# Patient Record
Sex: Male | Born: 1950
Health system: Southern US, Community
[De-identification: ages and names within clinical notes are randomized; demographics above are authoritative.]

## PROBLEM LIST (undated history)

## (undated) DIAGNOSIS — N200 Calculus of kidney: Secondary | ICD-10-CM

## (undated) DIAGNOSIS — R05 Cough: Secondary | ICD-10-CM

## (undated) DIAGNOSIS — L919 Hypertrophic disorder of the skin, unspecified: Secondary | ICD-10-CM

## (undated) DIAGNOSIS — R7309 Other abnormal glucose: Secondary | ICD-10-CM

## (undated) DIAGNOSIS — M26609 Unspecified temporomandibular joint disorder, unspecified side: Secondary | ICD-10-CM

## (undated) DIAGNOSIS — IMO0002 Reserved for concepts with insufficient information to code with codable children: Secondary | ICD-10-CM

## (undated) DIAGNOSIS — IMO0001 Reserved for inherently not codable concepts without codable children: Secondary | ICD-10-CM

## (undated) DIAGNOSIS — R5383 Other fatigue: Secondary | ICD-10-CM

## (undated) DIAGNOSIS — K210 Gastro-esophageal reflux disease with esophagitis: Secondary | ICD-10-CM

## (undated) DIAGNOSIS — G4730 Sleep apnea, unspecified: Secondary | ICD-10-CM

## (undated) DIAGNOSIS — L909 Atrophic disorder of skin, unspecified: Secondary | ICD-10-CM

## (undated) DIAGNOSIS — E785 Hyperlipidemia, unspecified: Secondary | ICD-10-CM

## (undated) DIAGNOSIS — K219 Gastro-esophageal reflux disease without esophagitis: Secondary | ICD-10-CM

## (undated) DIAGNOSIS — R1319 Other dysphagia: Secondary | ICD-10-CM

## (undated) DIAGNOSIS — R5381 Other malaise: Secondary | ICD-10-CM

## (undated) DIAGNOSIS — T7840XA Allergy, unspecified, initial encounter: Secondary | ICD-10-CM

## (undated) DIAGNOSIS — G4733 Obstructive sleep apnea (adult) (pediatric): Secondary | ICD-10-CM

## (undated) DIAGNOSIS — I10 Essential (primary) hypertension: Secondary | ICD-10-CM

## (undated) DIAGNOSIS — E119 Type 2 diabetes mellitus without complications: Secondary | ICD-10-CM

## (undated) DIAGNOSIS — G4700 Insomnia, unspecified: Secondary | ICD-10-CM

## (undated) DIAGNOSIS — L719 Rosacea, unspecified: Secondary | ICD-10-CM

## (undated) DIAGNOSIS — K760 Fatty (change of) liver, not elsewhere classified: Secondary | ICD-10-CM

## (undated) HISTORY — PX: TONSILLECTOMY AND ADENOIDECTOMY: SUR1326

## (undated) HISTORY — DX: Hyperlipidemia, unspecified: E78.5

## (undated) HISTORY — DX: Atrophic disorder of skin, unspecified: L90.9

## (undated) HISTORY — DX: Unspecified temporomandibular joint disorder, unspecified side: M26.609

## (undated) HISTORY — DX: Other abnormal glucose: R73.09

## (undated) HISTORY — PX: COLONOSCOPY: SHX174

## (undated) HISTORY — DX: Obstructive sleep apnea (adult) (pediatric): G47.33

## (undated) HISTORY — DX: Calculus of kidney: N20.0

## (undated) HISTORY — DX: Essential (primary) hypertension: I10

## (undated) HISTORY — DX: Insomnia, unspecified: G47.00

## (undated) HISTORY — DX: Allergy, unspecified, initial encounter: T78.40XA

## (undated) HISTORY — DX: Hypertrophic disorder of the skin, unspecified: L91.9

## (undated) HISTORY — PX: KIDNEY STONE SURGERY: SHX686

## (undated) HISTORY — DX: Sleep apnea, unspecified: G47.30

## (undated) HISTORY — DX: Gastro-esophageal reflux disease with esophagitis: K21.0

## (undated) HISTORY — DX: Rosacea, unspecified: L71.9

## (undated) HISTORY — DX: Other malaise: R53.81

## (undated) HISTORY — DX: Type 2 diabetes mellitus without complications: E11.9

## (undated) HISTORY — DX: Other fatigue: R53.83

## (undated) HISTORY — DX: Cough: R05

## (undated) HISTORY — DX: Gastro-esophageal reflux disease without esophagitis: K21.9

## (undated) HISTORY — DX: Fatty (change of) liver, not elsewhere classified: K76.0

## (undated) HISTORY — DX: Reserved for concepts with insufficient information to code with codable children: IMO0002

## (undated) HISTORY — PX: ESOPHAGOGASTRODUODENOSCOPY: SHX1529

## (undated) HISTORY — DX: Reserved for inherently not codable concepts without codable children: IMO0001

## (undated) HISTORY — DX: Other dysphagia: R13.19

---

## 2002-06-30 ENCOUNTER — Encounter: Admission: RE | Admit: 2002-06-30 | Discharge: 2002-06-30 | Payer: Self-pay | Admitting: Emergency Medicine

## 2002-06-30 ENCOUNTER — Encounter: Payer: Self-pay | Admitting: Emergency Medicine

## 2004-01-31 ENCOUNTER — Encounter: Payer: Self-pay | Admitting: Pulmonary Disease

## 2004-01-31 ENCOUNTER — Ambulatory Visit (HOSPITAL_BASED_OUTPATIENT_CLINIC_OR_DEPARTMENT_OTHER): Admission: RE | Admit: 2004-01-31 | Discharge: 2004-01-31 | Payer: Self-pay | Admitting: Internal Medicine

## 2004-05-26 ENCOUNTER — Ambulatory Visit (HOSPITAL_BASED_OUTPATIENT_CLINIC_OR_DEPARTMENT_OTHER): Admission: RE | Admit: 2004-05-26 | Discharge: 2004-05-26 | Payer: Self-pay | Admitting: Pulmonary Disease

## 2005-11-10 ENCOUNTER — Encounter: Admission: RE | Admit: 2005-11-10 | Discharge: 2005-11-10 | Payer: Self-pay | Admitting: Family Medicine

## 2005-11-20 ENCOUNTER — Encounter: Admission: RE | Admit: 2005-11-20 | Discharge: 2005-11-20 | Payer: Self-pay | Admitting: Family Medicine

## 2009-02-14 ENCOUNTER — Ambulatory Visit: Payer: Self-pay | Admitting: Family Medicine

## 2009-02-14 LAB — CONVERTED CEMR LAB
ALT: 51 units/L (ref 0–53)
Basophils Relative: 0.6 % (ref 0.0–3.0)
Bilirubin, Direct: 0 mg/dL (ref 0.0–0.3)
Chloride: 108 meq/L (ref 96–112)
Eosinophils Relative: 10.3 % — ABNORMAL HIGH (ref 0.0–5.0)
Glucose, Urine, Semiquant: NEGATIVE
HCT: 44.1 % (ref 39.0–52.0)
Hemoglobin: 15.5 g/dL (ref 13.0–17.0)
Lymphs Abs: 1.9 10*3/uL (ref 0.7–4.0)
MCV: 92.8 fL (ref 78.0–100.0)
Microalb Creat Ratio: 11.5 mg/g (ref 0.0–30.0)
Monocytes Absolute: 0.6 10*3/uL (ref 0.1–1.0)
Neutrophils Relative %: 50.2 % (ref 43.0–77.0)
PSA: 0.9 ng/mL (ref 0.10–4.00)
Potassium: 3.7 meq/L (ref 3.5–5.1)
RBC: 4.75 M/uL (ref 4.22–5.81)
Sodium: 142 meq/L (ref 135–145)
Total CHOL/HDL Ratio: 5
Total Protein: 6.6 g/dL (ref 6.0–8.3)
Urobilinogen, UA: 0.2
VLDL: 42 mg/dL — ABNORMAL HIGH (ref 0.0–40.0)
WBC: 6.5 10*3/uL (ref 4.5–10.5)

## 2009-02-21 ENCOUNTER — Ambulatory Visit: Payer: Self-pay | Admitting: Family Medicine

## 2009-02-21 DIAGNOSIS — N200 Calculus of kidney: Secondary | ICD-10-CM | POA: Insufficient documentation

## 2009-02-21 DIAGNOSIS — R7309 Other abnormal glucose: Secondary | ICD-10-CM

## 2009-02-21 DIAGNOSIS — L719 Rosacea, unspecified: Secondary | ICD-10-CM

## 2009-02-21 DIAGNOSIS — E785 Hyperlipidemia, unspecified: Secondary | ICD-10-CM | POA: Insufficient documentation

## 2009-02-21 DIAGNOSIS — IMO0002 Reserved for concepts with insufficient information to code with codable children: Secondary | ICD-10-CM

## 2009-02-21 DIAGNOSIS — I10 Essential (primary) hypertension: Secondary | ICD-10-CM | POA: Insufficient documentation

## 2009-02-21 HISTORY — DX: Rosacea, unspecified: L71.9

## 2009-02-21 HISTORY — DX: Calculus of kidney: N20.0

## 2009-02-21 HISTORY — DX: Other abnormal glucose: R73.09

## 2009-02-21 HISTORY — DX: Hyperlipidemia, unspecified: E78.5

## 2009-02-21 HISTORY — DX: Reserved for concepts with insufficient information to code with codable children: IMO0002

## 2009-02-22 ENCOUNTER — Encounter: Payer: Self-pay | Admitting: Family Medicine

## 2009-03-07 ENCOUNTER — Ambulatory Visit: Payer: Self-pay | Admitting: Family Medicine

## 2009-03-07 DIAGNOSIS — L919 Hypertrophic disorder of the skin, unspecified: Secondary | ICD-10-CM

## 2009-03-07 DIAGNOSIS — L909 Atrophic disorder of skin, unspecified: Secondary | ICD-10-CM | POA: Insufficient documentation

## 2009-03-28 ENCOUNTER — Telehealth: Payer: Self-pay | Admitting: *Deleted

## 2009-04-12 ENCOUNTER — Ambulatory Visit: Payer: Self-pay | Admitting: Family Medicine

## 2009-04-12 DIAGNOSIS — R059 Cough, unspecified: Secondary | ICD-10-CM | POA: Insufficient documentation

## 2009-04-12 DIAGNOSIS — R05 Cough: Secondary | ICD-10-CM

## 2009-04-12 HISTORY — DX: Cough, unspecified: R05.9

## 2009-05-13 ENCOUNTER — Ambulatory Visit: Payer: Self-pay | Admitting: Family Medicine

## 2009-05-13 DIAGNOSIS — IMO0001 Reserved for inherently not codable concepts without codable children: Secondary | ICD-10-CM | POA: Insufficient documentation

## 2009-05-13 DIAGNOSIS — K2100 Gastro-esophageal reflux disease with esophagitis, without bleeding: Secondary | ICD-10-CM

## 2009-05-13 HISTORY — DX: Reserved for inherently not codable concepts without codable children: IMO0001

## 2009-05-13 HISTORY — DX: Gastro-esophageal reflux disease with esophagitis, without bleeding: K21.00

## 2009-05-14 ENCOUNTER — Telehealth: Payer: Self-pay | Admitting: Internal Medicine

## 2009-05-15 ENCOUNTER — Ambulatory Visit: Payer: Self-pay | Admitting: Internal Medicine

## 2009-05-15 DIAGNOSIS — R1319 Other dysphagia: Secondary | ICD-10-CM

## 2009-05-15 HISTORY — DX: Other dysphagia: R13.19

## 2009-05-16 ENCOUNTER — Encounter: Payer: Self-pay | Admitting: Internal Medicine

## 2009-05-16 ENCOUNTER — Ambulatory Visit: Payer: Self-pay | Admitting: Internal Medicine

## 2009-05-23 ENCOUNTER — Encounter: Payer: Self-pay | Admitting: Internal Medicine

## 2009-06-06 ENCOUNTER — Ambulatory Visit: Payer: Self-pay | Admitting: Family Medicine

## 2009-06-06 LAB — CONVERTED CEMR LAB
BUN: 16 mg/dL (ref 6–23)
Chloride: 105 meq/L (ref 96–112)
Hgb A1c MFr Bld: 6.1 % (ref 4.6–6.5)
Potassium: 4.1 meq/L (ref 3.5–5.1)

## 2009-06-10 ENCOUNTER — Ambulatory Visit: Payer: Self-pay | Admitting: Family Medicine

## 2009-06-10 DIAGNOSIS — M26609 Unspecified temporomandibular joint disorder, unspecified side: Secondary | ICD-10-CM

## 2009-06-10 HISTORY — DX: Unspecified temporomandibular joint disorder, unspecified side: M26.609

## 2009-07-26 ENCOUNTER — Telehealth: Payer: Self-pay | Admitting: Family Medicine

## 2009-12-31 ENCOUNTER — Telehealth: Payer: Self-pay | Admitting: Family Medicine

## 2010-02-11 ENCOUNTER — Telehealth: Payer: Self-pay | Admitting: Family Medicine

## 2010-03-07 ENCOUNTER — Ambulatory Visit: Payer: Self-pay | Admitting: Family Medicine

## 2010-03-07 LAB — CONVERTED CEMR LAB
Alkaline Phosphatase: 67 units/L (ref 39–117)
BUN: 20 mg/dL (ref 6–23)
Basophils Relative: 0.9 % (ref 0.0–3.0)
Bilirubin Urine: NEGATIVE
Bilirubin, Direct: 0.1 mg/dL (ref 0.0–0.3)
Blood in Urine, dipstick: NEGATIVE
Calcium: 9.1 mg/dL (ref 8.4–10.5)
Chloride: 107 meq/L (ref 96–112)
Cholesterol: 161 mg/dL (ref 0–200)
Creatinine, Ser: 0.8 mg/dL (ref 0.4–1.5)
Direct LDL: 98.5 mg/dL
Eosinophils Absolute: 0.4 10*3/uL (ref 0.0–0.7)
Eosinophils Relative: 5.9 % — ABNORMAL HIGH (ref 0.0–5.0)
Glucose, Urine, Semiquant: NEGATIVE
HDL: 27.4 mg/dL — ABNORMAL LOW (ref >39.00)
Hgb A1c MFr Bld: 6.1 % (ref 4.6–6.5)
Ketones, urine, test strip: NEGATIVE
Lymphocytes Relative: 30.5 % (ref 12.0–46.0)
MCHC: 34.6 g/dL (ref 30.0–36.0)
MCV: 93.1 fL (ref 78.0–100.0)
Microalb Creat Ratio: 0.9 mg/g (ref 0.0–30.0)
Microalb, Ur: 1.1 mg/dL (ref 0.0–1.9)
Monocytes Absolute: 0.6 10*3/uL (ref 0.1–1.0)
Neutrophils Relative %: 53.2 % (ref 43.0–77.0)
PSA: 0.83 ng/mL (ref 0.10–4.00)
Platelets: 221 10*3/uL (ref 150.0–400.0)
Protein, U semiquant: NEGATIVE
RBC: 4.63 M/uL (ref 4.22–5.81)
Total Bilirubin: 0.9 mg/dL (ref 0.3–1.2)
Total CHOL/HDL Ratio: 6
Triglycerides: 268 mg/dL — ABNORMAL HIGH (ref 0.0–149.0)
Urobilinogen, UA: 0.2
VLDL: 53.6 mg/dL — ABNORMAL HIGH (ref 0.0–40.0)
WBC: 6.3 10*3/uL (ref 4.5–10.5)
pH: 5.5

## 2010-03-14 ENCOUNTER — Ambulatory Visit: Payer: Self-pay | Admitting: Family Medicine

## 2010-03-14 DIAGNOSIS — R5381 Other malaise: Secondary | ICD-10-CM

## 2010-03-14 DIAGNOSIS — R5383 Other fatigue: Secondary | ICD-10-CM

## 2010-03-14 HISTORY — DX: Other malaise: R53.81

## 2010-04-02 ENCOUNTER — Ambulatory Visit: Payer: Self-pay | Admitting: Pulmonary Disease

## 2010-04-02 DIAGNOSIS — G4733 Obstructive sleep apnea (adult) (pediatric): Secondary | ICD-10-CM

## 2010-04-02 DIAGNOSIS — G4700 Insomnia, unspecified: Secondary | ICD-10-CM

## 2010-04-02 HISTORY — DX: Insomnia, unspecified: G47.00

## 2010-04-02 HISTORY — DX: Obstructive sleep apnea (adult) (pediatric): G47.33

## 2010-04-08 ENCOUNTER — Telehealth (INDEPENDENT_AMBULATORY_CARE_PROVIDER_SITE_OTHER): Payer: Self-pay | Admitting: *Deleted

## 2010-04-23 ENCOUNTER — Encounter: Payer: Self-pay | Admitting: Pulmonary Disease

## 2010-05-07 ENCOUNTER — Encounter: Payer: Self-pay | Admitting: Pulmonary Disease

## 2010-06-06 ENCOUNTER — Ambulatory Visit: Payer: Self-pay | Admitting: Family Medicine

## 2010-07-08 ENCOUNTER — Encounter: Payer: Self-pay | Admitting: Pulmonary Disease

## 2010-08-22 ENCOUNTER — Ambulatory Visit: Payer: Self-pay | Admitting: Family Medicine

## 2010-08-22 LAB — CONVERTED CEMR LAB
Ketones, urine, test strip: NEGATIVE
Protein, U semiquant: >=300
Specific Gravity, Urine: >=1.03
Urobilinogen, UA: 0.2
pH: 6

## 2010-08-23 ENCOUNTER — Encounter: Payer: Self-pay | Admitting: Family Medicine

## 2010-08-25 ENCOUNTER — Telehealth: Payer: Self-pay | Admitting: Family Medicine

## 2010-09-03 ENCOUNTER — Telehealth: Payer: Self-pay | Admitting: Family Medicine

## 2010-11-18 NOTE — Letter (Signed)
SummaryScience writer Pulmonary Care Appointment Letter  [facility]  [address]   [place], [place] [address]   Phone: [phone]  Fax: [phone]    07/08/2010 MRN: [id number]  [facility] [address] [place], [place]  [address]  Dear Mr. Bree,   Our office is attempting to contact you about an appointment.  Please call our office at [phone] to schedule this appointment with Dr.__Sood__.  Our registration staff is prepared to assist you with any questions you may have.    Thank you,   Nature conservation officer Pulmonary Division

## 2010-11-18 NOTE — Assessment & Plan Note (Signed)
Summary: congestion-fever//ccm   Vital Signs:  Patient profile:   60 year old male Temp:     99.4 degrees F oral BP sitting:   140 / 80  (left arm) Cuff size:   large  Vitals Entered By: Sid Falcon LPN (August 22, 2010 2:24 PM)  History of Present Illness: Patient is seen with 2 day history of dysuria. Slow urinary stream. Intermittent sweats and low-grade fever. Some burning with urination. One episode of nonbloody diarrhea this morning. No nausea or vomiting. No penile discharge. CBG stable with fasting around 120. No history of BPH  does have history of prior kidney stones but no recent back pain.  Allergies (verified): No Known Drug Allergies  Past History:  Past Medical History: Last updated: 04/07/2010 Diabetes Hyperlipidemia Hypertension Kidney Stones OSA      - PSG 01/31/04 RDI 35, oxygen nadir 86% PMH reviewed for relevance  Review of Systems  The patient denies anorexia, weight loss, abdominal pain, hematuria, and incontinence.    Physical Exam  General:  Well-developed,well-nourished,in no acute distress; alert,appropriate and cooperative throughout examination Lungs:  Normal respiratory effort, chest expands symmetrically. Lungs are clear to auscultation, no crackles or wheezes. Heart:  normal rate and regular rhythm.   Prostate:  Prostate gland firm and smooth, no enlargement, nodularity, tenderness, mass, asymmetry or induration.   Impression & Recommendations:  Problem # 1:  DYSURIA (ICD-788.1)  doubt prostatitis clinically by exam. Suspect acute cystitis. Urine culture. Start Cipro 500 mg b.i.d. for 7 days  His updated medication list for this problem includes:    Ciprofloxacin Hcl 500 Mg Tabs (Ciprofloxacin hcl) ..... One by mouth two times a day for 7 days  Orders: UA Dipstick w/o Micro (manual) ([address]) T-Culture, Urine ([address]11)  Complete Medication List: 1)  Aspirin 81 Mg Tabs (Aspirin) .Marland Kitchen.. 1 by mouth daily 2)  Cozaar 100 Mg  Tabs (Losartan potassium) .... Take 1 tablet by mouth every morning 3)  Lipitor 20 Mg Tabs (Atorvastatin calcium) .... Take one tab by mouth at bedtime 4)  Fish Oil 1000 Mg Caps (Omega-3 fatty acids) .Marland Kitchen.. 1 by mouth daily 5)  Pantoprazole Sodium 40 Mg Tbec (Pantoprazole sodium) .Marland Kitchen.. 1 by mouth 30 mins before breakfast 6)  Glucophage 1000 Mg Tabs (Metformin hcl) .... Take 1 tablet by mouth two times a day 7)  Daily Mens Health Formula Tabs (Multiple vitamins-minerals) .Marland Kitchen.. 1 by mouth daily 8)  Loratadine 10 Mg Tabs (Loratadine) .Marland Kitchen.. 1 by mouth once daily 9)  Ambien 5 Mg Tabs (Zolpidem tartrate) .Marland Kitchen.. 1 tab @ bedtime as needed 10)  Freestyle Lite Test Strp (Glucose blood) .... Use to test glucose daily or as directed by your doctor 11)  Ciprofloxacin Hcl 500 Mg Tabs (Ciprofloxacin hcl) .... One by mouth two times a day for 7 days  Patient Instructions: 1)  Drink plenty of fluids up to 3-4 quarts a day. Cranberry juice is especially recommended in addition to large amounts of water. Avoid caffeine & carbonated drinks, they tend to irritate the bladder, Return in 3-5 days if you're not better: sooner if you're feeling worse.  Prescriptions: CIPROFLOXACIN HCL 500 MG TABS (CIPROFLOXACIN HCL) one by mouth two times a day for 7 days  #14 x 0   Entered and Authorized by:   Evelena Peat MD   Signed by:   Evelena Peat MD on 08/22/2010   Method used:   [facility] [address]  [phone]  [address] [place], [place]  [address]       Ph: [phone] or [phone]       Fax: [phone]   RxID:   [id number]    Orders Added: 1)  UA Dipstick w/o Micro (manual) [[address]1002] 2)  T-Culture, Urine [[id number]] 3)  Est. Patient Level III [[address]]    Laboratory Results   Urine Tests    Routine Urinalysis   Color: orange Appearance: Hazy Glucose: negative   (Normal Range: Negative) Bilirubin: negative   (Normal Range: Negative) Ketone: negative    (Normal Range: Negative) Spec. Gravity: >=1.030   (Normal Range: 1.003-1.035) Blood: large   (Normal Range: Negative) pH: 6.0   (Normal Range: 5.0-8.0) Protein: >=300   (Normal Range: Negative) Urobilinogen: 0.2   (Normal Range: 0-1) Nitrite: positive   (Normal Range: Negative) Leukocyte Esterace: trace   (Normal Range: Negative)    Comments: Sid Falcon LPN  August 22, 2010 2:50 PM

## 2010-11-18 NOTE — Miscellaneous (Signed)
Summary: auto CPAP download 04/22/10 to 04/23/10  Clinical Lists Changes Used on 2 of 2 nights with average 1hr 59 min.  Optimal pressure 12 cm with average AHI 9.  Did have airleak.  Will have my nurse call to see if patient is interested in following up for sleep apnea, and if so proceed with scheduling ROV to assess status.  Appended Document: auto CPAP download 04/22/10 to 04/23/10 LMOMTCB.  Appended Document: auto CPAP download 04/22/10 to 04/23/10 LMOMTCB.  Appended Document: auto CPAP download 04/22/10 to 04/23/10 LMOMTCB.  Appended Document: auto CPAP download 04/22/10 to 04/23/10 Letter mailed to pt's home address requesting that he call our office for follow-up appt with Dr. Craige Cotta.

## 2010-11-18 NOTE — Progress Notes (Signed)
Summary: medical supplier  Phone Note Call from Patient Call back at [phone]   Caller: Patient Call For: sood Summary of Call: pt would like local medical supplier , he does not want to go to [place] Initial call taken by: Rickard Patience,  April 08, 2010 1:29 PM  Follow-up for Phone Call        called and spoke with pt.  pt states he see VS for OSA.  Pt states he was recently set up with [employer] as his DME company  (see orders in EMR) .  Pt states this company is in [place] and pt states this is too far and perfers someone in [place] as close to [facility] as possible.  Will forward message to [facility] to address.  Aundra Millet Reynolds LPN  April 08, 2010 2:01 PM   Additional Follow-up for Phone Call Additional follow up Details #1::        lmtcb with millie/spoke to [facility] she will call this pt and talk to him about someone coming to him for cpap auto  Additional Follow-up by: Oneita Jolly,  April 08, 2010 4:54 PM

## 2010-11-18 NOTE — Progress Notes (Signed)
Summary: Pt lost script for Cipro. Pls call in #18   Phone Note Call from Patient   Caller: spouse - pam Summary of Call: Pt script for cipro. [facility]s [facility]  Initial call taken by: Lucy Antigua,  September 03, 2010 3:31 PM  Follow-up for Phone Call        Cipro 500 mg, dispensed 20 tabs, directions one p.o. b.i.d. to a bottle empty.  No refills Follow-up by: Roderick Pee MD,  September 04, 2010 10:25 AM

## 2010-11-18 NOTE — Progress Notes (Signed)
Summary: shoulder concerns  Phone Note Call from Patient   Caller: Patient Summary of Call: patient is calling because he went to dr whitfield and he was dx a "frozen shoulder" after a MRI.  the steroid shot did not help.  his 2 options are 1- "be put to sleep and have the shoulder break loose"  or 2 - physical therapy.  he would like to know what dr Jr Milliron suggests.  his number is [phone] Initial call taken by: Kern Reap CMA Duncan Dull),  February 11, 2010 4:28 PM  Follow-up for Phone Call        try physical therapy first Follow-up by: Roderick Pee MD,  February 12, 2010 8:30 AM  Additional Follow-up for Phone Call Additional follow up Details #1::        Phone Call Completed Additional Follow-up by: Kern Reap CMA Duncan Dull),  February 12, 2010 11:49 AM

## 2010-11-18 NOTE — Progress Notes (Signed)
Summary: wants to speak with Fleet Contras  Call back at [phone]   Caller: Patient---live call Reason for Call: Talk to Nurse Summary of Call: pt is having a problem with his arm and wants Fleet Contras to return call. No further message. Refused to speak with the Triage Nurse. Initial call taken by: Warnell Forester,  December 31, 2009 11:02 AM  Follow-up for Phone Call        patient is complaining of pain.  no all the time and not with lifiting.  it only hurts when moving a certain way.  he's not sure if it is the medication.  any suggestions? Follow-up by: Kern Reap CMA Duncan Dull),  December 31, 2009 1:07 PM    Additional Follow-up for Phone Call Additional follow up Details #2::    referred to Dr. Cleophas Dunker for orthopedic evaluation Follow-up by: Roderick Pee MD,  December 31, 2009 1:30 PM

## 2010-11-18 NOTE — Progress Notes (Signed)
Summary: cipro refill  Phone Note Refill Request Message from:  Fax from Pharmacy on August 25, 2010 5:19 PM  Refills Requested: Medication #1:  CIPROFLOXACIN HCL 500 MG TABS one by mouth two times a day for 7 days. Initial call taken by: Kern Reap CMA Duncan Dull),  August 25, 2010 5:19 PM    Prescriptions: CIPROFLOXACIN HCL 500 MG TABS (CIPROFLOXACIN HCL) one by mouth two times a day for 7 days  #28 x 0   Entered by:   Kern Reap CMA (AAMA)   Authorized by:   Roderick Pee MD   Signed by:   Kern Reap CMA (AAMA) on 08/25/2010   Method used:   [facility]        [facility]  [phone]       [address] [place], [place]  [address]       Ph: [phone] or [phone]       Fax: [phone]   RxID:   [id number]

## 2010-11-18 NOTE — Assessment & Plan Note (Signed)
Summary: CPX//SLM   Vital Signs:  Patient profile:   60 year old male Height:      72.5 inches Weight:      246 pounds BMI:     33.02 Temp:     97.9 degrees F oral BP sitting:   160 / 88  (left arm) Cuff size:   regular  Vitals Entered By: Kern Reap CMA Duncan Dull) (Mar 14, 2010 1:57 PM) CC: cpx   CC:  cpx.  History of Present Illness: Derrick Brown is a 60 year old, married male, nonsmoker, who comes in today for evaluation of hypertension, diabetes, and a new problem of daytime fatigue.  His diabetes is treated with Glucophage 1000 mg b.i.d. fasting blood sugar 135, however, hemoglobin A1c 6.1%.  He takes Cozaar 100 mg daily for hypertension.  BP at home 135/80.  He takes a PPI from Dr. Leone Payor for reflux esophagitis.  He also takes Ambien 5 mg one half tab nightly p.r.n. for sleep dysfunction, and Claritin OTC for allergic rhinitis.  He also takes 181 mg baby aspirin and Lipitor 20 mg Monday, and Friday.  Lipids are goal.  He states for the past couple months.  He said some daytime fatigue.  It typically occurs around 4 o'clock in the afternoon.  By the time he gets home at 7.  He has  to take a nap.  He then eats dinner goes to bed around 10 or 11 and states he sleeps all night.  About 4 to 5 years ago.  He had an evaluation and was told he had sleep apnea.  He is not been using his CPAP.  When he began to feel fatigued.  He thought that might be the culprit therefore restart his CPAP, however, it's not helped.  Tetanus 2007  Allergies: No Known Drug Allergies  Past History:  Past medical, surgical, family and social histories (including risk factors) reviewed, and no changes noted (except as noted below).  Past Medical History: Reviewed history from 05/15/2009 and no changes required. Diabetes Hyperlipidemia Hypertension Kidney Stones  Past Surgical History: Reviewed history from 02/21/2009 and no changes required. Kidney Stones removed  Family History: Reviewed history  from 02/21/2009 and no changes required. father died of an MI at age 47.  Mother in good health.  One brother in good health.  One sister with colon polyps  Social History: Reviewed history from 05/15/2009 and no changes required. Married Former Smoker Alcohol use-yes Drug use-no Regular exercise-yes Occupation:  Musician  Review of Systems      See HPI  Physical Exam  General:  Well-developed,well-nourished,in no acute distress; alert,appropriate and cooperative throughout examination Head:  Normocephalic and atraumatic without obvious abnormalities. No apparent alopecia or balding. Eyes:  No corneal or conjunctival inflammation noted. EOMI. Perrla. Funduscopic exam benign, without hemorrhages, exudates or papilledema. Vision grossly normal. Ears:  External ear exam shows no significant lesions or deformities.  Otoscopic examination reveals clear canals, tympanic membranes are intact bilaterally without bulging, retraction, inflammation or discharge. Hearing is grossly normal bilaterally. Nose:  External nasal examination shows no deformity or inflammation. Nasal mucosa are pink and moist without lesions or exudates. Mouth:  Oral mucosa and oropharynx without lesions or exudates.  Teeth in good repair. Neck:  No deformities, masses, or tenderness noted. Chest Wall:  No deformities, masses, tenderness or gynecomastia noted. Breasts:  No masses or gynecomastia noted Lungs:  Normal respiratory effort, chest expands symmetrically. Lungs are clear to auscultation, no crackles or wheezes. Heart:  Normal rate and regular rhythm. S1 and S2 normal without gallop, murmur, click, rub or other extra sounds. Abdomen:  Bowel sounds positive,abdomen soft and non-tender without masses, organomegaly or hernias noted. Rectal:  No external abnormalities noted. Normal sphincter tone. No rectal masses or tenderness. Genitalia:  Testes bilaterally descended without nodularity,  tenderness or masses. No scrotal masses or lesions. No penis lesions or urethral discharge. Prostate:  Prostate gland firm and smooth, no enlargement, nodularity, tenderness, mass, asymmetry or induration. Msk:  No deformity or scoliosis noted of thoracic or lumbar spine.   Pulses:  R and L carotid,radial,femoral,dorsalis pedis and posterior tibial pulses are full and equal bilaterally Extremities:  No clubbing, cyanosis, edema, or deformity noted with normal full range of motion of all joints.   Neurologic:  No cranial nerve deficits noted. Station and gait are normal. Plantar reflexes are down-going bilaterally. DTRs are symmetrical throughout. Sensory, motor and coordinative functions appear intact. Skin:  Intact without suspicious lesions or rashes Cervical Nodes:  No lymphadenopathy noted Axillary Nodes:  No palpable lymphadenopathy Inguinal Nodes:  No significant adenopathy Psych:  Cognition and judgment appear intact. Alert and cooperative with normal attention span and concentration. No apparent delusions, illusions, hallucinations   Problems:  Medical Problems Added: 1)  Dx of Fatigue  (ICD-780.79)  Impression & Recommendations:  Problem # 1:  HYPERTENSION NEC (ICD-997.91) Assessment Improved  Orders: EKG w/ Interpretation ([address])  Problem # 2:  HYPERGLYCEMIA (ICD-790.29) Assessment: Improved  His updated medication list for this problem includes:    Glucophage 1000 Mg Tabs (Metformin hcl) .Marland Kitchen... Take 1 tablet by mouth two times a day  Problem # 3:  HYPERLIPIDEMIA (ICD-272.4) Assessment: Improved  His updated medication list for this problem includes:    Lipitor 20 Mg Tabs (Atorvastatin calcium) .Marland Kitchen... Take one tab by mouth at bedtime  Orders: EKG w/ Interpretation ([address])  Problem # 4:  Preventive Health Care (ICD-V70.0) Assessment: Unchanged  Problem # 5:  FATIGUE (ICD-780.79) Assessment: New  Orders: Prescription Created Electronically [phone]) Pulmonary  Referral (Pulmonary)  Complete Medication List: 1)  Fish Oil Oil (Fish oil) 2)  Aspirin 81 Mg Tabs (Aspirin) .... On hold 3)  Daily Mens Health Formula Tabs (Multiple vitamins-minerals) 4)  Loratadine 10 Mg Tabs (Loratadine) .Marland Kitchen.. 1 by mouth once daily 5)  Glucophage 1000 Mg Tabs (Metformin hcl) .... Take 1 tablet by mouth two times a day 6)  Duac Cs 1-5 % Kit (Clindamycin-benzoyl per-cleans) .... Apply qhs 7)  Ambien 5 Mg Tabs (Zolpidem tartrate) .Marland Kitchen.. 1 tab @ bedtime as needed 8)  Freestyle Lite Test Strp (Glucose blood) .... Use to test glucose daily or as directed by your doctor 9)  Cozaar 100 Mg Tabs (Losartan potassium) .... Take 1 tablet by mouth every morning 10)  Pantoprazole Sodium 40 Mg Tbec (Pantoprazole sodium) .Marland Kitchen.. 1 by mouth 30 mins before breakfast 11)  Lipitor 20 Mg Tabs (Atorvastatin calcium) .... Take one tab by mouth at bedtime  Patient Instructions: 1)  check a blood sugar daily, around 4 p.m., fax me the data at [phone].  Next Friday.  Move your evening dose prior to your evening meal.  I will also request a consult from Dr. Stann Mainland concerning the sleep apnea issue. 2)  It is important that you exercise regularly at least 20 minutes 5 times a week. If you develop chest pain, have severe difficulty breathing, or feel very tired , stop exercising immediately and seek medical attention. 3)  Schedule a colonoscopy/sigmoidoscopy to help detect  colon cancer. 4)  Check your blood sugars regularly. If your readings are usually above : or below 70 you should contact our office. 5)  It is important that your Diabetic A1c level is checked every 3 months. 6)  See your eye doctor yearly to check for diabetic eye damage. 7)  Check your feet each night for sore areas, calluses or signs of infection. 8)  Check your Blood Pressure regularly. If it is above: you should make an appointment. 9)  Please schedule a follow-up appointment in 6 months.    250.00 10)  BMP prior to visit,  ICD-9: 11)  HbgA1C prior to visit, ICD-9: Prescriptions: AMBIEN 5 MG TABS (ZOLPIDEM TARTRATE) 1 tab @ bedtime as needed  #50 x 4   Entered and Authorized by:   Roderick Pee MD   Signed by:   Roderick Pee MD on 03/14/2010   Method used:   Print then Give to Patient   RxID:   [id number] LIPITOR 20 MG TABS (ATORVASTATIN CALCIUM) take one tab by mouth at bedtime  #100 x 3   Entered and Authorized by:   Roderick Pee MD   Signed by:   Roderick Pee MD on 03/14/2010   Method used:   [facility]        [facility]  [phone]* (retail)       [address] [place], [place]  [address]       Ph: [phone] or [phone]       Fax: [phone]   RxID:   [id number] COZAAR 100 MG TABS (LOSARTAN POTASSIUM) Take 1 tablet by mouth every morning  #100 x 3   Entered and Authorized by:   Roderick Pee MD   Signed by:   Roderick Pee MD on 03/14/2010   Method used:   [facility]        [facility]  [phone]* (retail)       [address] [place], [place]  [address]       Ph: [phone] or [phone]       Fax: [phone]   RxID:   [id number] FREESTYLE LITE TEST  STRP (GLUCOSE BLOOD) use to test glucose daily or as directed by your doctor  #100 x 3   Entered and Authorized by:   Roderick Pee MD   Signed by:   Roderick Pee MD on 03/14/2010   Method used:   [facility] to        [facility]  [phone]* (retail)       [address] [place], [place]  [address]       Ph: [phone] or [phone]       Fax: [phone]   RxID:   [id number] DUAC CS 1-5 % KIT (CLINDAMYCIN-BENZOYL PER-CLEANS) apply qhs  #45 gr. x 6   Entered and Authorized by:   Roderick Pee MD   Signed by:   Roderick Pee MD on 03/14/2010   Method used:   [facility]ronically to        [facility]  [phone]* (retail)       [address] [place], [place]  [address]       Ph: [phone] or [phone]       Fax: [phone]   RxID:    [id number] GLUCOPHAGE 1000 MG  TABS (METFORMIN HCL) Take 1 tablet by mouth two times a day  #200 x 3   Entered and Authorized by:   Roderick Pee MD   Signed by:   Roderick Pee MD on 03/14/2010   Method used:   [facility]ically to        [facility]  [phone]* (retail)       [address] [place], [place]  [address]       Ph: [phone] or [phone]       Fax: [phone]   RxID:   [id number]

## 2010-11-18 NOTE — Miscellaneous (Signed)
Summary: Polysomnogram  Clinical Lists Changes RDI 35, with oxygen nadir 86%.

## 2010-11-18 NOTE — Medication Information (Signed)
Summary: CPAP Supplies/[facility]  CPAP Supplies/[facility]   Imported By: Sherian Rein 05/13/2010 08:33:43  _____________________________________________________________________  External Attachment:    Type:   Image     Comment:   External Document

## 2010-11-18 NOTE — Assessment & Plan Note (Signed)
Summary: sleep apnea/apc   Visit Type:  Initial Consult Copy to:  Dr. Kelle Darting Primary Provider/Referring Provider:  Dr. Kelle Darting  CC:  Sleep consult.Derrick Brown  History of Present Illness: 60 yo male with sleep apnea.  He was diagnosed with sleep apnea in 2005.  He was started on CPAP, and this helped initially.  He had then lost about 20 lbs, and his sleep improved.  He was no longer snoring either.  As a result he stopped using CPAP, and had not used it for several years.  Since then his weight has increased again.  With the increase in his weight he started snoring again, and had more trouble with his sleep.  His wife commented that his breathing would get shallow at night.  He has also been getting sleepy in the late afternoon.  He still had his CPAP machine, so he started using this again about one month ago.  This has not helped as much as before.  He goes to bed at 10 pm, and will take 2.5 mg of ambien just before bed.  It takes about 30 minutes to fall asleep.  He wakes up once to use the bathroom.  If he does not use ambien he will have trouble falling back to sleep.  He gets out of bed at 7am.  He denies sleep walking, sleep talking, nightmares, or bruxism.  There is no history of restless legs. He denies sleep hallucinations, sleep paralysis, or cataplexy.  He does not use anything to help him stay awake.  There is no history of thyroid disease.  He has not had problems with his mood.  Epworth score is 6 out of 24.    Preventive Screening-Counseling & Management  Alcohol-Tobacco     Alcohol drinks/day: 2     Smoking Status: never  Current Medications (verified): 1)  Lipitor 20 Mg Tabs (Atorvastatin Calcium) .... Take One Tab By Mouth At Bedtime 2)  Cozaar 100 Mg Tabs (Losartan Potassium) .... Take 1 Tablet By Mouth Every Morning 3)  Fish Oil 1000 Mg Caps (Omega-3 Fatty Acids) .Derrick Brown.. 1 By Mouth Daily 4)  Aspirin 81 Mg  Tabs (Aspirin) .Derrick Brown.. 1 By Mouth Daily 5)  Glucophage 1000  Mg Tabs (Metformin Hcl) .... Take 1 Tablet By Mouth Two Times A Day 6)  Pantoprazole Sodium 40 Mg Tbec (Pantoprazole Sodium) .Derrick Brown.. 1 By Mouth 30 Mins Before Breakfast 7)  Daily Mens Health Formula  Tabs (Multiple Vitamins-Minerals) .Derrick Brown.. 1 By Mouth Daily 8)  Loratadine 10 Mg Tabs (Loratadine) .Derrick Brown.. 1 By Mouth Once Daily 9)  Ambien 5 Mg Tabs (Zolpidem Tartrate) .Derrick Brown.. 1 Tab @ Bedtime As Needed 10)  Freestyle Lite Test  Strp (Glucose Blood) .... Use To Test Glucose Daily or As Directed By Your Doctor  Allergies (verified): No Known Drug Allergies  Past History:  Past Medical History: Diabetes Hyperlipidemia Hypertension Kidney Stones OSA  Past Surgical History: Reviewed history from 02/21/2009 and no changes required. Kidney Stones removed  Family History: Reviewed history from 02/21/2009 and no changes required. Father died of an MI at age 43.  Mother in good health.  One brother in good health.  One sister with colon polyps  Social History: Reviewed history from 05/15/2009 and no changes required. Married Former Smoker Alcohol use-yes Drug use-no Regular exercise-yes Occupation:  Musician Smoking Status:  never Alcohol drinks/day:  2  Review of Systems  The patient denies shortness of breath with activity, shortness of breath at rest,  productive cough, non-productive cough, coughing up blood, chest pain, irregular heartbeats, acid heartburn, indigestion, loss of appetite, weight change, abdominal pain, difficulty swallowing, sore throat, tooth/dental problems, headaches, nasal congestion/difficulty breathing through nose, sneezing, itching, ear ache, anxiety, depression, hand/feet swelling, joint stiffness or pain, rash, change in color of mucus, and fever.    Vital Signs:  Patient profile:   60 year old male Height:      72.5 inches (184.15 cm) Weight:      250 pounds (113.64 kg) BMI:     33.56 O2 Sat:      96 % on Room air Temp:     98.3  degrees F (36.83 degrees C) oral Pulse rate:   83 / minute BP sitting:   114 / 80  (right arm) Cuff size:   large  Vitals Entered By: Michel Bickers CMA (April 02, 2010 4:08 PM)  O2 Sat at Rest %:  96 O2 Flow:  Room air CC: Sleep consult. Is Patient Diabetic? Yes Comments Medications reviewed. Daytime phone verified. Michel Bickers CMA  April 02, 2010 4:09 PM   Physical Exam  General:  normal appearance, healthy appearing, and obese.   Eyes:  PERRLA and EOMI.   Ears:  deformity of R pinna.   Nose:  no deformity, discharge, inflammation, or lesions Mouth:  MP 3, no exudate, decreased AP diameter Neck:  no JVD.   Chest Wall:  no deformities noted Lungs:  clear bilaterally to auscultation and percussion Heart:  regular rate and rhythm, S1, S2 without murmurs, rubs, gallops, or clicks Abdomen:  bowel sounds positive; abdomen soft and non-tender without masses, or organomegaly Msk:  no deformity or scoliosis noted with normal posture Pulses:  pulses normal Extremities:  no clubbing, cyanosis, edema, or deformity noted Neurologic:  CN II-XII grossly intact with normal reflexes, coordination, muscle strength and tone Cervical Nodes:  no significant adenopathy Psych:  alert and cooperative; normal mood and affect; normal attention span and concentration   Impression & Recommendations:  Problem # 1:  OBSTRUCTIVE SLEEP APNEA (ICD-327.23) He has a previous diagnosis of sleep apnea.  He was on CPAP therapy with initial improvement.  He had significant weight loss, and with this his sleep pattern improved.  As a result he stopped using CPAP.  Unfortunately, he regained his weight plus more, and with this his sleep problems have recurred.  He therefore likely has recurrence of his sleep apnea.  He started using his CPAP machine again empirically, but has been experiencing difficulty adjusting to his CPAP.  He does have a history of hypertension and diabetes.  I discussed with him how sleep  apnea can affect his health.  Driving precautions were reviewed.  Explained the importance of weight loss in relation to his sleep and his overall health.  Reviewed treatment options for his sleep apnea.    Will arrange for an auto-CPAP titration at home.  Explained to him that if this is unsuccessful he may need an in lab study.  Additionally, since his previous test was several years ago he may require repeat diagnostic testing.  This would be especially so if he wants to pursue the option of an oral appliance.  Will try to get a copy of his previous diagnostic study.  Problem # 2:  INSOMNIA (ICD-780.52) He has sleep maintenance insomnia, and has been on ambien chronically.  He may actually have sleep disruption related to untreated sleep apnea.    Will stabilize his therapy for sleep apnea, and then  re-assess whether he needs to be on chronic hypnotic therapy.  Medications Added to Medication List This Visit: 1)  Aspirin 81 Mg Tabs (Aspirin) .Derrick Brown.. 1 by mouth daily 2)  Fish Oil 1000 Mg Caps (Omega-3 fatty acids) .Derrick Brown.. 1 by mouth daily 3)  Daily Mens Health Formula Tabs (Multiple vitamins-minerals) .Derrick Brown.. 1 by mouth daily  Complete Medication List: 1)  Aspirin 81 Mg Tabs (Aspirin) .Derrick Brown.. 1 by mouth daily 2)  Cozaar 100 Mg Tabs (Losartan potassium) .... Take 1 tablet by mouth every morning 3)  Lipitor 20 Mg Tabs (Atorvastatin calcium) .... Take one tab by mouth at bedtime 4)  Fish Oil 1000 Mg Caps (Omega-3 fatty acids) .Derrick Brown.. 1 by mouth daily 5)  Pantoprazole Sodium 40 Mg Tbec (Pantoprazole sodium) .Derrick Brown.. 1 by mouth 30 mins before breakfast 6)  Glucophage 1000 Mg Tabs (Metformin hcl) .... Take 1 tablet by mouth two times a day 7)  Daily Mens Health Formula Tabs (Multiple vitamins-minerals) .Derrick Brown.. 1 by mouth daily 8)  Loratadine 10 Mg Tabs (Loratadine) .Derrick Brown.. 1 by mouth once daily 9)  Ambien 5 Mg Tabs (Zolpidem tartrate) .Derrick Brown.. 1 tab @ bedtime as needed 10)  Freestyle Lite Test Strp (Glucose blood) .... Use  to test glucose daily or as directed by your doctor  Other Orders: Consultation Level IV (04540) DME Referral (DME)  Patient Instructions: 1)  Will set up test of CPAP pressure at home 2)  Follow up in 6 to 8 weeks

## 2010-12-17 ENCOUNTER — Other Ambulatory Visit: Payer: Self-pay | Admitting: Family Medicine

## 2011-01-20 ENCOUNTER — Telehealth: Payer: Self-pay | Admitting: Family Medicine

## 2011-01-20 NOTE — Telephone Encounter (Signed)
Requesting a new rx for Pantoprazole [facility]mg  [facility].

## 2011-01-21 MED ORDER — PANTOPRAZOLE SODIUM 40 MG PO TBEC: 40.0000 mg | DELAYED_RELEASE_TABLET | Freq: Every day | ORAL | Status: DC

## 2011-01-25 LAB — GLUCOSE, CAPILLARY: Glucose-Capillary: 117 mg/dL — ABNORMAL HIGH (ref 70–99)

## 2011-03-06 NOTE — Procedures (Signed)
Derrick Brown, Derrick Brown                ACCOUNT NO.:  [id number]   MEDICAL RECORD NO.:  [id number]          PATIENT TYPE:  OUT   LOCATION:  SLEEP CENTER                 FACILITY:  [facility]   PHYSICIAN:  Marcelyn Bruins, M.D. [facility] DATE OF BIRTH:  1951-01-28   DATE OF ADMISSION:  05/26/2004  DATE OF DISCHARGE:  05/26/2004                              NOCTURNAL POLYSOMNOGRAM   REFERRING PHYSICIAN:  Dr. Marcelyn Bruins   INDICATION FOR THE STUDY:  Hypersomnia with sleep apnea.  The patient needs  CPAP titration for pressure optimization.   SLEEP ARCHITECTURE:  The patient had a total sleep time of 388 minutes with  adequate amounts of slow wave sleep and REM.  His sleep onset was fairly  quick at 6 minutes and his REM onset latency was 75 minutes.   IMPRESSION:  1. Good control of previously documented obstructive sleep apnea with     continuous positive airway pressure of 8 cm.  There was mild rapid eye     movement breakthrough toward the end of the study, therefore I would     recommend a final treatment pressure of 9-10 cm if tolerated.  The     patient tried a different full face mask in the form of a Teacher, music.  By the end of the study he preferred this to his home     mask.  2. No clinically significant cardiac arrhythmias.  3. Moderate to large numbers of periodic leg movements with mild sleep     disruption.  Clinical correlation is suggested.                                   ______________________________                                Marcelyn Bruins, M.D. [facility]     KC/MEDQ  D:  06/10/2004 10:14:01  T:  06/10/2004 13:23:29  Job:  [id number]

## 2011-03-20 ENCOUNTER — Other Ambulatory Visit (INDEPENDENT_AMBULATORY_CARE_PROVIDER_SITE_OTHER): Payer: BC Managed Care – PPO

## 2011-03-20 DIAGNOSIS — Z0000 Encounter for general adult medical examination without abnormal findings: Secondary | ICD-10-CM

## 2011-03-20 LAB — HEPATIC FUNCTION PANEL
Alkaline Phosphatase: 69 U/L (ref 39–117)
Bilirubin, Direct: 0.2 mg/dL (ref 0.0–0.3)

## 2011-03-20 LAB — LIPID PANEL
HDL: 30.8 mg/dL — ABNORMAL LOW (ref >39.00)
Total CHOL/HDL Ratio: 5
VLDL: 71.2 mg/dL — ABNORMAL HIGH (ref 0.0–40.0)

## 2011-03-20 LAB — CBC WITH DIFFERENTIAL/PLATELET
Basophils Relative: 0.7 % (ref 0.0–3.0)
Eosinophils Relative: 5.2 % — ABNORMAL HIGH (ref 0.0–5.0)
HCT: 42.9 % (ref 39.0–52.0)
Hemoglobin: 15.1 g/dL (ref 13.0–17.0)
Lymphocytes Relative: 35.5 % (ref 12.0–46.0)
Lymphs Abs: 2 10*3/uL (ref 0.7–4.0)
Monocytes Relative: 9.8 % (ref 3.0–12.0)
Neutro Abs: 2.8 10*3/uL (ref 1.4–7.7)
RBC: 4.67 Mil/uL (ref 4.22–5.81)
WBC: 5.7 10*3/uL (ref 4.5–10.5)

## 2011-03-20 LAB — POCT URINALYSIS DIPSTICK
Ketones, UA: NEGATIVE
Protein, UA: NEGATIVE
Spec Grav, UA: 1.015
Urobilinogen, UA: 0.2
pH, UA: 5

## 2011-03-20 LAB — BASIC METABOLIC PANEL
GFR: 129.46 mL/min (ref >60.00)
Glucose, Bld: 155 mg/dL — ABNORMAL HIGH (ref 70–99)
Potassium: 4.1 mEq/L (ref 3.5–5.1)
Sodium: 138 mEq/L (ref 135–145)

## 2011-03-20 LAB — PSA: PSA: 0.94 ng/mL (ref 0.10–4.00)

## 2011-03-25 ENCOUNTER — Encounter: Payer: Self-pay | Admitting: Family Medicine

## 2011-03-26 ENCOUNTER — Ambulatory Visit (INDEPENDENT_AMBULATORY_CARE_PROVIDER_SITE_OTHER): Payer: BC Managed Care – PPO | Admitting: Family Medicine

## 2011-03-26 ENCOUNTER — Encounter: Payer: Self-pay | Admitting: Family Medicine

## 2011-03-26 DIAGNOSIS — N529 Male erectile dysfunction, unspecified: Secondary | ICD-10-CM

## 2011-03-26 DIAGNOSIS — E785 Hyperlipidemia, unspecified: Secondary | ICD-10-CM

## 2011-03-26 DIAGNOSIS — R7309 Other abnormal glucose: Secondary | ICD-10-CM

## 2011-03-26 DIAGNOSIS — IMO0002 Reserved for concepts with insufficient information to code with codable children: Secondary | ICD-10-CM

## 2011-03-26 DIAGNOSIS — K2100 Gastro-esophageal reflux disease with esophagitis, without bleeding: Secondary | ICD-10-CM

## 2011-03-26 DIAGNOSIS — G4700 Insomnia, unspecified: Secondary | ICD-10-CM

## 2011-03-26 MED ORDER — SILDENAFIL CITRATE 50 MG PO TABS: 50.0000 mg | ORAL_TABLET | ORAL | Status: DC | PRN

## 2011-03-26 MED ORDER — METFORMIN HCL 1000 MG PO TABS: ORAL_TABLET | ORAL | Status: DC

## 2011-03-26 MED ORDER — ATORVASTATIN CALCIUM 20 MG PO TABS: 20.0000 mg | ORAL_TABLET | Freq: Every day | ORAL | Status: DC

## 2011-03-26 MED ORDER — LOSARTAN POTASSIUM 100 MG PO TABS: 100.0000 mg | ORAL_TABLET | Freq: Every day | ORAL | Status: DC

## 2011-03-26 MED ORDER — PANTOPRAZOLE SODIUM 40 MG PO TBEC: 40.0000 mg | DELAYED_RELEASE_TABLET | Freq: Every day | ORAL | Status: DC

## 2011-03-26 NOTE — Patient Instructions (Addendum)
Continue your current medications except stop the Ambien and uses Tylenol PM one at bedtime as needed for sleep dysfunction.  If you have the sensation of flushing in the late afternoon check y  blood sugar, if it's low and let us know.  We will call u   The report on your testosterone level.  Viagra 50 mg one half tablet one hour prior to sex with water  Return sometime in the next couple weeks for removal of the lesion.  On your right forehead

## 2011-03-26 NOTE — Progress Notes (Signed)
Subjective:    Patient ID: Derrick Brown, male    DOB: 04/13/1951, 60 y.o.   MRN: [id number]  HPIelmon  Is a 60 year old, married male, nonsmoker, who comes in today for general physical examination because of a history of underlying hypertension, diabetes, hyperlipidemia, reflux esophagitis.  Sleep dysfunction, and a new problem of erectile dysfunction.  His hypertension is treated with Cozaar 100 mg daily.  BP 135/85 at home.  Hyperlipidemia treated with Lipitor 20 mg Monday, and Friday.  Lipids are at goal.  His diabetes is treated with metformin 500 mg b.i.d. Fasting blood sugar 155.  A1c7  No hypoglycemia except he is having some flushing occasionally late afternoon.  Advised to check her blood sugar.  He takes protonic 40 mg daily for chronic reflux.  He takes Ambien p.r.n. For sleep dysfunction.  Advised to stop that and just take Tylenol PM.  Is also having trouble with decreased libido and sexual dysfunction.  Will get a testosterone level.  He also takes an aspirin tablet daily, Claritin OTC for allergic rhinitis.  He gets routine eye care, dental care    Review of Systems  Constitutional: Positive for fatigue.  HENT: Negative.   Eyes: Negative.   Respiratory: Negative.   Cardiovascular: Negative.   Gastrointestinal: Negative.   Genitourinary: Negative.   Musculoskeletal: Negative.   Skin: Negative.   Neurological: Negative.   Hematological: Negative.   Psychiatric/Behavioral: Negative.        Objective:   Physical Exam  Constitutional: He is oriented to person, place, and time. He appears well-developed and well-nourished.  HENT:  Head: Normocephalic and atraumatic.  Right Ear: External ear normal.  Left Ear: External ear normal.  Nose: Nose normal.  Mouth/Throat: Oropharynx is clear and moist.  Eyes: Conjunctivae and EOM are normal. Pupils are equal, round, and reactive to light.  Neck: Normal range of motion. Neck supple. No JVD present. No tracheal  deviation present. No thyromegaly present.  Cardiovascular: Normal rate, regular rhythm, normal heart sounds and intact distal pulses.  Exam reveals no gallop and no friction rub.   No murmur heard. Pulmonary/Chest: Effort normal and breath sounds normal. No stridor. No respiratory distress. He has no wheezes. He has no rales. He exhibits no tenderness.  Abdominal: Soft. Bowel sounds are normal. He exhibits no distension and no mass. There is no tenderness. There is no rebound and no guarding.  Genitourinary: Rectum normal, prostate normal and penis normal. Guaiac negative stool. No penile tenderness.  Musculoskeletal: Normal range of motion. He exhibits no edema and no tenderness.  Lymphadenopathy:    He has no cervical adenopathy.  Neurological: He is alert and oriented to person, place, and time. He has normal reflexes. No cranial nerve deficit. He exhibits normal muscle tone.  Skin: Skin is warm and dry. No rash noted. No erythema. No pallor.       Red lesion, right forehead, advised to return for removal  Psychiatric: He has a normal mood and affect. His behavior is normal. Judgment and thought content normal.          Assessment & Plan:  Healthy male.  Diabetes type II continue metformin 500 b.i.d. Follow-up A1c in 6 months.  Hyperlipidemia.  Continue Lipitor 20 mg Monday, and Friday, and 81-mg daily, aspirin.  Allergic rhinitis.  Continue over-the-counter Claritin.  Hypertension.  Continue Cozaar 100 mg daily.  Reflux esophagitis.  Continue Protonix 40 daily.  Sleep dysfunction.  Stop Ambien use OTC Tylenol PM.  Erectile dysfunction workup  with testosterone ,,,,,,,,,, Viagra

## 2011-04-10 ENCOUNTER — Encounter: Payer: Self-pay | Admitting: *Deleted

## 2011-04-10 ENCOUNTER — Telehealth: Payer: Self-pay | Admitting: *Deleted

## 2011-04-10 DIAGNOSIS — E349 Endocrine disorder, unspecified: Secondary | ICD-10-CM | POA: Insufficient documentation

## 2011-04-10 MED ORDER — TESTOSTERONE 12.5 MG/ACT (1%) TD GEL: 2.0000 | TRANSDERMAL | Status: DC

## 2011-04-10 NOTE — Telephone Encounter (Signed)
Message copied by Trenton Gammon on Fri Apr 10, 2011  9:12 AM ------      Message from: TODD, JEFFREY A      Created: Mon Apr 06, 2011  7:22 AM       Please call testosterone level, low, 161.  Range of normal is 350 to 890,,,,,,,, recommend he began androgel         Two pumps daily with follow-up office visit and testosterone level in 6 weeks

## 2011-04-10 NOTE — Telephone Encounter (Signed)
patient  Is aware and rx called in

## 2011-04-13 ENCOUNTER — Telehealth: Payer: Self-pay | Admitting: Family Medicine

## 2011-04-13 NOTE — Telephone Encounter (Signed)
Pt is sch for a1c on 09/25/11. Pt wants to know if he could get testornone taken then, or does he need to come in sooner. Also, pt is needing Dr Tawanna Cooler or nurse to call [facility] [address] re: testosterone script. Pt said that pharmacy will not fill script.

## 2011-04-13 NOTE — Telephone Encounter (Signed)
rx called in.  Lab added and patient is aware

## 2011-04-15 ENCOUNTER — Encounter: Payer: Self-pay | Admitting: Family Medicine

## 2011-04-15 ENCOUNTER — Ambulatory Visit (INDEPENDENT_AMBULATORY_CARE_PROVIDER_SITE_OTHER): Payer: BC Managed Care – PPO | Admitting: Family Medicine

## 2011-04-15 DIAGNOSIS — L989 Disorder of the skin and subcutaneous tissue, unspecified: Secondary | ICD-10-CM

## 2011-04-15 DIAGNOSIS — L570 Actinic keratosis: Secondary | ICD-10-CM

## 2011-04-15 NOTE — Progress Notes (Signed)
  Subjective:    Patient ID: Derrick Brown, male    DOB: 05-27-1951, 60 y.o.   MRN: [id number]  HPI Derrick Brown Is a delightful, 60 year old man male, nonsmoker, who comes in today for removal of an irritated, red lesion on his right for head.  After informed consent, the lesion was cleaned with alcohol 1% Xylocaine with epinephrine was used for anesthesia.  The lesion was excised with 2-mm margins.  Total 6 mm and the base was cauterized.  Band-Aid was applied.  The lesion was sent for pathologic analysis.  Patient tolerated procedure and no complaints.  Also, a skin tag was removed from his left axillary area gratis   Review of Systems Negative    Objective:   Physical Exam    Procedure see above    Assessment & Plan:  Probable actinic keratosis, sent for pathologic analysis

## 2011-04-23 NOTE — Progress Notes (Signed)
patient  Is aware 

## 2011-08-17 ENCOUNTER — Telehealth: Payer: Self-pay | Admitting: Family Medicine

## 2011-08-17 MED ORDER — TESTOSTERONE 20.25 MG/ACT (1.62%) TD GEL: 2.0000 | Freq: Every day | TRANSDERMAL | Status: DC

## 2011-08-17 NOTE — Telephone Encounter (Signed)
Pt is currently on Testosterone (ANDROGEL PUMP) 1.25 GM/ACT (1%) GEL and is requesting  to have it increased to Testosterone (ANDROGEL PUMP)  1.6 pt can get a discount on this if it is increased. Pt requesting to have a new rx sent in  [facility] [address]

## 2011-09-25 ENCOUNTER — Other Ambulatory Visit (INDEPENDENT_AMBULATORY_CARE_PROVIDER_SITE_OTHER): Payer: BC Managed Care – PPO

## 2011-09-25 DIAGNOSIS — N529 Male erectile dysfunction, unspecified: Secondary | ICD-10-CM

## 2011-09-25 DIAGNOSIS — R7309 Other abnormal glucose: Secondary | ICD-10-CM

## 2011-09-25 LAB — BASIC METABOLIC PANEL
CO2: 27 mEq/L (ref 19–32)
Chloride: 105 mEq/L (ref 96–112)
Creatinine, Ser: 0.7 mg/dL (ref 0.4–1.5)
Potassium: 3.9 mEq/L (ref 3.5–5.1)
Sodium: 140 mEq/L (ref 135–145)

## 2011-09-25 LAB — HEMOGLOBIN A1C: Hgb A1c MFr Bld: 6.8 % — ABNORMAL HIGH (ref 4.6–6.5)

## 2011-09-25 LAB — TESTOSTERONE: Testosterone: 287.27 ng/dL — ABNORMAL LOW (ref 350.00–890.00)

## 2011-11-12 ENCOUNTER — Telehealth: Payer: Self-pay | Admitting: Family Medicine

## 2011-11-12 NOTE — Telephone Encounter (Signed)
Pt called req refill of zolpidem (AMBIEN) [facility] tablet [facility] [address].

## 2011-11-18 MED ORDER — ZOLPIDEM TARTRATE 10 MG PO TABS: 10.0000 mg | ORAL_TABLET | Freq: Every evening | ORAL | Status: DC | PRN

## 2011-11-18 NOTE — Telephone Encounter (Signed)
rx called in

## 2011-12-11 ENCOUNTER — Other Ambulatory Visit: Payer: Self-pay | Admitting: Family Medicine

## 2012-04-11 ENCOUNTER — Other Ambulatory Visit: Payer: Self-pay | Admitting: Family Medicine

## 2012-04-28 ENCOUNTER — Other Ambulatory Visit: Payer: Self-pay | Admitting: Family Medicine

## 2012-07-01 ENCOUNTER — Other Ambulatory Visit (INDEPENDENT_AMBULATORY_CARE_PROVIDER_SITE_OTHER): Payer: BC Managed Care – PPO

## 2012-07-01 DIAGNOSIS — Z79899 Other long term (current) drug therapy: Secondary | ICD-10-CM

## 2012-07-01 DIAGNOSIS — Z0000 Encounter for general adult medical examination without abnormal findings: Secondary | ICD-10-CM

## 2012-07-01 LAB — BASIC METABOLIC PANEL
CO2: 27 mEq/L (ref 19–32)
Chloride: 101 mEq/L (ref 96–112)
Creatinine, Ser: 0.8 mg/dL (ref 0.4–1.5)
Potassium: 4.2 mEq/L (ref 3.5–5.1)
Sodium: 136 mEq/L (ref 135–145)

## 2012-07-01 LAB — CBC WITH DIFFERENTIAL/PLATELET
Basophils Absolute: 0.1 10*3/uL (ref 0.0–0.1)
Basophils Relative: 0.8 % (ref 0.0–3.0)
Eosinophils Absolute: 0.3 10*3/uL (ref 0.0–0.7)
HCT: 45 % (ref 39.0–52.0)
Hemoglobin: 15 g/dL (ref 13.0–17.0)
Lymphs Abs: 2.1 10*3/uL (ref 0.7–4.0)
MCHC: 33.4 g/dL (ref 30.0–36.0)
Monocytes Relative: 10.2 % (ref 3.0–12.0)
Neutro Abs: 3.9 10*3/uL (ref 1.4–7.7)
RDW: 13 % (ref 11.5–14.6)

## 2012-07-01 LAB — HEPATIC FUNCTION PANEL
AST: 29 U/L (ref 0–37)
Albumin: 4.1 g/dL (ref 3.5–5.2)
Total Bilirubin: 0.8 mg/dL (ref 0.3–1.2)

## 2012-07-01 LAB — LIPID PANEL: VLDL: 59.2 mg/dL — ABNORMAL HIGH (ref 0.0–40.0)

## 2012-07-01 LAB — POCT URINALYSIS DIPSTICK
Ketones, UA: NEGATIVE
Leukocytes, UA: NEGATIVE
Nitrite, UA: NEGATIVE
Protein, UA: NEGATIVE

## 2012-07-01 LAB — TSH: TSH: 2.51 u[IU]/mL (ref 0.35–5.50)

## 2012-07-01 LAB — PSA: PSA: 0.91 ng/mL (ref 0.10–4.00)

## 2012-07-01 LAB — LDL CHOLESTEROL, DIRECT: Direct LDL: 95.2 mg/dL

## 2012-07-04 ENCOUNTER — Encounter: Payer: Self-pay | Admitting: Internal Medicine

## 2012-07-07 ENCOUNTER — Ambulatory Visit (INDEPENDENT_AMBULATORY_CARE_PROVIDER_SITE_OTHER): Payer: BC Managed Care – PPO | Admitting: Family Medicine

## 2012-07-07 ENCOUNTER — Encounter: Payer: Self-pay | Admitting: Family Medicine

## 2012-07-07 VITALS — BP 160/90 | Temp 98.8°F | Ht 73.5 in | Wt 244.0 lb

## 2012-07-07 DIAGNOSIS — IMO0002 Reserved for concepts with insufficient information to code with codable children: Secondary | ICD-10-CM

## 2012-07-07 DIAGNOSIS — Z23 Encounter for immunization: Secondary | ICD-10-CM

## 2012-07-07 DIAGNOSIS — E1165 Type 2 diabetes mellitus with hyperglycemia: Secondary | ICD-10-CM | POA: Insufficient documentation

## 2012-07-07 DIAGNOSIS — K2100 Gastro-esophageal reflux disease with esophagitis, without bleeding: Secondary | ICD-10-CM

## 2012-07-07 DIAGNOSIS — E291 Testicular hypofunction: Secondary | ICD-10-CM

## 2012-07-07 DIAGNOSIS — E785 Hyperlipidemia, unspecified: Secondary | ICD-10-CM

## 2012-07-07 DIAGNOSIS — IMO0001 Reserved for inherently not codable concepts without codable children: Secondary | ICD-10-CM

## 2012-07-07 DIAGNOSIS — E119 Type 2 diabetes mellitus without complications: Secondary | ICD-10-CM | POA: Insufficient documentation

## 2012-07-07 DIAGNOSIS — E349 Endocrine disorder, unspecified: Secondary | ICD-10-CM

## 2012-07-07 DIAGNOSIS — G4700 Insomnia, unspecified: Secondary | ICD-10-CM

## 2012-07-07 MED ORDER — METFORMIN HCL 1000 MG PO TABS: ORAL_TABLET | ORAL | Status: DC

## 2012-07-07 MED ORDER — LOSARTAN POTASSIUM-HCTZ 100-25 MG PO TABS: 1.0000 | ORAL_TABLET | Freq: Every day | ORAL | Status: DC

## 2012-07-07 MED ORDER — ATORVASTATIN CALCIUM 20 MG PO TABS: 20.0000 mg | ORAL_TABLET | Freq: Every day | ORAL | Status: DC

## 2012-07-07 MED ORDER — ZOLPIDEM TARTRATE 5 MG PO TABS: ORAL_TABLET | ORAL | Status: DC

## 2012-07-07 MED ORDER — PANTOPRAZOLE SODIUM 40 MG PO TBEC: 40.0000 mg | DELAYED_RELEASE_TABLET | Freq: Every day | ORAL | Status: DC

## 2012-07-07 NOTE — Progress Notes (Signed)
Subjective:    Patient ID: Derrick Brown, male    DOB: 01/16/1951, 61 y.o.   MRN: [id number]  HPI [name] is a 61 year old married male nonsmoker who comes in today for evaluation of the metabolic syndrome  He takes Lipitor 20 mg Monday and Friday. He cannot tolerate it daily because it causes muscle and joint pain.  He takes Cozaar 100 mg daily however BP is not at goal. Today is 160/90  He takes metformin 500 mg twice daily however blood sugar is not at goal. Blood sugar today 193 A1c was not done.  He has allergic rhinitis for which she takes over-the-counter Claritin he also takes an aspirin tablet. He has a history of low testosterone. He was on a supplement but stopped it because he was having side effects.   he's concerned about his heart although he has no history of any angina. He does not exercise on a regular basis.  He also has a bump on his scalp he would like checked.  He gets routine eye care, dental care, colonoscopy due for followup, tetanus 2007, seasonal flu shot today   Review of Systems  Constitutional: Negative.   HENT: Negative.   Eyes: Negative.   Respiratory: Negative.   Cardiovascular: Negative.   Gastrointestinal: Negative.   Genitourinary: Negative.   Musculoskeletal: Negative.   Skin: Negative.   Neurological: Negative.   Hematological: Negative.   Psychiatric/Behavioral: Negative.        Objective:   Physical Exam  Constitutional: He is oriented to person, place, and time. He appears well-developed and well-nourished.  HENT:  Head: Normocephalic and atraumatic.  Right Ear: External ear normal.  Left Ear: External ear normal.  Nose: Nose normal.  Mouth/Throat: Oropharynx is clear and moist.  Eyes: Conjunctivae normal and EOM are normal. Pupils are equal, round, and reactive to light.  Neck: Normal range of motion. Neck supple. No JVD present. No tracheal deviation present. No thyromegaly present.  Cardiovascular: Normal rate, regular rhythm,  normal heart sounds and intact distal pulses.  Exam reveals no gallop and no friction rub.   No murmur heard.      No carotid or abdominal bruits peripheral pulses normal no neuropathy  Pulmonary/Chest: Effort normal and breath sounds normal. No stridor. No respiratory distress. He has no wheezes. He has no rales. He exhibits no tenderness.  Abdominal: Soft. Bowel sounds are normal. He exhibits no distension and no mass. There is no tenderness. There is no rebound and no guarding.  Genitourinary: Rectum normal, prostate normal and penis normal. Guaiac negative stool. No penile tenderness.  Musculoskeletal: Normal range of motion. He exhibits no edema and no tenderness.       Recent treatment by a podiatrist left fourth toe  Lymphadenopathy:    He has no cervical adenopathy.  Neurological: He is alert and oriented to person, place, and time. He has normal reflexes. No cranial nerve deficit. He exhibits normal muscle tone.  Skin: Skin is warm and dry. No rash noted. No erythema. No pallor.       Body skin exam normal he does have a marble-sized sebaceous cyst right posterior scalp however is not increasing in size nor has it become infected I would recommend we just observe that.  Psychiatric: He has a normal mood and affect. His behavior is normal. Judgment and thought content normal.          Assessment & Plan:  Healthy male  Metabolic syndrome continue current meds except change the Cozaar  to Hyzaar to see if we can't get his blood pressure the goal. Also increase metformin to 1000 mg the morning 500 prior to evening meal followup blood sugar and A1c in 3 months  Continue protonic for chronic reflux  Recommended over-the-counter melatonin for sleep dysfunction he is requesting Ambien  High risk for coronary artery disease asymptomatic stress test per patient request

## 2012-07-07 NOTE — Patient Instructions (Signed)
This stop the Cozaar and begin Hyzaar one tablet daily  Check your blood pressure daily in the morning and return in 4 weeks for followup with all your blood pressure readings and the device  Increase the Glucophage take a full tablet prior to breakfast continue a half a tablet prior to evening meal  Return in 3 months for followup ,,,,,,,,,,, labs one week prior  Over and above your work begin a walking program 15 minutes daily  We will get you set up in cardiology with Dr. Shirlee Latch for a cardiac stress test

## 2012-07-08 ENCOUNTER — Other Ambulatory Visit: Payer: Self-pay | Admitting: Family Medicine

## 2012-07-08 DIAGNOSIS — IMO0002 Reserved for concepts with insufficient information to code with codable children: Secondary | ICD-10-CM

## 2012-07-08 DIAGNOSIS — E785 Hyperlipidemia, unspecified: Secondary | ICD-10-CM

## 2012-07-17 ENCOUNTER — Encounter: Payer: Self-pay | Admitting: Family Medicine

## 2012-07-18 ENCOUNTER — Ambulatory Visit (INDEPENDENT_AMBULATORY_CARE_PROVIDER_SITE_OTHER): Payer: BC Managed Care – PPO | Admitting: Family Medicine

## 2012-07-18 ENCOUNTER — Encounter: Payer: Self-pay | Admitting: Family Medicine

## 2012-07-18 ENCOUNTER — Telehealth: Payer: Self-pay | Admitting: Family Medicine

## 2012-07-18 VITALS — BP 124/80

## 2012-07-18 DIAGNOSIS — T7840XA Allergy, unspecified, initial encounter: Secondary | ICD-10-CM | POA: Insufficient documentation

## 2012-07-18 DIAGNOSIS — Z888 Allergy status to other drugs, medicaments and biological substances status: Secondary | ICD-10-CM

## 2012-07-18 DIAGNOSIS — IMO0002 Reserved for concepts with insufficient information to code with codable children: Secondary | ICD-10-CM

## 2012-07-18 MED ORDER — PREDNISONE 20 MG PO TABS: ORAL_TABLET | ORAL | Status: DC

## 2012-07-18 MED ORDER — LOSARTAN POTASSIUM 100 MG PO TABS: 100.0000 mg | ORAL_TABLET | Freq: Every day | ORAL | Status: DC

## 2012-07-18 NOTE — Telephone Encounter (Signed)
Spoke with patient and appointment made today for 12:45

## 2012-07-18 NOTE — Patient Instructions (Addendum)
Stop the Hyzaar  Restart the Cozaar  Monitor your blood pressure as outlined  Take the prednisone as directed to get rid of the skin rash  Return when necessary

## 2012-07-18 NOTE — Telephone Encounter (Signed)
Caller: Pam/Spouse; Patient Name: Derrick Brown; PCP: Kelle Darting [facility]); Best Callback Phone Number: [phone].  Pt was given a new RX after physical on (9/19/3).  New RX was Losartan/HCTZ.  Pt began have leg itching, whelps and tiny red bumps.  Pt stopped taking Losartan/HCTZ and went back to just Losartan.  Pt has tried Benadryl and Hydrocortisone Cream.  Legs are still itching and still has tiny red bumps.  Pt is not with caller, he is at work.  Triaged patient per Rash Protocol.  Call Provider within 4 Hours Disposition for 'New onset of rash after beginning new prescribed, noprescribed or alternative/complementary medication'. OFFICE PLEASE FOLLOW UP WITH PATIENT REGARDING RASH AND MEDICATION. CALL PT AT [phone].  Caller states if MD wants pt to continue Losartan, he will need a refil.

## 2012-07-18 NOTE — Progress Notes (Signed)
  Subjective:    Patient ID: Derrick Brown, male    DOB: 1951/09/29, 61 y.o.   MRN: [id number]  HPI [name]  Is a 61 year old married male nonsmoker who comes in today for evaluation of a skin rash  Last week we switched his medication to Hyzaar. He was on Cozaar. 4 days after changing he noticed the gradual onset of itching on his lower extremities that evolved into a diffuse rash macular papular over the lower extremities and the arms and somewhat on the abdomen also. Thinking it was a medicine reaction he stopped the Hyzaar went back to the Cozaar but the rash has persisted. He has a history of allergic rhinitis and asthma and is been on allergy shots in the past but has had no history of any food allergies. No airway edema or shortness of breath   Review of Systems    general and dermatologic review of systems otherwise negative Objective:   Physical Exam  Well-developed well nourished male no acute distress examination of skin shows a diffuse macular papular lesions on the legs and the arms consistent with some type of a allergic reaction. Question the diuretic      Assessment & Plan:  Allergic reaction and question the diuretic plan go back to the losartan alone  Prednisone burst and taper to get rid of the rash returned.

## 2012-08-02 ENCOUNTER — Ambulatory Visit: Payer: BC Managed Care – PPO | Admitting: Family Medicine

## 2012-08-03 ENCOUNTER — Ambulatory Visit: Payer: BC Managed Care – PPO | Admitting: Cardiology

## 2012-08-04 ENCOUNTER — Ambulatory Visit: Payer: BC Managed Care – PPO | Admitting: Family Medicine

## 2012-09-01 ENCOUNTER — Encounter: Payer: Self-pay | Admitting: Internal Medicine

## 2012-09-02 ENCOUNTER — Encounter: Payer: Self-pay | Admitting: *Deleted

## 2012-09-05 ENCOUNTER — Ambulatory Visit (INDEPENDENT_AMBULATORY_CARE_PROVIDER_SITE_OTHER): Payer: BC Managed Care – PPO | Admitting: Cardiology

## 2012-09-05 ENCOUNTER — Encounter: Payer: Self-pay | Admitting: Cardiology

## 2012-09-05 VITALS — BP 150/88 | HR 73 | Ht 73.5 in | Wt 238.0 lb

## 2012-09-05 DIAGNOSIS — E785 Hyperlipidemia, unspecified: Secondary | ICD-10-CM

## 2012-09-05 DIAGNOSIS — E7800 Pure hypercholesterolemia, unspecified: Secondary | ICD-10-CM

## 2012-09-05 DIAGNOSIS — R0609 Other forms of dyspnea: Secondary | ICD-10-CM

## 2012-09-05 DIAGNOSIS — R0989 Other specified symptoms and signs involving the circulatory and respiratory systems: Secondary | ICD-10-CM

## 2012-09-05 DIAGNOSIS — I10 Essential (primary) hypertension: Secondary | ICD-10-CM

## 2012-09-05 DIAGNOSIS — R0600 Dyspnea, unspecified: Secondary | ICD-10-CM

## 2012-09-05 DIAGNOSIS — IMO0002 Reserved for concepts with insufficient information to code with codable children: Secondary | ICD-10-CM

## 2012-09-05 MED ORDER — ATORVASTATIN CALCIUM 20 MG PO TABS: ORAL_TABLET | ORAL | Status: DC

## 2012-09-05 MED ORDER — AMLODIPINE BESYLATE 5 MG PO TABS: 5.0000 mg | ORAL_TABLET | Freq: Every day | ORAL | Status: DC

## 2012-09-05 NOTE — Patient Instructions (Signed)
Take amlodipine 5mg  daily.  Increase lipitor to 20mg  every other day.   Take coenzyme Q10 200mg  daily. You can get this without a prescription.   Your physician has requested that you have an exercise tolerance test. For further information please visit [email or web address] Please also follow instruction sheet, as given.  Your physician recommends that you return for a FASTING lipid profile /liver profile in 2 months.   Your physician recommends that you schedule a follow-up appointment in: 2 months with Dr Shirlee Latch a few days after you have the fasting lipid /liver profile.  Marland Kitchen

## 2012-09-05 NOTE — Progress Notes (Signed)
Patient ID: Derrick Brown, male   DOB: 09/18/1951, 62 y.o.   MRN: [id number] PCP: Dr. Tawanna Cooler  61 yo with multiple cardiac risk factors including type II diabetes, hyperlipidemia, and HTN presents for cardiology evaluation.  His father and uncles additionally all had a history of CAD.  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  He is a nonsmoker.  He is taking atorvastatin twice a week because he has had myalgias when trying to take it daily.  BP is running high today and has been systolic in the 150s for the most part at home.  Dr. Tawanna Cooler recently added HCTZ to his regimen but he appears to have had a rash develop after starting this med so is now off it.    Patient has not had any chest pain or pressure.  His most strenuous activity is bird hunting (walking in fields often for long distances).  He does this 1-2 times a month in season.  Otherwise, his most activity is walking the car lot at work.  He does not get exertional dyspnea with these activities.  However, on occasion, he will get short of breath walking to his mailbox (a distance of about 50 yards).  This does not happen every time and he is unsure about what in particular triggers the dyspnea when it does occur.     Labs (9/13): K 4.2, creatinine 0.8, HDL 26, LDL 95, TGs 296, TSH normal  ECG: NSR, normal  PMH: 1. Type II diabetes 2. Hyperlipidemia: Myalgias when he take atorvastatin daily.  3. HTN: rash with HCTZ  SH: Nonsmoker.  Lives in [place].  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   FH: Father with MI at 80, 3 uncles with CABG or MIs.   ROS: All systems reviewed and negative except as per HPI.   Current Outpatient Prescriptions  Medication Sig Dispense Refill  . aspirin 81 MG tablet Take 81 mg by mouth daily.        Marland Kitchen atorvastatin (LIPITOR) 20 MG tablet 1 every other day  30 tablet  3  . FREESTYLE LITE test strip USE TO TEST GLUCOSE DAILY OR AS DIRECTED BY YOUR  DOCTOR  100 each  3  . loratadine (CLARITIN) 10 MG tablet Take 10 mg by mouth daily.        Marland Kitchen losartan (COZAAR) 100 MG tablet Take 1 tablet (100 mg total) by mouth daily.  100 tablet  3  . metFORMIN (GLUCOPHAGE) 1000 MG tablet One tablet before breakfast and a half a tablet before your evening meal  150 tablet  3  . Multiple Vitamin (MULTIVITAMIN) tablet Take 1 tablet by mouth daily.        . Omega-3 Fatty Acids (FISH OIL) 1000 MG CAPS Take by mouth daily.        . pantoprazole (PROTONIX) 40 MG tablet Take 1 tablet (40 mg total) by mouth daily.  100 tablet  3  . zolpidem (AMBIEN) 5 MG tablet One half tablet each bedtime when necessary  60 tablet  3  . [DISCONTINUED] atorvastatin (LIPITOR) 20 MG tablet Take 1 tablet (20 mg total) by mouth daily.  50 tablet  1  . [DISCONTINUED] atorvastatin (LIPITOR) 20 MG tablet Take 20 mg by mouth 2 (two) times a week.      Marland Kitchen amLODipine (NORVASC) 5 MG tablet Take 1 tablet (5 mg total) by mouth daily.  30 tablet  6  .  Coenzyme Q10 200 MG TABS Take 1 daily        BP 150/88  Pulse 73  Ht 6' 1.5" (1.867 m)  Wt 238 lb (107.956 kg)  BMI 30.97 kg/m2 General: NAD, overweight.  Neck: No JVD, no thyromegaly or thyroid nodule.  Lungs: Clear to auscultation bilaterally with normal respiratory effort. CV: Nondisplaced PMI.  Heart regular S1/S2, no S3/S4, no murmur.  No peripheral edema.  No carotid bruit.  Normal pedal pulses.  Abdomen: Soft, nontender, no hepatosplenomegaly, no distention.  Skin: Intact without lesions or rashes.  Neurologic: Alert and oriented x 3.  Psych: Normal affect. Extremities: No clubbing or cyanosis.  HEENT: Normal.   Assessment/Plan:  1. CAD risk: Patient has multiple risk factors for CAD including DM, hyperlipidemia, HTN, and family history (though not premature family history).  He is not a smoker.  He does not have much in terms of exertional symptoms: only occasional dypsnea when walking to his mailbox (he is able to do more heavy  activities without trouble, it seems).  - Continue ASA 81 mg daily - ECG is normal.  I will have him do an ETT.  If this is normal, continue aggressive preventive care.  2. HTN: BP is too high.  Continue losartan, add amlodipine 5 mg daily.   3. Hyperlipidemia: LDL 95 with HDL low and TGs high.  Given multiple risk factors and strong family history, I would like to try to be more aggressive with statin.  I will have him try to take atorvastatin every other day instead of twice a week to see if he can tolerate this.  He can take coenzyme Q10 200 mg daily along with the atorvastatin to see if that can limit myalgias.  Check lipids/LFTs in 2 months.  I have asked him for now to try to manage diet and exercise more to help with triglycerides and HDL.    Marca Ancona 09/05/2012 1:48 PM

## 2012-09-21 ENCOUNTER — Ambulatory Visit (INDEPENDENT_AMBULATORY_CARE_PROVIDER_SITE_OTHER): Payer: BC Managed Care – PPO | Admitting: Physician Assistant

## 2012-09-21 DIAGNOSIS — E7800 Pure hypercholesterolemia, unspecified: Secondary | ICD-10-CM

## 2012-09-21 DIAGNOSIS — R079 Chest pain, unspecified: Secondary | ICD-10-CM

## 2012-09-21 DIAGNOSIS — I10 Essential (primary) hypertension: Secondary | ICD-10-CM

## 2012-09-21 DIAGNOSIS — R0989 Other specified symptoms and signs involving the circulatory and respiratory systems: Secondary | ICD-10-CM

## 2012-09-21 NOTE — Progress Notes (Signed)
Exercise Treadmill Test  Pre-Exercise Testing Evaluation Rhythm: normal sinus  Rate: 84   PR:  .20 QRS:  .06  QT:  .36 QTc: .40     Test  Exercise Tolerance Test Ordering MD: Marca Ancona, MD  Interpreting MD: Tereso Newcomer, PA-C  Unique Test No: 1  Treadmill:  1  Indication for ETT: chest pain - rule out ischemia  Contraindication to ETT: No   Stress Modality: exercise - treadmill  Cardiac Imaging Performed: non   Protocol: standard Bruce - maximal  Max BP:  217/80  Max MPHR (bpm):  161 85% MPR (bpm):  137  MPHR obtained (bpm):  141 % MPHR obtained:  87%  Reached 85% MPHR (min:sec):  3:56 Total Exercise Time (min-sec):  4:03  Workload in METS:  5.8 Borg Scale: 13  Reason ETT Terminated:  Machine Malfunction; target HR achieved    ST Segment Analysis At Rest: normal ST segments - no evidence of significant ST depression With Exercise: no evidence of significant ST depression  Other Information Arrhythmia:  No Angina during ETT:  absent (0) Quality of ETT:  diagnostic  ETT Interpretation:  normal - no evidence of ischemia by ST analysis  Comments: Fair exercise tolerance. No chest pain. Accelerated BP response to exercise (pre-test 143/83 => 193/92 in 1st minute). Test stopped early due to machine malfunction. However, target HR achieved. Increased artifact noted. No ST changes noted to suggest ischemia.  Recommendations: Consider adjusting anti-hypertensive medication. Follow up with Dr. Marca Ancona as directed. Signed,  Tereso Newcomer, PA-C  10:31 AM 09/21/2012

## 2012-09-30 ENCOUNTER — Other Ambulatory Visit (INDEPENDENT_AMBULATORY_CARE_PROVIDER_SITE_OTHER): Payer: BC Managed Care – PPO

## 2012-09-30 DIAGNOSIS — R0609 Other forms of dyspnea: Secondary | ICD-10-CM

## 2012-09-30 DIAGNOSIS — E7800 Pure hypercholesterolemia, unspecified: Secondary | ICD-10-CM

## 2012-09-30 DIAGNOSIS — R0989 Other specified symptoms and signs involving the circulatory and respiratory systems: Secondary | ICD-10-CM

## 2012-09-30 DIAGNOSIS — E1165 Type 2 diabetes mellitus with hyperglycemia: Secondary | ICD-10-CM

## 2012-09-30 DIAGNOSIS — IMO0001 Reserved for inherently not codable concepts without codable children: Secondary | ICD-10-CM

## 2012-09-30 DIAGNOSIS — I10 Essential (primary) hypertension: Secondary | ICD-10-CM

## 2012-09-30 LAB — HEPATIC FUNCTION PANEL
ALT: 50 U/L (ref 0–53)
AST: 23 U/L (ref 0–37)
Albumin: 4.1 g/dL (ref 3.5–5.2)
Alkaline Phosphatase: 61 U/L (ref 39–117)
Total Bilirubin: 0.9 mg/dL (ref 0.3–1.2)

## 2012-09-30 LAB — BASIC METABOLIC PANEL
Calcium: 9.1 mg/dL (ref 8.4–10.5)
Creatinine, Ser: 0.7 mg/dL (ref 0.4–1.5)
GFR: 126.59 mL/min (ref >60.00)
Sodium: 136 mEq/L (ref 135–145)

## 2012-09-30 LAB — LIPID PANEL
HDL: 25.3 mg/dL — ABNORMAL LOW (ref >39.00)
Total CHOL/HDL Ratio: 5
Triglycerides: 179 mg/dL — ABNORMAL HIGH (ref 0.0–149.0)

## 2012-09-30 LAB — HEMOGLOBIN A1C: Hgb A1c MFr Bld: 7.6 % — ABNORMAL HIGH (ref 4.6–6.5)

## 2012-10-03 ENCOUNTER — Telehealth: Payer: Self-pay | Admitting: Cardiology

## 2012-10-03 NOTE — Telephone Encounter (Signed)
Spoke with pt about recent lab results 

## 2012-10-03 NOTE — Telephone Encounter (Signed)
plz return call to pt at [phone]

## 2012-10-17 ENCOUNTER — Ambulatory Visit (AMBULATORY_SURGERY_CENTER): Payer: BC Managed Care – PPO | Admitting: *Deleted

## 2012-10-17 ENCOUNTER — Encounter: Payer: Self-pay | Admitting: Internal Medicine

## 2012-10-17 ENCOUNTER — Ambulatory Visit: Payer: BC Managed Care – PPO | Admitting: Family Medicine

## 2012-10-17 VITALS — Ht 74.0 in | Wt 237.0 lb

## 2012-10-17 DIAGNOSIS — Z1211 Encounter for screening for malignant neoplasm of colon: Secondary | ICD-10-CM

## 2012-10-17 MED ORDER — SUPREP BOWEL PREP KIT 17.5-3.13-1.6 GM/177ML PO SOLN: ORAL | Status: DC

## 2012-10-31 ENCOUNTER — Encounter: Payer: Self-pay | Admitting: Internal Medicine

## 2012-10-31 ENCOUNTER — Ambulatory Visit (AMBULATORY_SURGERY_CENTER): Payer: BC Managed Care – PPO | Admitting: Internal Medicine

## 2012-10-31 VITALS — BP 114/69 | HR 63 | Temp 97.3°F | Resp 15 | Ht 74.0 in | Wt 237.0 lb

## 2012-10-31 DIAGNOSIS — D126 Benign neoplasm of colon, unspecified: Secondary | ICD-10-CM

## 2012-10-31 DIAGNOSIS — K5730 Diverticulosis of large intestine without perforation or abscess without bleeding: Secondary | ICD-10-CM

## 2012-10-31 DIAGNOSIS — Z1211 Encounter for screening for malignant neoplasm of colon: Secondary | ICD-10-CM

## 2012-10-31 MED ORDER — SODIUM CHLORIDE 0.9 % IV SOLN: 500.0000 mL | INTRAVENOUS | Status: DC

## 2012-10-31 NOTE — Op Note (Signed)
[facility] [address] [place], [address]   COLONOSCOPY PROCEDURE REPORT  PATIENT: Derrick Brown, Derrick Brown  MR#: [id number] BIRTHDATE: 20-Jan-1951 , 61  yrs. old GENDER: Male ENDOSCOPIST: Iva Boop, MD, [facility] PROCEDURE DATE:  10/31/2012 PROCEDURE:   Colonoscopy with snare polypectomy ASA CLASS:   Class II INDICATIONS:average risk screening. MEDICATIONS: propofol (Diprivan) 200mg  IV, MAC sedation, administered by CRNA, and These medications were titrated to patient response per physician's verbal order  DESCRIPTION OF PROCEDURE:   After the risks benefits and alternatives of the procedure were thoroughly explained, informed consent was obtained.  A digital rectal exam revealed no abnormalities of the rectum and A digital rectal exam revealed the prostate was not enlarged.   The LB [id number]  endoscope was introduced through the anus and advanced to the cecum, which was identified by both the appendix and ileocecal valve. No adverse events experienced.   The quality of the prep was Suprep excellent The instrument was then slowly withdrawn as the colon was fully examined.      COLON FINDINGS: A polypoid shaped sessile polyp measuring 4 mm in size was found in the descending colon.  A polypectomy was performed with a cold snare.  The resection was complete and the polyp tissue was completely retrieved.   Moderate diverticulosis was noted The finding was in the left colon.   There was mild scattered diverticulosis noted in the right colon.   The colon mucosa was otherwise normal.   A right colon retroflexion was performed.  Retroflexed views revealed no abnormalities. The time to cecum=1 minutes 50 seconds.  Withdrawal time=9 minutes 49 seconds.  The scope was withdrawn and the procedure completed. COMPLICATIONS: There were no complications.  ENDOSCOPIC IMPRESSION: 1.   Sessile polyp measuring 4 mm in size was found in the descending colon; polypectomy  was performed with a cold snare 2.   Moderate diverticulosis was noted in the left colon 3.   There was mild diverticulosis noted in the left colon 4.   The colon mucosa was otherwise normal - excellent prep  RECOMMENDATIONS: Timing of repeat colonoscopy will be determined by pathology findings.   eSigned:  Iva Boop, MD, [facility] 10/31/2012 9:35 AM cc: The Patient

## 2012-10-31 NOTE — Patient Instructions (Addendum)
One tiny polyp was removed - it should be benign.  You also have diverticulosis - a common condition that is not usually a problem. A handout will explain more.  I will let you know pathology results and when to have another routine colonoscopy by mail.  Thank you for choosing me and [facility].  Iva Boop, MD, FACG   YOU HAD AN ENDOSCOPIC PROCEDURE TODAY AT THE [facility]: Refer to the procedure report that was given to you for any specific questions about what was found during the examination.  If the procedure report does not answer your questions, please call your gastroenterologist to clarify.  If you requested that your care partner not be given the details of your procedure findings, then the procedure report has been included in a sealed envelope for you to review at your convenience later.  YOU SHOULD EXPECT: Some feelings of bloating in the abdomen. Passage of more gas than usual.  Walking can help get rid of the air that was put into your GI tract during the procedure and reduce the bloating. If you had a lower endoscopy (such as a colonoscopy or flexible sigmoidoscopy) you may notice spotting of blood in your stool or on the toilet paper. If you underwent a bowel prep for your procedure, then you may not have a normal bowel movement for a few days.  DIET: Your first meal following the procedure should be a light meal and then it is ok to progress to your normal diet.  A half-sandwich or bowl of soup is an example of a good first meal.  Heavy or fried foods are harder to digest and may make you feel nauseous or bloated.  Likewise meals heavy in dairy and vegetables can cause extra gas to form and this can also increase the bloating.  Drink plenty of fluids but you should avoid alcoholic beverages for 24 hours.  ACTIVITY: Your care partner should take you home directly after the procedure.  You should plan to take it easy, moving slowly for the rest of the  day.  You can resume normal activity the day after the procedure however you should NOT DRIVE or use heavy machinery for 24 hours (because of the sedation medicines used during the test).    SYMPTOMS TO REPORT IMMEDIATELY: A gastroenterologist can be reached at any hour.  During normal business hours, 8:30 AM to 5:00 PM Monday through Friday, call [phone].  After hours and on weekends, please call the GI answering service at [phone] who will take a message and have the physician on call contact you.   Following lower endoscopy (colonoscopy or flexible sigmoidoscopy):  Excessive amounts of blood in the stool  Significant tenderness or worsening of abdominal pains  Swelling of the abdomen that is new, acute  Fever of 100F or higher    FOLLOW UP: If any biopsies were taken you will be contacted by phone or by letter within the next 1-3 weeks.  Call your gastroenterologist if you have not heard about the biopsies in 3 weeks.  Our staff will call the home number listed on your records the next business day following your procedure to check on you and address any questions or concerns that you may have at that time regarding the information given to you following your procedure. This is a courtesy call and so if there is no answer at the home number and we have not heard from you through the emergency physician  on call, we will assume that you have returned to your regular daily activities without incident.  SIGNATURES/CONFIDENTIALITY: You and/or your care partner have signed paperwork which will be entered into your electronic medical record.  These signatures attest to the fact that that the information above on your After Visit Summary has been reviewed and is understood.  Full responsibility of the confidentiality of this discharge information lies with you and/or your care-partner.       Information on polyps & diverticulosis given to you today

## 2012-10-31 NOTE — Progress Notes (Signed)
Patient did not have preoperative order for IV antibiotic SSI prophylaxis. (G8918)  Patient did not experience any of the following events: a burn prior to discharge; a fall within the facility; wrong site/side/patient/procedure/implant event; or a hospital transfer or hospital admission upon discharge from the facility. (G8907)  

## 2012-10-31 NOTE — Progress Notes (Signed)
Called to room to assist during endoscopic procedure.  Patient ID and intended procedure confirmed with present staff. Received instructions for my participation in the procedure from the performing physician.  

## 2012-11-01 ENCOUNTER — Emergency Department (HOSPITAL_COMMUNITY)
Admission: EM | Admit: 2012-11-01 | Discharge: 2012-11-01 | Disposition: A | Discharge status: Home / Self Care | Payer: BC Managed Care – PPO | Attending: Emergency Medicine | Admitting: Emergency Medicine

## 2012-11-01 ENCOUNTER — Telehealth: Payer: Self-pay

## 2012-11-01 ENCOUNTER — Emergency Department (HOSPITAL_COMMUNITY): Payer: BC Managed Care – PPO

## 2012-11-01 ENCOUNTER — Telehealth: Payer: Self-pay | Admitting: Gastroenterology

## 2012-11-01 ENCOUNTER — Encounter (HOSPITAL_COMMUNITY): Payer: Self-pay | Admitting: *Deleted

## 2012-11-01 DIAGNOSIS — E785 Hyperlipidemia, unspecified: Secondary | ICD-10-CM | POA: Insufficient documentation

## 2012-11-01 DIAGNOSIS — Z9889 Other specified postprocedural states: Secondary | ICD-10-CM | POA: Insufficient documentation

## 2012-11-01 DIAGNOSIS — I10 Essential (primary) hypertension: Secondary | ICD-10-CM | POA: Insufficient documentation

## 2012-11-01 DIAGNOSIS — E119 Type 2 diabetes mellitus without complications: Secondary | ICD-10-CM | POA: Insufficient documentation

## 2012-11-01 DIAGNOSIS — R1110 Vomiting, unspecified: Secondary | ICD-10-CM | POA: Insufficient documentation

## 2012-11-01 DIAGNOSIS — Z87891 Personal history of nicotine dependence: Secondary | ICD-10-CM | POA: Insufficient documentation

## 2012-11-01 DIAGNOSIS — Z8719 Personal history of other diseases of the digestive system: Secondary | ICD-10-CM | POA: Insufficient documentation

## 2012-11-01 DIAGNOSIS — K219 Gastro-esophageal reflux disease without esophagitis: Secondary | ICD-10-CM | POA: Insufficient documentation

## 2012-11-01 DIAGNOSIS — Z79899 Other long term (current) drug therapy: Secondary | ICD-10-CM | POA: Insufficient documentation

## 2012-11-01 DIAGNOSIS — Z7982 Long term (current) use of aspirin: Secondary | ICD-10-CM | POA: Insufficient documentation

## 2012-11-01 DIAGNOSIS — Z87442 Personal history of urinary calculi: Secondary | ICD-10-CM | POA: Insufficient documentation

## 2012-11-01 DIAGNOSIS — Z8739 Personal history of other diseases of the musculoskeletal system and connective tissue: Secondary | ICD-10-CM | POA: Insufficient documentation

## 2012-11-01 DIAGNOSIS — G4700 Insomnia, unspecified: Secondary | ICD-10-CM | POA: Insufficient documentation

## 2012-11-01 LAB — URINALYSIS, ROUTINE W REFLEX MICROSCOPIC
Bilirubin Urine: NEGATIVE
Glucose, UA: NEGATIVE mg/dL
Hgb urine dipstick: NEGATIVE
Specific Gravity, Urine: >1.046 — ABNORMAL HIGH (ref 1.005–1.030)
Urobilinogen, UA: 0.2 mg/dL (ref 0.0–1.0)
pH: 6 (ref 5.0–8.0)

## 2012-11-01 LAB — CBC WITH DIFFERENTIAL/PLATELET
HCT: 42.6 % (ref 39.0–52.0)
Hemoglobin: 15.6 g/dL (ref 13.0–17.0)
Lymphocytes Relative: 3 % — ABNORMAL LOW (ref 12–46)
Lymphs Abs: 0.8 10*3/uL (ref 0.7–4.0)
Monocytes Relative: 7 % (ref 3–12)
Neutro Abs: 20.8 10*3/uL — ABNORMAL HIGH (ref 1.7–7.7)
Neutrophils Relative %: 89 % — ABNORMAL HIGH (ref 43–77)
RBC: 4.88 MIL/uL (ref 4.22–5.81)

## 2012-11-01 LAB — COMPREHENSIVE METABOLIC PANEL
Albumin: 4 g/dL (ref 3.5–5.2)
Alkaline Phosphatase: 73 U/L (ref 39–117)
BUN: 11 mg/dL (ref 6–23)
CO2: 25 mEq/L (ref 19–32)
Chloride: 99 mEq/L (ref 96–112)
GFR calc Af Amer: >90 mL/min (ref >90)
GFR calc non Af Amer: >90 mL/min (ref >90)
Glucose, Bld: 211 mg/dL — ABNORMAL HIGH (ref 70–99)
Potassium: 3.8 mEq/L (ref 3.5–5.1)
Total Bilirubin: 0.6 mg/dL (ref 0.3–1.2)

## 2012-11-01 LAB — LIPASE, BLOOD: Lipase: 9 U/L — ABNORMAL LOW (ref 11–59)

## 2012-11-01 MED ORDER — IOHEXOL 300 MG/ML  SOLN
100.0000 mL | Freq: Once | INTRAMUSCULAR | Status: AC | PRN
  Administered 2012-11-01: 100 mL via INTRAVENOUS

## 2012-11-01 MED ORDER — SODIUM CHLORIDE 0.9 % IV SOLN
INTRAVENOUS | Status: DC
  Administered 2012-11-01: 09:00:00 via INTRAVENOUS

## 2012-11-01 MED ORDER — HYDROMORPHONE HCL PF 1 MG/ML IJ SOLN
1.0000 mg | Freq: Once | INTRAMUSCULAR | Status: AC
  Administered 2012-11-01: 1 mg via INTRAVENOUS
  Filled 2012-11-01: qty 1

## 2012-11-01 MED ORDER — GI COCKTAIL ~~LOC~~
30.0000 mL | Freq: Once | ORAL | Status: AC
  Administered 2012-11-01: 30 mL via ORAL
  Filled 2012-11-01: qty 30

## 2012-11-01 MED ORDER — SUCRALFATE 1 G PO TABS
1.0000 g | ORAL_TABLET | Freq: Three times a day (TID) | ORAL | Status: DC
  Administered 2012-11-01: 1 g via ORAL
  Filled 2012-11-01 (×4): qty 1

## 2012-11-01 MED ORDER — ONDANSETRON HCL 4 MG/2ML IJ SOLN
4.0000 mg | Freq: Once | INTRAMUSCULAR | Status: AC
  Administered 2012-11-01: 4 mg via INTRAVENOUS
  Filled 2012-11-01: qty 2

## 2012-11-01 MED ORDER — SUCRALFATE 1 G PO TABS: 1.0000 g | ORAL_TABLET | Freq: Four times a day (QID) | ORAL | Status: DC

## 2012-11-01 NOTE — ED Notes (Signed)
Pt from home with reports of epigastric pain that started yesterday evening around 6pm after colonoscopy that was done yesterday morning by Dr. Stan Head. Pt reports that a small polyp was removed as well as mild diverticulosis. Pt reports calling on call MD this morning and was told that their may be a small tear and to come to ED, pt denies N/V/D or abnormal bleeding at present.

## 2012-11-01 NOTE — ED Notes (Signed)
Patient requesting additional pain meds- MD Freida Busman verbally notified

## 2012-11-01 NOTE — ED Provider Notes (Signed)
History     CSN: [id number]  Arrival date & time 11/01/12  4098   First MD Initiated Contact with Patient 11/01/12 0800      Chief Complaint  Patient presents with  . Abdominal Pain    epigastric    (Consider location/radiation/quality/duration/timing/severity/associated sxs/prior treatment) Patient is a 62 y.o. male presenting with abdominal pain. The history is provided by the patient and the spouse.  Abdominal Pain The primary symptoms of the illness include abdominal pain.   patient here complaining of upper quadrant and epigastric pain that started at 6 PM last night. Patient had a colonoscopy done yesterday morning. Pain started after he ate food and was associated with emesis x2. No further emesis since then. No black or bloody stools. His his emesis was nonbilious. No reported fevers. Pain is worse with movement and better with remaining still. Denies any urinary symptoms. No medications used prior to arrival  Past Medical History  Diagnosis Date  . ACNE ROSACEA 02/21/2009  . COUGH DUE TO ACE INHIBITORS 04/12/2009  . DYSPHAGIA 05/15/2009  . ESOPHAGITIS, REFLUX 05/13/2009  . FATIGUE 03/14/2010  . HYPERGLYCEMIA 02/21/2009  . HYPERLIPIDEMIA 02/21/2009  . HYPERTENSION NEC 02/21/2009  . INSOMNIA 04/02/2010  . MUSCLE PAIN 05/13/2009  . OBSTRUCTIVE SLEEP APNEA 04/02/2010  . RENAL CALCULUS 02/21/2009  . TEMPOROMANDIBULAR JOINT DISORDER 06/10/2009  . Unspecified hypertrophic and atrophic condition of skin   . GERD (gastroesophageal reflux disease)   . Diabetes mellitus without complication     type 2    Past Surgical History  Procedure Date  . Kidney stone surgery     removal  . Esophagogastroduodenoscopy   . Colonoscopy     Family History  Problem Relation Age of Onset  . Healthy Mother   . Heart attack Father 65  . Healthy Brother   . Colon polyps Sister   . Heart disease      family histrory    History  Substance Use Topics  . Smoking status: Former Games developer  . Smokeless  tobacco: Never Used  . Alcohol Use: 1.7 oz/week    2 Glasses of wine, 1 Drinks containing 0.5 oz of alcohol per week      Review of Systems  Gastrointestinal: Positive for abdominal pain.  All other systems reviewed and are negative.    Allergies  Hctz  Home Medications   Current Outpatient Rx  Name  Route  Sig  Dispense  Refill  . AMLODIPINE BESYLATE 5 MG PO TABS   Oral   Take 1 tablet (5 mg total) by mouth daily.   30 tablet   6   . ASPIRIN 81 MG PO TABS   Oral   Take 81 mg by mouth daily.           . ATORVASTATIN CALCIUM 20 MG PO TABS      1 every other day   30 tablet   3   . COENZYME Q10 200 MG PO TABS      Take 1 daily         . FREESTYLE LITE TEST VI STRP      USE TO TEST GLUCOSE DAILY OR AS DIRECTED BY YOUR DOCTOR   100 each   3   . LOSARTAN POTASSIUM 100 MG PO TABS   Oral   Take 1 tablet (100 mg total) by mouth daily.   100 tablet   3     Time for a yearly physical   . METFORMIN HCL 1000 MG  PO TABS      One tablet before breakfast and a half a tablet before your evening meal   150 tablet   3   . ONE-DAILY MULTI VITAMINS PO TABS   Oral   Take 1 tablet by mouth daily.           Marland Kitchen FISH OIL 1000 MG PO CAPS   Oral   Take by mouth daily.           Marland Kitchen PANTOPRAZOLE SODIUM 40 MG PO TBEC   Oral   Take 1 tablet (40 mg total) by mouth daily.   100 tablet   3   . ZOLPIDEM TARTRATE 5 MG PO TABS      One half tablet each bedtime when necessary   60 tablet   3     BP 108/75  Pulse 98  Temp 98.1 F (36.7 C) (Oral)  Resp 16  SpO2 98%  Physical Exam  Nursing note and vitals reviewed. Constitutional: He is oriented to person, place, and time. He appears well-developed and well-nourished.  Non-toxic appearance. No distress.  HENT:  Head: Normocephalic and atraumatic.  Eyes: Conjunctivae normal, EOM and lids are normal. Pupils are equal, round, and reactive to light.  Neck: Normal range of motion. Neck supple. No tracheal  deviation present. No mass present.  Cardiovascular: Regular rhythm and normal heart sounds.  Tachycardia present.  Exam reveals no gallop.   No murmur heard. Pulmonary/Chest: Effort normal and breath sounds normal. No stridor. No respiratory distress. He has no decreased breath sounds. He has no wheezes. He has no rhonchi. He has no rales.  Abdominal: Soft. Normal appearance and bowel sounds are normal. He exhibits no distension. There is tenderness in the right upper quadrant and right lower quadrant. There is guarding. There is no rebound and no CVA tenderness.    Musculoskeletal: Normal range of motion. He exhibits no edema and no tenderness.  Neurological: He is alert and oriented to person, place, and time. He has normal strength. No cranial nerve deficit or sensory deficit. GCS eye subscore is 4. GCS verbal subscore is 5. GCS motor subscore is 6.  Skin: Skin is warm and dry. No abrasion and no rash noted.  Psychiatric: He has a normal mood and affect. His speech is normal and behavior is normal.    ED Course  Procedures (including critical care time)   Labs Reviewed  CBC WITH DIFFERENTIAL  COMPREHENSIVE METABOLIC PANEL  LIPASE, BLOOD   No results found.   No diagnosis found.    MDM  Pt given meds and does so slightly better. Abdominal CT negative for free air. Patient symptoms started after he ate and he likely does have some reflux which he does have a history of. Will give patient medications for this and he will followup with his gi md        Toy Baker, MD 11/01/12 1218

## 2012-11-01 NOTE — Telephone Encounter (Signed)
I called to follow up. He is better and tolerated a diet. He is still sore in RUQ. Had a steak sandwich yesterday afternoon and vomited afterwards.  He will call if not better

## 2012-11-01 NOTE — Telephone Encounter (Signed)
Had colonoscopy yesterday.  Since then c/o bloating, nausea (vomited x 1) and worsening abdominal pain.  Small sessile polyp removed at colonoscopy 11/01/11. Instructed to go to ED for evaluation.

## 2012-11-01 NOTE — Telephone Encounter (Signed)
07:33 left a message at [phone] to call if he has any questions or concerns. Maw

## 2012-11-02 ENCOUNTER — Telehealth: Payer: Self-pay | Admitting: Internal Medicine

## 2012-11-02 LAB — URINE CULTURE
Colony Count: NO GROWTH
Culture: NO GROWTH

## 2012-11-02 NOTE — Telephone Encounter (Signed)
Patient had a colonoscopy on Monday and a ER visit that night for RUQ pain.  His wife reports that he is not feeling any better..  He has pain unless he is lying on his back.  No worse that it was at the ER.  They will come in and see Derrick Sou, PA on 11/03/12 at 8:30.  They know to call on call MD or ER if his symptoms worsen over night.  They will try warm or room temp liquids also this evening.

## 2012-11-03 ENCOUNTER — Ambulatory Visit (INDEPENDENT_AMBULATORY_CARE_PROVIDER_SITE_OTHER): Payer: BC Managed Care – PPO | Admitting: Gastroenterology

## 2012-11-03 ENCOUNTER — Encounter: Payer: Self-pay | Admitting: Internal Medicine

## 2012-11-03 ENCOUNTER — Other Ambulatory Visit (INDEPENDENT_AMBULATORY_CARE_PROVIDER_SITE_OTHER): Payer: BC Managed Care – PPO

## 2012-11-03 ENCOUNTER — Encounter: Payer: Self-pay | Admitting: Gastroenterology

## 2012-11-03 VITALS — BP 132/68 | HR 88 | Ht 74.0 in | Wt 227.0 lb

## 2012-11-03 DIAGNOSIS — R1011 Right upper quadrant pain: Secondary | ICD-10-CM | POA: Insufficient documentation

## 2012-11-03 DIAGNOSIS — R1013 Epigastric pain: Secondary | ICD-10-CM

## 2012-11-03 DIAGNOSIS — Z860100 Personal history of colon polyps, unspecified: Secondary | ICD-10-CM | POA: Insufficient documentation

## 2012-11-03 LAB — CBC WITH DIFFERENTIAL/PLATELET
Basophils Absolute: 0.2 10*3/uL — ABNORMAL HIGH (ref 0.0–0.1)
Eosinophils Absolute: 0.3 10*3/uL (ref 0.0–0.7)
Lymphocytes Relative: 10.1 % — ABNORMAL LOW (ref 12.0–46.0)
Monocytes Relative: 9.3 % (ref 3.0–12.0)
Neutrophils Relative %: 77.2 % — ABNORMAL HIGH (ref 43.0–77.0)
Platelets: 246 10*3/uL (ref 150.0–400.0)
RDW: 13.1 % (ref 11.5–14.6)

## 2012-11-03 LAB — HEPATIC FUNCTION PANEL
AST: 21 U/L (ref 0–37)
Alkaline Phosphatase: 78 U/L (ref 39–117)
Total Bilirubin: 1.6 mg/dL — ABNORMAL HIGH (ref 0.3–1.2)

## 2012-11-03 MED ORDER — HYDROCODONE-ACETAMINOPHEN 5-325 MG PO TABS: 1.0000 | ORAL_TABLET | Freq: Four times a day (QID) | ORAL | Status: DC | PRN

## 2012-11-03 MED ORDER — NAPROXEN 250 MG PO TABS: ORAL_TABLET | ORAL | Status: DC

## 2012-11-03 NOTE — Progress Notes (Signed)
Etiology of problem not clear. CT is reassuring Labs returned with improved WBC, mild unconjugated hyperbilirubinemia  Lab Results  Component Value Date   ALT 43 11/03/2012   AST 21 11/03/2012   ALKPHOS 78 11/03/2012   BILITOT 1.6* 11/03/2012   Lab Results  Component Value Date   WBC 15.4* 11/03/2012   HGB 15.2 11/03/2012   HCT 44.3 11/03/2012   MCV 91.1 11/03/2012   PLT 246.0 11/03/2012   Continue supportive care. He is improving.  Iva Boop, MD, [facility]

## 2012-11-03 NOTE — Progress Notes (Signed)
Quick Note:  WBC better but mildly elevated Mild elevation in bilirubin is not of concern at this point  Follow treatment plans and if not improving and sxs same may need an [place] of [place]  I would think he should be all better or close to that by Monday   ______

## 2012-11-03 NOTE — Patient Instructions (Addendum)
Please go to the basement level to have your labs drawn.  We faxed the Vicodin prescription to [facility] [address]. The Naproxen prescription was sent electronically.  We will call you with the lab results.  If you have any questions or problems do not hesitate to call us at [phone], choose option 2 for nurse .

## 2012-11-03 NOTE — Progress Notes (Signed)
11/03/2012 Derrick Brown [id number] 17-Jan-1951   History of Present Illness:  Patient is a pleasant 62 year old male who just underwent uneventful colonoscopy by Dr. Leone Payor on 1/13.  Since colonoscopy he has been experiencing abdominal pain that began in his epigastrium, but has now localized to the RUQ.  He had some nausea and one episode of vomiting the evening of the colonoscopy, but none since.  Has not been eating much because he does not feel well and does not have an appetite, but eating does not worsen the pain.  Pain is actually worsened by movement and lying on the right side.  He has not moved his bowels since the colonoscopy.  Never had similar pains prior to the colonoscopy.  Pain is described as severe at times, but when he is sitting still there is really no pain.  Report low-grade fever of 99's and some "cold sweats".  Went to ER where abdominal xray and CT scan of the abdomen and pelvis were unremarkable for perforation/free air or any other acute etiologies.  CBC, CMP, lipase, and urine study were all normal except for an elevated WBC count of 23.  Was given pain medication in the ED and sent home with carafate, which has not been making a difference.  Taking tylenol for pain at home with no relief.  Current Medications, Allergies, Past Medical History, Past Surgical History, Family History and Social History were reviewed in [employer] record.   Physical Exam: BP 132/68  Pulse 88  Ht 6\' 2"  (1.88 m)  Wt 227 lb (102.967 kg)  BMI 29.15 kg/m2 General: Well developed , white male in no acute distress Head: Normocephalic and atraumatic Eyes:  sclerae anicteric, conjunctiva pink  Ears: Normal auditory acuity Lungs: Clear throughout to auscultation Heart: Regular rate and rhythm Abdomen: Soft, non-distended.  BS present.  RUQ TTP without R/R/G.  Also some mild lower abdominal TTP. Rectal: Deferred. Musculoskeletal: Symmetrical with no gross deformities    Extremities: No edema  Neurological: Alert oriented x 4, grossly nonfocal Psychological:  Alert and cooperative. Normal mood and affect  Assessment and Recommendations: -RUQ abdominal pain:  Occurring since colonoscopy.  Worsened by movement.  ? Muscular etiology like abdominal wall strain vs gallbladder source.  Will repeat CBC since he did have an elevated WBC count on 1/14.  Also repeat LFT's.  Will treat with Naproxen twice daily for two weeks and vicodin as needed for severe pain over the next couple of days.  Discussed with Dr. Leone Payor.

## 2012-11-03 NOTE — Progress Notes (Signed)
Quick Note:  Diminutive adenoma Repeat colonoscopy 5 years (start thinking about it then) 10/2017 ______

## 2012-11-07 ENCOUNTER — Encounter: Payer: Self-pay | Admitting: Cardiology

## 2012-11-07 ENCOUNTER — Other Ambulatory Visit: Payer: Self-pay | Admitting: *Deleted

## 2012-11-07 ENCOUNTER — Ambulatory Visit (INDEPENDENT_AMBULATORY_CARE_PROVIDER_SITE_OTHER): Payer: BC Managed Care – PPO | Admitting: Cardiology

## 2012-11-07 ENCOUNTER — Other Ambulatory Visit (INDEPENDENT_AMBULATORY_CARE_PROVIDER_SITE_OTHER): Payer: BC Managed Care – PPO

## 2012-11-07 VITALS — BP 142/80 | HR 111 | Ht 74.0 in | Wt 230.8 lb

## 2012-11-07 DIAGNOSIS — E785 Hyperlipidemia, unspecified: Secondary | ICD-10-CM

## 2012-11-07 DIAGNOSIS — IMO0002 Reserved for concepts with insufficient information to code with codable children: Secondary | ICD-10-CM

## 2012-11-07 DIAGNOSIS — R0989 Other specified symptoms and signs involving the circulatory and respiratory systems: Secondary | ICD-10-CM

## 2012-11-07 DIAGNOSIS — G4700 Insomnia, unspecified: Secondary | ICD-10-CM

## 2012-11-07 DIAGNOSIS — I10 Essential (primary) hypertension: Secondary | ICD-10-CM

## 2012-11-07 LAB — LIPID PANEL
Cholesterol: 115 mg/dL (ref 0–200)
HDL: 15.6 mg/dL — ABNORMAL LOW (ref >39.00)
LDL Cholesterol: 70 mg/dL (ref 0–99)
VLDL: 29.2 mg/dL (ref 0.0–40.0)

## 2012-11-07 LAB — HEPATIC FUNCTION PANEL
Bilirubin, Direct: 0.1 mg/dL (ref 0.0–0.3)
Total Bilirubin: 0.5 mg/dL (ref 0.3–1.2)
Total Protein: 6.9 g/dL (ref 6.0–8.3)

## 2012-11-07 MED ORDER — AMLODIPINE BESYLATE 10 MG PO TABS: 10.0000 mg | ORAL_TABLET | Freq: Every day | ORAL | Status: DC

## 2012-11-07 NOTE — Patient Instructions (Addendum)
Increase amlodipine to 10mg  daily. You can take 2 of your 5mg  tablets daily at same time and use your current supply.  Your physician recommends that you have  lab work today--lipid profile /liver profile.  Your physician wants you to follow-up in: 1 year with Dr Shirlee Latch. Sherrie Mustache 2015). You will receive a reminder letter in the mail two months in advance. If you don't receive a letter, please call our office to schedule the follow-up appointment.

## 2012-11-07 NOTE — Progress Notes (Signed)
Patient ID: Derrick Brown, male   DOB: 1951-03-15, 62 y.o.   MRN: [id number] PCP: Dr. Tawanna Cooler  62 yo with multiple cardiac risk factors including type II diabetes, hyperlipidemia, and HTN returns for cardiology evaluation.  His father and uncles additionally all had a history of CAD.  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  He is a nonsmoker.  Patient has not had any chest pain or pressure.  His most strenuous activity is bird hunting (walking in fields often for long distances).  He does this 1-2 times a month in season.  Otherwise, his most activity is walking the car lot at work.  He does not get exertional dyspnea with these activities.  However, on occasion, he will get short of breath walking to his mailbox (a distance of about 50 yards). This does not happen every time and he is unsure about what in particular triggers the dyspnea when it does occur.     I had him do an ETT.  The treadmill malfunctioned so he was stopped early but did reach target heart rate.  No ECG changes but he did have a hypertensive blood pressure response.  He did not have chest pain. BP is still running high, 142/80 in the office today.  Main complaint recently has been RUQ pain that has been present since he had his colonoscopy. He has tolerated taking atorvastatin every other day.   Labs (9/13): K 4.2, creatinine 0.8, HDL 26, LDL 95, TGs 296, TSH normal  PMH: 1. Type II diabetes 2. Hyperlipidemia: Myalgias when he take atorvastatin daily.  3. HTN: rash with HCTZ 4. ETT (12/13): No ischemic ST changes.  He reached target heart rate but test was stopped early because of treadmill malfunction.  Hypertensive BP response.   SH: Nonsmoker.  Lives in [place].  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   FH: Father with MI at 60, 3 uncles with CABG or MIs.   ROS: All systems reviewed and negative except as per HPI.   Current Outpatient Prescriptions    Medication Sig Dispense Refill  . aspirin 81 MG tablet Take 81 mg by mouth daily.        Marland Kitchen atorvastatin (LIPITOR) 20 MG tablet 1 every other day  30 tablet  3  . Coenzyme Q10 200 MG TABS Take 1 daily      . FREESTYLE LITE test strip USE TO TEST GLUCOSE DAILY OR AS DIRECTED BY YOUR DOCTOR  100 each  3  . losartan (COZAAR) 100 MG tablet Take 1 tablet (100 mg total) by mouth daily.  100 tablet  3  . metFORMIN (GLUCOPHAGE) 1000 MG tablet One tablet before breakfast and a half a tablet before your evening meal  150 tablet  3  . Multiple Vitamin (MULTIVITAMIN) tablet Take 1 tablet by mouth daily.        . Omega-3 Fatty Acids (FISH OIL) 1000 MG CAPS Take by mouth daily.        . pantoprazole (PROTONIX) 40 MG tablet Take 1 tablet (40 mg total) by mouth daily.  100 tablet  3  . zolpidem (AMBIEN) 5 MG tablet One half tablet each bedtime when necessary  60 tablet  3  . amLODipine (NORVASC) 10 MG tablet Take 1 tablet (10 mg total) by mouth daily.  90 tablet  3  . HYDROcodone-acetaminophen (NORCO/VICODIN) 5-325 MG per tablet Take 1 tablet by mouth every 6 (six)  hours as needed for pain.  30 tablet  0  . naproxen (NAPROSYN) 250 MG tablet Take 1 tab by mouth every 12 hours as needed for pain.  28 tablet  0  . sucralfate (CARAFATE) 1 G tablet Take 1 tablet (1 g total) by mouth 4 (four) times daily.  30 tablet  0  . SUPREP BOWEL PREP SOLN         BP 142/80  Pulse 111  Ht 6\' 2"  (1.88 m)  Wt 230 lb 12.8 oz (104.69 kg)  BMI 29.63 kg/m2  SpO2 99% General: NAD, overweight.  Neck: No JVD, no thyromegaly or thyroid nodule.  Lungs: Clear to auscultation bilaterally with normal respiratory effort. CV: Nondisplaced PMI.  Heart regular S1/S2, no S3/S4, no murmur.  No peripheral edema.  No carotid bruit.  Normal pedal pulses.  Abdomen: Soft, nontender, no hepatosplenomegaly, no distention.  Neurologic: Alert and oriented x 3.  Psych: Normal affect. Extremities: No clubbing or cyanosis.   Assessment/Plan:  1.  CAD risk: Patient has multiple risk factors for CAD including DM, hyperlipidemia, HTN, and family history (though not premature family history).  He is not a smoker.  He does not have much in terms of exertional symptoms: only occasional dypsnea when walking to his mailbox (he is able to do more heavy activities without trouble, it seems).  ETT showed no evidence for obstructive CAD.  - Continue ASA 81 mg daily - Continue aggressive preventive care.  2. HTN: BP is too high still.  Increase amlodipine to 10 mg daily.  3. Hyperlipidemia: LDL 95 with HDL low and TGs high.  Given multiple risk factors and strong family history, I would like to try to be more aggressive with statin.  He has tolerated every other day atorvastatin and coenzyme Q 10 200 mg daily.  Check lipids/LFTs today.    Marca Ancona 11/07/2012 9:37 AM

## 2013-03-23 ENCOUNTER — Other Ambulatory Visit: Payer: Self-pay | Admitting: Dermatology

## 2013-03-27 ENCOUNTER — Telehealth: Payer: Self-pay | Admitting: Family Medicine

## 2013-03-27 NOTE — Telephone Encounter (Signed)
Pt's left ear is hurting, jar pops/hurts when eating. Pt works in [employer] ans would like to come by first thing in the am to see Dr Tawanna Cooler. Pt doesn't leave work until after 7pm.  Pls advise.

## 2013-03-27 NOTE — Telephone Encounter (Signed)
Patient can be seen Wednesday at 8:15 Wednesday.  Dr Tawanna Cooler is coming in late tomorrow

## 2013-03-27 NOTE — Telephone Encounter (Signed)
appt made/kh 

## 2013-03-29 ENCOUNTER — Ambulatory Visit (INDEPENDENT_AMBULATORY_CARE_PROVIDER_SITE_OTHER): Payer: BC Managed Care – PPO | Admitting: Family Medicine

## 2013-03-29 ENCOUNTER — Encounter: Payer: Self-pay | Admitting: Family Medicine

## 2013-03-29 VITALS — BP 130/80 | Temp 98.5°F | Wt 232.0 lb

## 2013-03-29 DIAGNOSIS — M26609 Unspecified temporomandibular joint disorder, unspecified side: Secondary | ICD-10-CM

## 2013-03-29 MED ORDER — AMITRIPTYLINE HCL 10 MG PO TABS: 10.0000 mg | ORAL_TABLET | Freq: Every day | ORAL | Status: DC

## 2013-03-29 NOTE — Progress Notes (Signed)
  Subjective:    Patient ID: Derrick Brown, male    DOB: 03/21/1951, 62 y.o.   MRN: [id number]  HPI Derrick Brown is a 12 -year-old male married nonsmoker who comes in today for evaluation of a left-sided earache  He's also had a sensation of popping in his jaw  He has a history of underlying hypertension BP today 130/80. He's also diabetic on metformin.  He does have some allergic rhinitis.   Review of Systems Review of systems negative    Objective:   Physical Exam  Well-developed well-nourished male no acute distress ENT exam negative tenderness left TMJ      Assessment & Plan:  TMJ left,,,,,,,,,,, plan see orders

## 2013-03-29 NOTE — Patient Instructions (Signed)
Motrin 400 mg twice daily with food  Soft diet  No chewing gum  Mouthguard,,,,,,,,,,,,,, omega sports  Elavil 10 mg at bedtime  Do the above until the pain is gone and then stop

## 2013-06-05 ENCOUNTER — Other Ambulatory Visit: Payer: Self-pay | Admitting: Family Medicine

## 2013-06-24 ENCOUNTER — Other Ambulatory Visit: Payer: Self-pay | Admitting: Family Medicine

## 2013-06-29 ENCOUNTER — Ambulatory Visit (INDEPENDENT_AMBULATORY_CARE_PROVIDER_SITE_OTHER): Payer: BC Managed Care – PPO

## 2013-06-29 DIAGNOSIS — Z23 Encounter for immunization: Secondary | ICD-10-CM

## 2013-07-04 ENCOUNTER — Other Ambulatory Visit: Payer: BC Managed Care – PPO

## 2013-07-10 ENCOUNTER — Encounter: Payer: BC Managed Care – PPO | Admitting: Family Medicine

## 2013-08-07 ENCOUNTER — Other Ambulatory Visit: Payer: Self-pay | Admitting: Family Medicine

## 2013-09-07 IMAGING — CT CT ABD-PELV W/ CM
1 of 2 series · 14 of 32 positions shown, 19 images · IV contrast (OMNIPAQUE 300)
Comparison: Plain films earlier today.

CLINICAL DATA: Right upper quadrant pain, nausea.

CT ABDOMEN AND PELVIS WITH CONTRAST
TECHNIQUE: Multidetector CT imaging of the abdomen and pelvis was
performed following the standard protocol during bolus
administration of intravenous contrast.
Contrast: 100mL OMNIPAQUE IOHEXOL 300 MG/ML  SOLN

[Series 2: abd/pel with · axial · 0.74mm/px · z∈[-118,+366]mm · 14 of 109 slices shown, 19 images]
[im 6/109  soft-tissue]
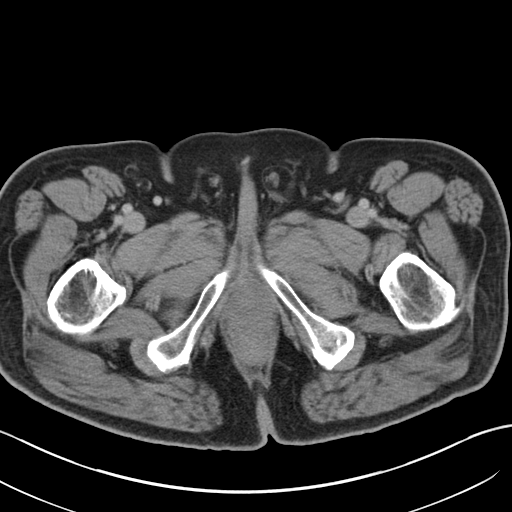
[im 6/109  bone]
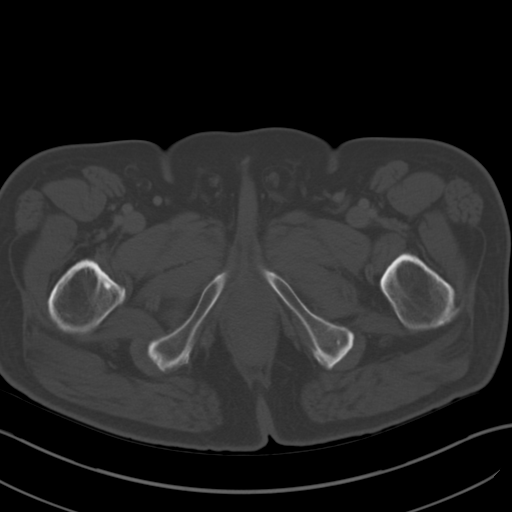
[im 17/109  soft-tissue]
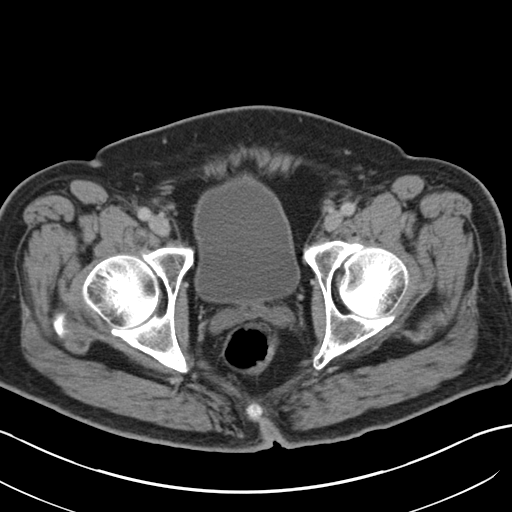
[im 22/109  soft-tissue]
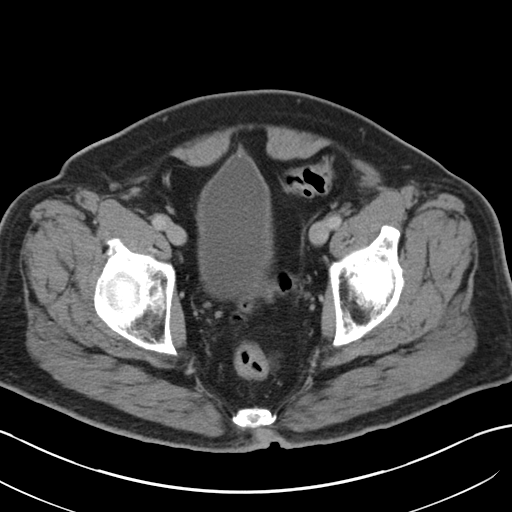
[im 33/109  soft-tissue]
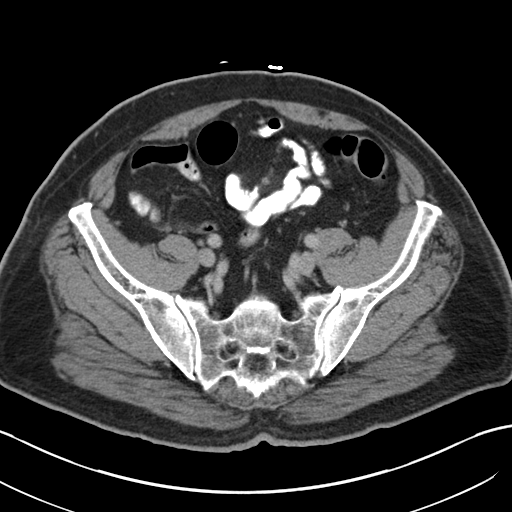
[im 38/109  soft-tissue]
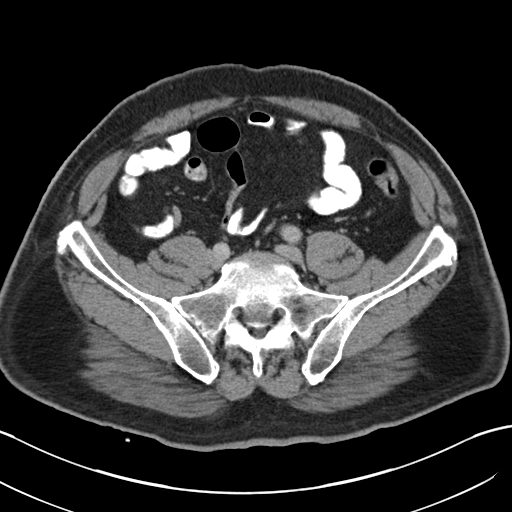
[im 49/109  soft-tissue]
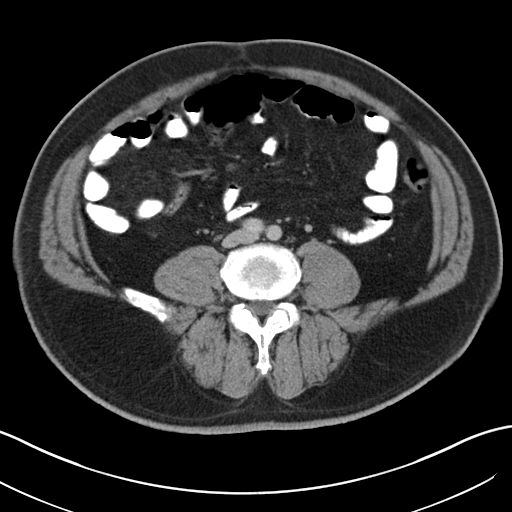
[im 55/109  soft-tissue]
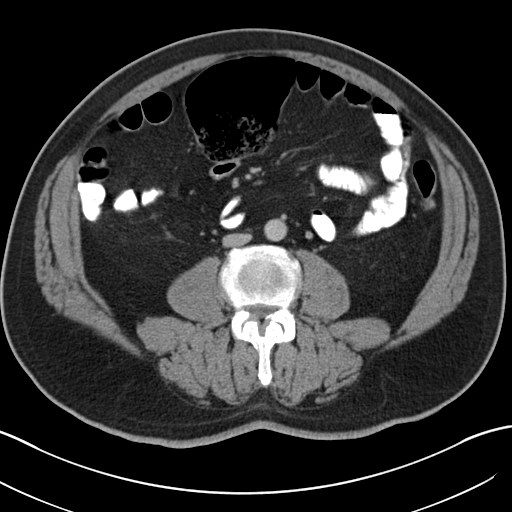
[im 60/109  soft-tissue]
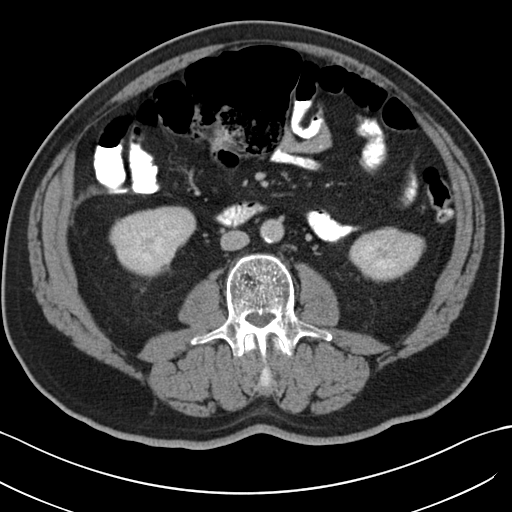
[im 71/109  soft-tissue]
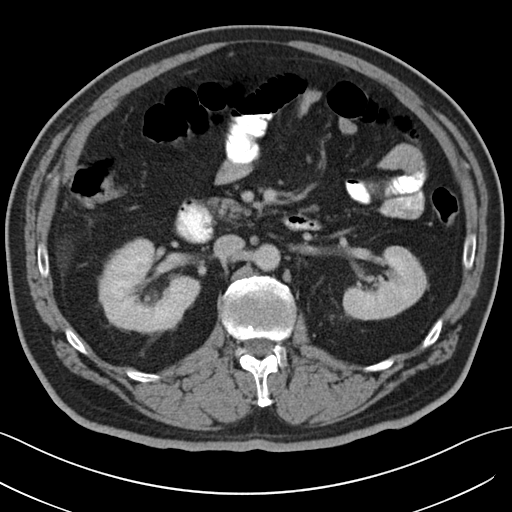
[im 71/109  bone]
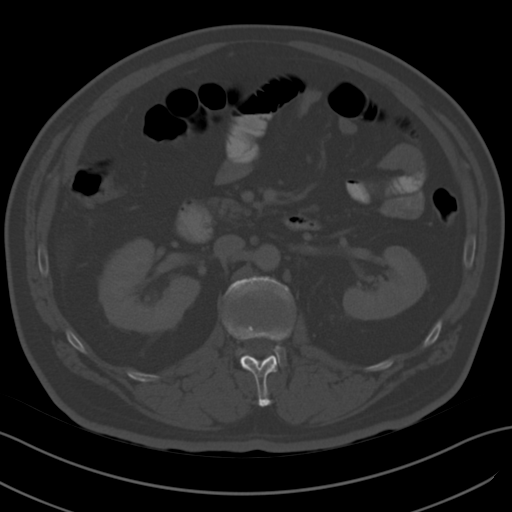
[im 76/109  soft-tissue]
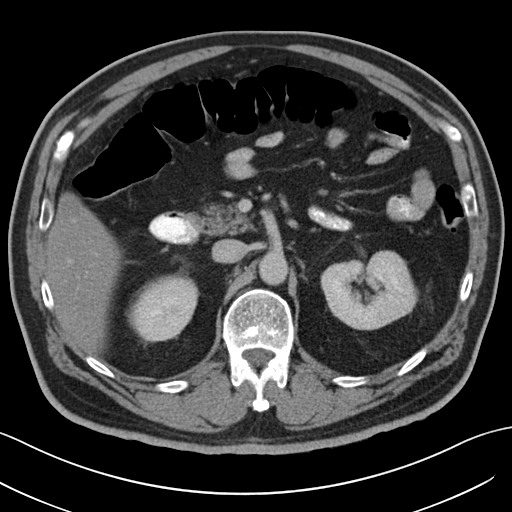
[im 87/109  soft-tissue]
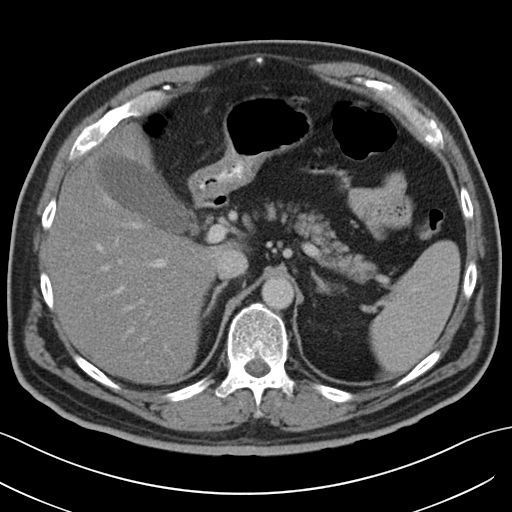
[im 87/109  lung]
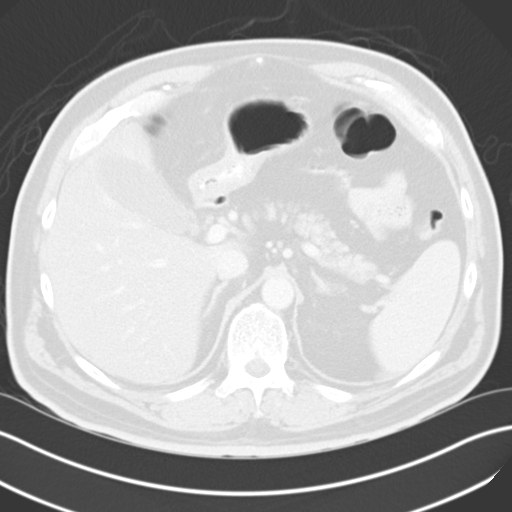
[im 92/109  soft-tissue]
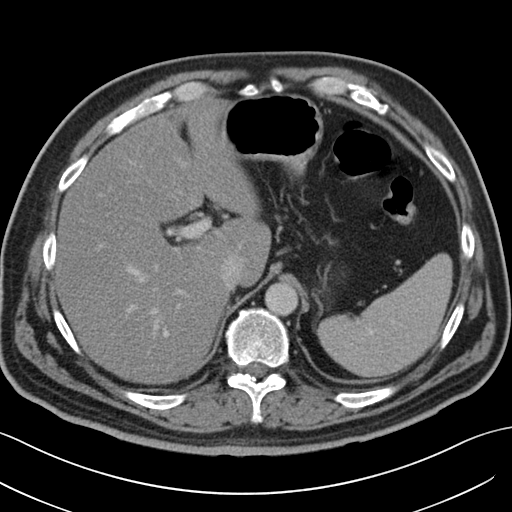
[im 92/109  lung]
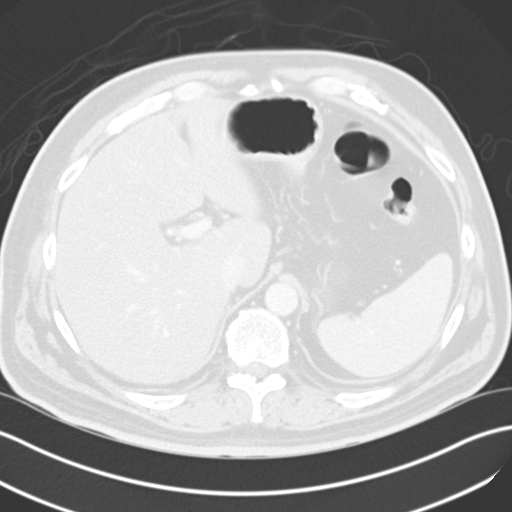
[im 98/109  lung]
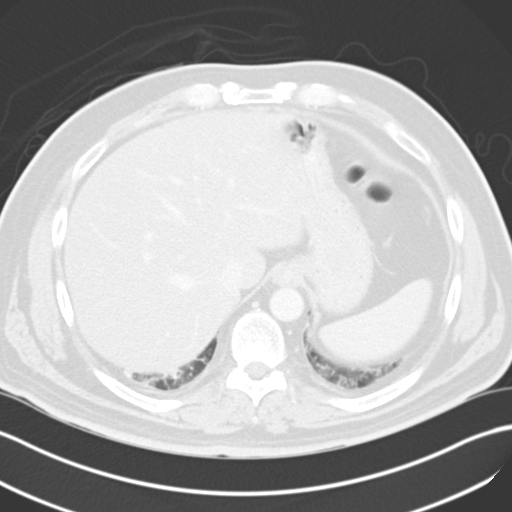
[im 103/109  soft-tissue]
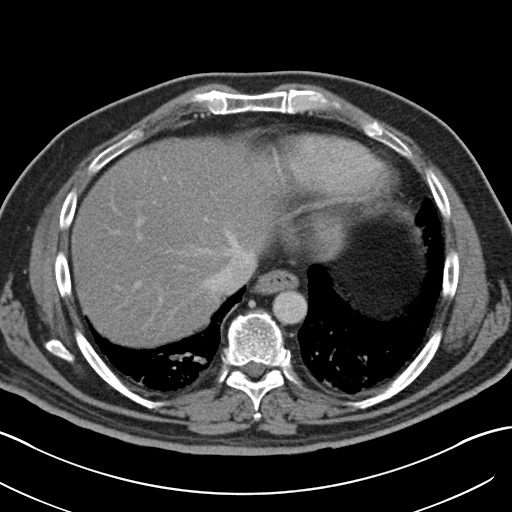
[im 103/109  lung]
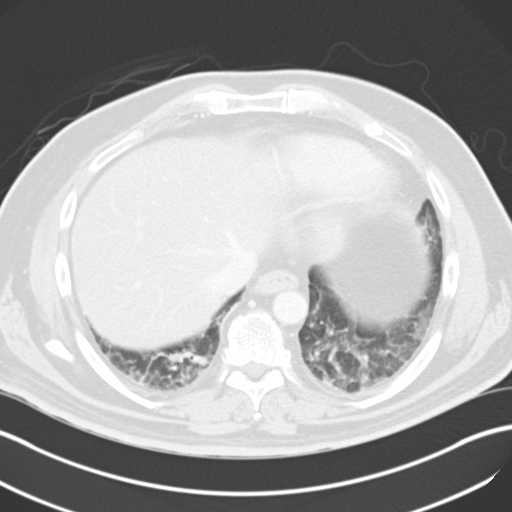

[14 of 32 positions shown; findings below may reference images not displayed]

FINDINGS: Mild vascular congestion in the visualized lower lungs.
Bibasilar atelectasis.  No effusions.  Heart is upper limits normal
in size.

There is mild fatty infiltration of the liver.  Areas of focal
fatty sparing adjacent to the gallbladder fossa.  No focal lesion.
Spleen, pancreas, adrenals are unremarkable. Small bilateral
nonobstructing lower pole renal stones.  No hydronephrosis.
Kidneys enhance symmetrically without focal abnormality.  Ureters
are decompressed.  Urinary bladder grossly unremarkable.

Scattered descending colonic and sigmoid diverticulosis.  No active
diverticulitis.  No free fluid, free air or adenopathy.  Appendix
is not definitively seen.  Moderate gas and stool noted within the
cecum.  No inflammatory process in the right lower quadrant.

Scattered calcifications in the aorta.  No aneurysm.  No acute bony
abnormality.
IMPRESSION: Small bilateral nonobstructing lower pole renal calculi.  No
ureteral stones or hydronephrosis.

Fatty infiltration of the liver.

No acute findings in the abdomen or pelvis.

## 2013-09-08 ENCOUNTER — Other Ambulatory Visit (INDEPENDENT_AMBULATORY_CARE_PROVIDER_SITE_OTHER): Payer: BC Managed Care – PPO

## 2013-09-08 DIAGNOSIS — Z0000 Encounter for general adult medical examination without abnormal findings: Secondary | ICD-10-CM

## 2013-09-08 LAB — LIPID PANEL
Cholesterol: 138 mg/dL (ref 0–200)
LDL Cholesterol: 75 mg/dL (ref 0–99)
Total CHOL/HDL Ratio: 5
VLDL: 36.8 mg/dL (ref 0.0–40.0)

## 2013-09-08 LAB — CBC WITH DIFFERENTIAL/PLATELET
Basophils Relative: 0.5 % (ref 0.0–3.0)
Eosinophils Relative: 4.9 % (ref 0.0–5.0)
HCT: 40.4 % (ref 39.0–52.0)
Hemoglobin: 13.8 g/dL (ref 13.0–17.0)
Lymphs Abs: 1.9 10*3/uL (ref 0.7–4.0)
MCV: 90.3 fl (ref 78.0–100.0)
Monocytes Absolute: 0.7 10*3/uL (ref 0.1–1.0)
Monocytes Relative: 9.8 % (ref 3.0–12.0)
Neutro Abs: 3.8 10*3/uL (ref 1.4–7.7)
Platelets: 245 10*3/uL (ref 150.0–400.0)
WBC: 6.7 10*3/uL (ref 4.5–10.5)

## 2013-09-08 LAB — MICROALBUMIN / CREATININE URINE RATIO
Creatinine,U: 186.3 mg/dL
Microalb Creat Ratio: 0.8 mg/g (ref 0.0–30.0)

## 2013-09-08 LAB — POCT URINALYSIS DIPSTICK
Blood, UA: NEGATIVE
Protein, UA: NEGATIVE
Spec Grav, UA: 1.025
Urobilinogen, UA: 0.2
pH, UA: 6

## 2013-09-08 LAB — TSH: TSH: 2.85 u[IU]/mL (ref 0.35–5.50)

## 2013-09-08 LAB — HEPATIC FUNCTION PANEL
ALT: 46 U/L (ref 0–53)
AST: 23 U/L (ref 0–37)
Albumin: 4.1 g/dL (ref 3.5–5.2)
Total Bilirubin: 0.7 mg/dL (ref 0.3–1.2)
Total Protein: 6.4 g/dL (ref 6.0–8.3)

## 2013-09-08 LAB — BASIC METABOLIC PANEL
BUN: 17 mg/dL (ref 6–23)
Chloride: 105 mEq/L (ref 96–112)
GFR: 130.61 mL/min (ref >60.00)
Potassium: 4.2 mEq/L (ref 3.5–5.1)
Sodium: 138 mEq/L (ref 135–145)

## 2013-09-08 LAB — PSA: PSA: 1.01 ng/mL (ref 0.10–4.00)

## 2013-09-12 ENCOUNTER — Encounter: Payer: BC Managed Care – PPO | Admitting: Family Medicine

## 2013-09-18 ENCOUNTER — Other Ambulatory Visit: Payer: Self-pay | Admitting: Family Medicine

## 2013-09-28 ENCOUNTER — Encounter: Payer: BC Managed Care – PPO | Admitting: Family Medicine

## 2013-10-02 ENCOUNTER — Encounter: Payer: BC Managed Care – PPO | Admitting: Family Medicine

## 2013-10-24 ENCOUNTER — Ambulatory Visit (INDEPENDENT_AMBULATORY_CARE_PROVIDER_SITE_OTHER): Payer: BC Managed Care – PPO | Admitting: Family Medicine

## 2013-10-24 ENCOUNTER — Encounter: Payer: Self-pay | Admitting: Family Medicine

## 2013-10-24 VITALS — BP 130/80 | Temp 98.6°F | Ht 72.5 in | Wt 239.0 lb

## 2013-10-24 DIAGNOSIS — Z23 Encounter for immunization: Secondary | ICD-10-CM

## 2013-10-24 DIAGNOSIS — IMO0002 Reserved for concepts with insufficient information to code with codable children: Secondary | ICD-10-CM

## 2013-10-24 DIAGNOSIS — IMO0001 Reserved for inherently not codable concepts without codable children: Secondary | ICD-10-CM

## 2013-10-24 DIAGNOSIS — N529 Male erectile dysfunction, unspecified: Secondary | ICD-10-CM

## 2013-10-24 DIAGNOSIS — H9319 Tinnitus, unspecified ear: Secondary | ICD-10-CM

## 2013-10-24 DIAGNOSIS — M26609 Unspecified temporomandibular joint disorder, unspecified side: Secondary | ICD-10-CM

## 2013-10-24 DIAGNOSIS — H9312 Tinnitus, left ear: Secondary | ICD-10-CM

## 2013-10-24 DIAGNOSIS — H9192 Unspecified hearing loss, left ear: Secondary | ICD-10-CM

## 2013-10-24 DIAGNOSIS — E1165 Type 2 diabetes mellitus with hyperglycemia: Secondary | ICD-10-CM

## 2013-10-24 DIAGNOSIS — H9190 Unspecified hearing loss, unspecified ear: Secondary | ICD-10-CM

## 2013-10-24 DIAGNOSIS — G4700 Insomnia, unspecified: Secondary | ICD-10-CM

## 2013-10-24 DIAGNOSIS — Z0000 Encounter for general adult medical examination without abnormal findings: Secondary | ICD-10-CM

## 2013-10-24 DIAGNOSIS — I10 Essential (primary) hypertension: Secondary | ICD-10-CM

## 2013-10-24 DIAGNOSIS — E785 Hyperlipidemia, unspecified: Secondary | ICD-10-CM

## 2013-10-24 DIAGNOSIS — H9313 Tinnitus, bilateral: Secondary | ICD-10-CM | POA: Insufficient documentation

## 2013-10-24 DIAGNOSIS — H903 Sensorineural hearing loss, bilateral: Secondary | ICD-10-CM | POA: Insufficient documentation

## 2013-10-24 MED ORDER — AMITRIPTYLINE HCL 10 MG PO TABS: 10.0000 mg | ORAL_TABLET | Freq: Every day | ORAL | Status: DC

## 2013-10-24 MED ORDER — ZOLPIDEM TARTRATE 5 MG PO TABS: ORAL_TABLET | ORAL | Status: DC

## 2013-10-24 MED ORDER — PANTOPRAZOLE SODIUM 40 MG PO TBEC: DELAYED_RELEASE_TABLET | ORAL | Status: DC

## 2013-10-24 MED ORDER — LOSARTAN POTASSIUM 100 MG PO TABS: ORAL_TABLET | ORAL | Status: DC

## 2013-10-24 MED ORDER — AMLODIPINE BESYLATE 10 MG PO TABS: 10.0000 mg | ORAL_TABLET | Freq: Every day | ORAL | Status: DC

## 2013-10-24 MED ORDER — ATORVASTATIN CALCIUM 20 MG PO TABS: ORAL_TABLET | ORAL | Status: DC

## 2013-10-24 MED ORDER — SILDENAFIL CITRATE 50 MG PO TABS: 50.0000 mg | ORAL_TABLET | ORAL | Status: DC | PRN

## 2013-10-24 MED ORDER — METFORMIN HCL 1000 MG PO TABS: ORAL_TABLET | ORAL | Status: DC

## 2013-10-24 NOTE — Progress Notes (Signed)
Pre visit review using our clinic review tool, if applicable. No additional management support is needed unless otherwise documented below in the visit note. 

## 2013-10-24 NOTE — Patient Instructions (Signed)
Continue current medications  Walk 30 minutes daily  Followup on your diabetes in 6 months  Motrin 400 mg twice daily with food for pain in your left shoulder only as needed  Call the ENT office and set up a consult with Dr. Janace Hoard for evaluation the tinnitus and hearing loss

## 2013-10-24 NOTE — Progress Notes (Signed)
Subjective:    Patient ID: Derrick Brown, male    DOB: [birth year], 63 y.o.   MRN: [id number]  HPI Derrick Brown is a [age]-year-old married male nonsmoker who comes in today for general physical examination because of a history of hypertension, sleep dysfunction, hyperlipidemia, diabetes type 2, reflux esophagitis, sleep dysfunction with insomnia, and a new problem of decreased hearing in his left ear with tinnitus.  He also some discomfort in his left shoulder. He went to orthopedics in the past they prescribed physical therapy and anti-inflammatories.  He gets routine eye care, dental care, recent colonoscopy normal except for one polyp. Vaccinations up-to-date shingles shot given today.  She was concerned about his cardiovascular status therefore referred him to Dr. Loralie Champagne last year for evaluation cardiac exam was normal he was advised to continue to address his risk factors  He does not exercise on a regular basis. He says he walks at work all day long.   Review of Systems  Constitutional: Negative.   HENT: Negative.   Eyes: Negative.   Respiratory: Negative.   Cardiovascular: Negative.   Gastrointestinal: Negative.   Endocrine: Negative.   Genitourinary: Negative.   Musculoskeletal: Negative.   Skin: Negative.   Allergic/Immunologic: Negative.   Neurological: Negative.   Hematological: Negative.   Psychiatric/Behavioral: Negative.        Objective:   Physical Exam  Nursing note and vitals reviewed. Constitutional: He is oriented to person, place, and time. He appears well-developed and well-nourished.  HENT:  Head: Normocephalic and atraumatic.  Right Ear: External ear normal.  Left Ear: External ear normal.  Nose: Nose normal.  Mouth/Throat: Oropharynx is clear and moist.  Eyes: Conjunctivae and EOM are normal. Pupils are equal, round, and reactive to light.  Neck: Normal range of motion. Neck supple. No JVD present. No tracheal deviation present. No thyromegaly  present.  Cardiovascular: Normal rate, regular rhythm, normal heart sounds and intact distal pulses.  Exam reveals no gallop and no friction rub.   No murmur heard. No carotid or aortic bruits performed pulses 2+ and symmetrical  Pulmonary/Chest: Effort normal and breath sounds normal. No stridor. No respiratory distress. He has no wheezes. He has no rales. He exhibits no tenderness.  Abdominal: Soft. Bowel sounds are normal. He exhibits no distension and no mass. There is no tenderness. There is no rebound and no guarding.  Genitourinary: Rectum normal, prostate normal and penis normal. Guaiac negative stool. No penile tenderness.  Musculoskeletal: Normal range of motion. He exhibits no edema and no tenderness.  Lymphadenopathy:    He has no cervical adenopathy.  Neurological: He is alert and oriented to person, place, and time. He has normal reflexes. No cranial nerve deficit. He exhibits normal muscle tone.  Skin: Skin is warm and dry. No rash noted. No erythema. No pallor.  Total body skin exam normal  Psychiatric: He has a normal mood and affect. His behavior is normal. Judgment and thought content normal.          Assessment & Plan:  Healthy male  Hypertension at goal continue current therapy  Diabetes approaching goal continue current therapy begin a walking program daily followup A1c in 6 months  Reflux esophagitis continue Protonix  Hyperlipidemia continue Lipitor and an aspirin tablet daily  Sleep dysfunction Ambien when necessary  TMJ syndrome continue Elavil,,,,,,, now the discomfort has resolved but he developed tinnitus and hearing loss in that left ear. We'll refer to ENT for consult  Bursitis left shoulder Motrin 400  twice a day exercise

## 2013-10-25 ENCOUNTER — Telehealth: Payer: Self-pay

## 2013-10-25 NOTE — Telephone Encounter (Signed)
Relevant patient education assigned to patient using Emmi. ° °

## 2013-10-30 ENCOUNTER — Other Ambulatory Visit: Payer: Self-pay | Admitting: Family Medicine

## 2013-10-30 DIAGNOSIS — IMO0002 Reserved for concepts with insufficient information to code with codable children: Secondary | ICD-10-CM

## 2013-10-30 DIAGNOSIS — E1165 Type 2 diabetes mellitus with hyperglycemia: Secondary | ICD-10-CM

## 2013-11-13 LAB — HM DIABETES EYE EXAM

## 2013-11-16 ENCOUNTER — Encounter: Payer: Self-pay | Admitting: Family Medicine

## 2013-11-21 ENCOUNTER — Telehealth: Payer: Self-pay | Admitting: Family Medicine

## 2013-11-21 NOTE — Telephone Encounter (Signed)
Relevant patient education assigned to patient using Emmi. ° °

## 2014-03-17 ENCOUNTER — Encounter: Payer: Self-pay | Admitting: Family Medicine

## 2014-03-19 MED ORDER — HYDROCHLOROTHIAZIDE 12.5 MG PO CAPS: 12.5000 mg | ORAL_CAPSULE | Freq: Every day | ORAL | Status: DC

## 2014-04-23 ENCOUNTER — Other Ambulatory Visit (INDEPENDENT_AMBULATORY_CARE_PROVIDER_SITE_OTHER): Payer: BC Managed Care – PPO

## 2014-04-23 DIAGNOSIS — IMO0002 Reserved for concepts with insufficient information to code with codable children: Secondary | ICD-10-CM

## 2014-04-23 DIAGNOSIS — E1165 Type 2 diabetes mellitus with hyperglycemia: Secondary | ICD-10-CM

## 2014-04-23 DIAGNOSIS — IMO0001 Reserved for inherently not codable concepts without codable children: Secondary | ICD-10-CM

## 2014-04-23 LAB — BASIC METABOLIC PANEL
BUN: 15 mg/dL (ref 6–23)
CALCIUM: 9.6 mg/dL (ref 8.4–10.5)
CO2: 27 mEq/L (ref 19–32)
CREATININE: 0.8 mg/dL (ref 0.4–1.5)
Chloride: 104 mEq/L (ref 96–112)
GFR: 112.46 mL/min (ref >60.00)
Glucose, Bld: 195 mg/dL — ABNORMAL HIGH (ref 70–99)
Potassium: 4 mEq/L (ref 3.5–5.1)
Sodium: 139 mEq/L (ref 135–145)

## 2014-04-23 LAB — MICROALBUMIN / CREATININE URINE RATIO
Creatinine,U: 187.1 mg/dL
MICROALB UR: 2 mg/dL — AB (ref 0.0–1.9)
Microalb Creat Ratio: 1.1 mg/g (ref 0.0–30.0)

## 2014-04-23 LAB — HEMOGLOBIN A1C: Hgb A1c MFr Bld: 7.7 % — ABNORMAL HIGH (ref 4.6–6.5)

## 2014-04-30 ENCOUNTER — Ambulatory Visit (INDEPENDENT_AMBULATORY_CARE_PROVIDER_SITE_OTHER): Payer: BC Managed Care – PPO | Admitting: Family Medicine

## 2014-04-30 ENCOUNTER — Encounter: Payer: Self-pay | Admitting: Family Medicine

## 2014-04-30 VITALS — BP 130/80 | Temp 98.8°F | Wt 240.0 lb

## 2014-04-30 DIAGNOSIS — IMO0001 Reserved for inherently not codable concepts without codable children: Secondary | ICD-10-CM

## 2014-04-30 DIAGNOSIS — IMO0002 Reserved for concepts with insufficient information to code with codable children: Secondary | ICD-10-CM

## 2014-04-30 DIAGNOSIS — E1165 Type 2 diabetes mellitus with hyperglycemia: Secondary | ICD-10-CM

## 2014-04-30 MED ORDER — METFORMIN HCL 1000 MG PO TABS: ORAL_TABLET | ORAL | Status: DC

## 2014-04-30 NOTE — Patient Instructions (Signed)
Metformin 1000 mg.......Marland Kitchen 1 before breakfast........Marland Kitchen 1 before your evening meal  Walk 15 minutes daily  Check a blood sugar Monday Wednesday Friday  Followup in 3 months  Labs one week prior 11:30 AM or 4:30 PM

## 2014-04-30 NOTE — Progress Notes (Signed)
Pre visit review using our clinic review tool, if applicable. No additional management support is needed unless otherwise documented below in the visit note. 

## 2014-04-30 NOTE — Progress Notes (Signed)
   Subjective:    Patient ID: Derrick Brown, male    DOB: [birth year], 63 y.o.   MRN: [id number]  HPI [name] is a 63 year old male who comes in today for followup of diabetes type 2  6 he's taking metformin 1000 mg the morning 500 prior to his evening meal. He states he's fine a diet walks about a mile at work weight steady at 240 pounds. A1c last week was 7.7 sugar 195.   Review of Systems    review of systems otherwise negative Objective:   Physical Exam  This well-developed well-nourished male no acute distress vital signs stable he is afebrile is      Assessment & Plan:  , 6 diabetes type 2,,,,,,, not at goal,,,,, increase metformin 1000 twice a day walk 20 minutes above his daily exercise and followup in 3 months

## 2014-05-16 ENCOUNTER — Encounter: Payer: Self-pay | Admitting: Family Medicine

## 2014-07-20 ENCOUNTER — Other Ambulatory Visit (INDEPENDENT_AMBULATORY_CARE_PROVIDER_SITE_OTHER): Payer: BC Managed Care – PPO

## 2014-07-20 DIAGNOSIS — IMO0002 Reserved for concepts with insufficient information to code with codable children: Secondary | ICD-10-CM

## 2014-07-20 DIAGNOSIS — E1165 Type 2 diabetes mellitus with hyperglycemia: Secondary | ICD-10-CM

## 2014-07-20 DIAGNOSIS — Z23 Encounter for immunization: Secondary | ICD-10-CM

## 2014-07-20 LAB — BASIC METABOLIC PANEL
BUN: 11 mg/dL (ref 6–23)
CALCIUM: 9.2 mg/dL (ref 8.4–10.5)
CO2: 26 meq/L (ref 19–32)
Chloride: 104 mEq/L (ref 96–112)
Creatinine, Ser: 0.7 mg/dL (ref 0.4–1.5)
GFR: 119.71 mL/min (ref >60.00)
GLUCOSE: 174 mg/dL — AB (ref 70–99)
Potassium: 3.6 mEq/L (ref 3.5–5.1)
Sodium: 137 mEq/L (ref 135–145)

## 2014-07-20 LAB — HEMOGLOBIN A1C: Hgb A1c MFr Bld: 7.4 % — ABNORMAL HIGH (ref 4.6–6.5)

## 2014-07-31 ENCOUNTER — Encounter: Payer: Self-pay | Admitting: Family Medicine

## 2014-07-31 ENCOUNTER — Ambulatory Visit (INDEPENDENT_AMBULATORY_CARE_PROVIDER_SITE_OTHER): Payer: BC Managed Care – PPO | Admitting: Family Medicine

## 2014-07-31 VITALS — BP 130/80 | Temp 98.5°F | Wt 237.0 lb

## 2014-07-31 DIAGNOSIS — O10019 Pre-existing essential hypertension complicating pregnancy, unspecified trimester: Secondary | ICD-10-CM

## 2014-07-31 DIAGNOSIS — K2100 Gastro-esophageal reflux disease with esophagitis, without bleeding: Secondary | ICD-10-CM

## 2014-07-31 DIAGNOSIS — IMO0002 Reserved for concepts with insufficient information to code with codable children: Secondary | ICD-10-CM

## 2014-07-31 DIAGNOSIS — I10 Essential (primary) hypertension: Secondary | ICD-10-CM

## 2014-07-31 DIAGNOSIS — E780 Pure hypercholesterolemia: Secondary | ICD-10-CM

## 2014-07-31 DIAGNOSIS — E7801 Familial hypercholesterolemia: Secondary | ICD-10-CM

## 2014-07-31 DIAGNOSIS — E1165 Type 2 diabetes mellitus with hyperglycemia: Secondary | ICD-10-CM

## 2014-07-31 MED ORDER — METFORMIN HCL 1000 MG PO TABS: ORAL_TABLET | ORAL | Status: DC

## 2014-07-31 MED ORDER — AMLODIPINE BESYLATE 10 MG PO TABS: 10.0000 mg | ORAL_TABLET | Freq: Every day | ORAL | Status: DC

## 2014-07-31 MED ORDER — PANTOPRAZOLE SODIUM 40 MG PO TBEC: DELAYED_RELEASE_TABLET | ORAL | Status: DC

## 2014-07-31 MED ORDER — GLIPIZIDE 5 MG PO TABS: ORAL_TABLET | ORAL | Status: DC

## 2014-07-31 MED ORDER — LOSARTAN POTASSIUM 100 MG PO TABS: ORAL_TABLET | ORAL | Status: DC

## 2014-07-31 MED ORDER — ATORVASTATIN CALCIUM 20 MG PO TABS: ORAL_TABLET | ORAL | Status: DC

## 2014-07-31 NOTE — Progress Notes (Signed)
Pre visit review using our clinic review tool, if applicable. No additional management support is needed unless otherwise documented below in the visit note. Lab Results  Component Value Date   HGBA1C 7.4* 07/20/2014   HGBA1C 7.7* 04/23/2014   HGBA1C 7.2* 09/08/2013   Lab Results  Component Value Date   MICROALBUR 2.0* 04/23/2014   LDLCALC 75 09/08/2013   CREATININE 0.7 07/20/2014

## 2014-07-31 NOTE — Patient Instructions (Signed)
Continue your current medications  At the glipizide 5 mg............. one tablet daily in the morning with breakfast  Followup in 2 months........... complete physical exam  Labs one week prior  Walk 30 minutes daily

## 2014-07-31 NOTE — Progress Notes (Signed)
   Subjective:    Patient ID: Derrick Brown, male    DOB: 02-Sep-1951, 63 y.o.   MRN: [id number]  HPI Mr. Lodes is a 63 year old married male nonsmoker who comes in today for followup of diabetes type 2. 3 months ago we increased his metformin to 1000 mg twice a day because his A1c was 7.7% and his blood sugar was 195  Now his A1c is 7.4 blood sugar 174  Weight is stable at 237 pounds.  He's doing no exercise at all except he works 12 hours a day he is an Programme researcher, broadcasting/film/video at the [employer]   Review of Systems    review of systems negative Objective:   Physical Exam  Well-developed well-nourished male no acute distress vital signs stable he is afebrile BP ago 130/80      Assessment & Plan:  Diabetes type 2 not at goal............. stress 30 minutes of walking daily......... at glipizide 5 mg daily......... followup A1c in 3 months  Hypertension at goal continue current therapy

## 2014-08-03 ENCOUNTER — Encounter: Payer: Self-pay | Admitting: Internal Medicine

## 2014-08-06 ENCOUNTER — Ambulatory Visit (INDEPENDENT_AMBULATORY_CARE_PROVIDER_SITE_OTHER): Payer: BC Managed Care – PPO | Admitting: Family Medicine

## 2014-08-06 ENCOUNTER — Encounter: Payer: Self-pay | Admitting: Family Medicine

## 2014-08-06 DIAGNOSIS — D229 Melanocytic nevi, unspecified: Secondary | ICD-10-CM

## 2014-08-06 DIAGNOSIS — G4700 Insomnia, unspecified: Secondary | ICD-10-CM

## 2014-08-06 DIAGNOSIS — L570 Actinic keratosis: Secondary | ICD-10-CM

## 2014-08-06 DIAGNOSIS — D2272 Melanocytic nevi of left lower limb, including hip: Secondary | ICD-10-CM

## 2014-08-06 DIAGNOSIS — D2372 Other benign neoplasm of skin of left lower limb, including hip: Secondary | ICD-10-CM

## 2014-08-06 DIAGNOSIS — Z23 Encounter for immunization: Secondary | ICD-10-CM

## 2014-08-06 MED ORDER — ZOLPIDEM TARTRATE 5 MG PO TABS: ORAL_TABLET | ORAL | Status: DC

## 2014-08-06 NOTE — Progress Notes (Signed)
   Subjective:    Patient ID: Derrick Brown, male    DOB: [birth year], 63 y.o.   MRN: [id number]  HPI Derrick Brown is a 63 year old married male nonsmoker who comes in today for removal of a nonhealing lesion on his left lower leg.  He noticed a red lesion has been increasing in size an old healed.  Lesion measures 8 mm x 8 mm  After informed consent the lesion was cleaned with alcohol and anesthetized with 1% Xylocaine with epinephrine and removed in toto. The bases cauterized Band-Aids was applied the lesion was sent for pathologic analysis. Review of Systems    review of systems negative  Objective:   Physical Exam   procedure see above      Assessment & Plan:   Clinically appears to be a dysplastic nevi past pending may be superficial basal cell cancer

## 2014-08-06 NOTE — Patient Instructions (Signed)
We will call you within 2 weeks with a report

## 2014-09-06 ENCOUNTER — Encounter: Payer: Self-pay | Admitting: Family Medicine

## 2014-09-06 DIAGNOSIS — IMO0002 Reserved for concepts with insufficient information to code with codable children: Secondary | ICD-10-CM

## 2014-09-06 DIAGNOSIS — E1165 Type 2 diabetes mellitus with hyperglycemia: Secondary | ICD-10-CM

## 2014-09-06 MED ORDER — METFORMIN HCL 1000 MG PO TABS: ORAL_TABLET | ORAL | Status: DC

## 2014-09-06 NOTE — Telephone Encounter (Signed)
New rx sent

## 2014-10-18 ENCOUNTER — Other Ambulatory Visit (INDEPENDENT_AMBULATORY_CARE_PROVIDER_SITE_OTHER): Payer: BC Managed Care – PPO

## 2014-10-18 DIAGNOSIS — R790 Abnormal level of blood mineral: Secondary | ICD-10-CM

## 2014-10-18 DIAGNOSIS — Z0000 Encounter for general adult medical examination without abnormal findings: Secondary | ICD-10-CM

## 2014-10-18 LAB — MICROALBUMIN / CREATININE URINE RATIO
Creatinine,U: 269.4 mg/dL
Microalb Creat Ratio: 1 mg/g (ref 0.0–30.0)
Microalb, Ur: 2.6 mg/dL — ABNORMAL HIGH (ref 0.0–1.9)

## 2014-10-18 LAB — POCT URINALYSIS DIPSTICK
BILIRUBIN UA: NEGATIVE
Blood, UA: NEGATIVE
Glucose, UA: NEGATIVE
Ketones, UA: NEGATIVE
LEUKOCYTES UA: NEGATIVE
Nitrite, UA: NEGATIVE
PH UA: 5
Protein, UA: NEGATIVE
Spec Grav, UA: 1.02
Urobilinogen, UA: 0.2

## 2014-10-18 LAB — CBC WITH DIFFERENTIAL/PLATELET
Basophils Absolute: 0.1 10*3/uL (ref 0.0–0.1)
Basophils Relative: 0.8 % (ref 0.0–3.0)
EOS ABS: 0.4 10*3/uL (ref 0.0–0.7)
EOS PCT: 5.5 % — AB (ref 0.0–5.0)
HEMATOCRIT: 44.3 % (ref 39.0–52.0)
Hemoglobin: 14.8 g/dL (ref 13.0–17.0)
LYMPHS ABS: 2.4 10*3/uL (ref 0.7–4.0)
Lymphocytes Relative: 31.2 % (ref 12.0–46.0)
MCHC: 33.3 g/dL (ref 30.0–36.0)
MCV: 92.2 fl (ref 78.0–100.0)
MONO ABS: 0.7 10*3/uL (ref 0.1–1.0)
Monocytes Relative: 9.6 % (ref 3.0–12.0)
NEUTROS ABS: 4.1 10*3/uL (ref 1.4–7.7)
Neutrophils Relative %: 52.9 % (ref 43.0–77.0)
Platelets: 237 10*3/uL (ref 150.0–400.0)
RBC: 4.8 Mil/uL (ref 4.22–5.81)
RDW: 13.4 % (ref 11.5–15.5)
WBC: 7.7 10*3/uL (ref 4.0–10.5)

## 2014-10-18 LAB — HEMOGLOBIN A1C: HEMOGLOBIN A1C: 7.4 % — AB (ref 4.6–6.5)

## 2014-10-18 LAB — LIPID PANEL
Cholesterol: 150 mg/dL (ref 0–200)
HDL: 23.7 mg/dL — AB (ref >39.00)
NonHDL: 126.3
Total CHOL/HDL Ratio: 6
Triglycerides: 212 mg/dL — ABNORMAL HIGH (ref 0.0–149.0)
VLDL: 42.4 mg/dL — AB (ref 0.0–40.0)

## 2014-10-18 LAB — HEPATIC FUNCTION PANEL
ALT: 51 U/L (ref 0–53)
AST: 32 U/L (ref 0–37)
Albumin: 4.3 g/dL (ref 3.5–5.2)
Alkaline Phosphatase: 66 U/L (ref 39–117)
BILIRUBIN DIRECT: 0.2 mg/dL (ref 0.0–0.3)
Total Bilirubin: 0.8 mg/dL (ref 0.2–1.2)
Total Protein: 6.6 g/dL (ref 6.0–8.3)

## 2014-10-18 LAB — BASIC METABOLIC PANEL
BUN: 14 mg/dL (ref 6–23)
CHLORIDE: 104 meq/L (ref 96–112)
CO2: 27 mEq/L (ref 19–32)
CREATININE: 0.7 mg/dL (ref 0.4–1.5)
Calcium: 9.7 mg/dL (ref 8.4–10.5)
GFR: 117.7 mL/min (ref >60.00)
Glucose, Bld: 168 mg/dL — ABNORMAL HIGH (ref 70–99)
POTASSIUM: 4.3 meq/L (ref 3.5–5.1)
Sodium: 141 mEq/L (ref 135–145)

## 2014-10-18 LAB — TSH: TSH: 3.54 u[IU]/mL (ref 0.35–4.50)

## 2014-10-18 LAB — LDL CHOLESTEROL, DIRECT: Direct LDL: 94.1 mg/dL

## 2014-10-18 LAB — PSA: PSA: 0.97 ng/mL (ref 0.10–4.00)

## 2014-10-29 ENCOUNTER — Ambulatory Visit (INDEPENDENT_AMBULATORY_CARE_PROVIDER_SITE_OTHER): Payer: BLUE CROSS/BLUE SHIELD | Admitting: Family Medicine

## 2014-10-29 ENCOUNTER — Encounter: Payer: Self-pay | Admitting: Family Medicine

## 2014-10-29 VITALS — Temp 98.2°F | Ht 72.5 in | Wt 242.0 lb

## 2014-10-29 DIAGNOSIS — E780 Pure hypercholesterolemia: Secondary | ICD-10-CM

## 2014-10-29 DIAGNOSIS — K2100 Gastro-esophageal reflux disease with esophagitis, without bleeding: Secondary | ICD-10-CM

## 2014-10-29 DIAGNOSIS — IMO0002 Reserved for concepts with insufficient information to code with codable children: Secondary | ICD-10-CM

## 2014-10-29 DIAGNOSIS — E7801 Familial hypercholesterolemia: Secondary | ICD-10-CM

## 2014-10-29 DIAGNOSIS — E78019 Familial hypercholesterolemia, unspecified: Secondary | ICD-10-CM

## 2014-10-29 DIAGNOSIS — G4700 Insomnia, unspecified: Secondary | ICD-10-CM

## 2014-10-29 DIAGNOSIS — O10019 Pre-existing essential hypertension complicating pregnancy, unspecified trimester: Secondary | ICD-10-CM

## 2014-10-29 DIAGNOSIS — E785 Hyperlipidemia, unspecified: Secondary | ICD-10-CM

## 2014-10-29 DIAGNOSIS — E1165 Type 2 diabetes mellitus with hyperglycemia: Secondary | ICD-10-CM

## 2014-10-29 DIAGNOSIS — I10 Essential (primary) hypertension: Secondary | ICD-10-CM

## 2014-10-29 MED ORDER — ATORVASTATIN CALCIUM 20 MG PO TABS: ORAL_TABLET | ORAL | Status: DC

## 2014-10-29 MED ORDER — GLIPIZIDE 5 MG PO TABS: ORAL_TABLET | ORAL | Status: DC

## 2014-10-29 MED ORDER — ZOLPIDEM TARTRATE 5 MG PO TABS: ORAL_TABLET | ORAL | Status: DC

## 2014-10-29 MED ORDER — LOSARTAN POTASSIUM 100 MG PO TABS: ORAL_TABLET | ORAL | Status: DC

## 2014-10-29 MED ORDER — AMLODIPINE BESYLATE 10 MG PO TABS: 10.0000 mg | ORAL_TABLET | Freq: Every day | ORAL | Status: DC

## 2014-10-29 MED ORDER — METFORMIN HCL 1000 MG PO TABS: ORAL_TABLET | ORAL | Status: DC

## 2014-10-29 NOTE — Progress Notes (Signed)
Subjective:    Patient ID: Derrick Brown, male    DOB: 1951/01/22, 64 y.o.   MRN: [id number]  HPI Derrick Brown is a 64 year old married male nonsmoker who comes in today for general physical examination because of a history of diabetes hypertension hyperlipidemia and slightly overweight  Classic metabolic syndrome  On Norvasc 10 mg, Cozaar 100 mg his blood pressures 120/80  He takes Glucotrol 5 mg in the morning along with metformin 1000 mg twice a day blood sugars are 150 range A1c of 7.4%. He's walking 3 days weekly. Recommend he walk daily  He takes Lipitor 20 mg a day and an aspirin tablet for hyperlipidemia  He also takes proton X 40 mg daily for hyperlipidemia, Ambien 3 times weekly for sleep dysfunction. He uses [facility] from his dermatologist.  He gets routine eye care, dental care, recent colonoscopy normal, vaccinations updated by Derrick Brown  Cognitive function normal he does not exercise daily home health safety reviewed no issues identified, no guns in the house, he does have a healthcare power of attorney and living well   Review of Systems  Constitutional: Negative.   HENT: Negative.   Eyes: Negative.   Respiratory: Negative.   Cardiovascular: Negative.   Gastrointestinal: Negative.   Endocrine: Negative.   Genitourinary: Negative.   Musculoskeletal: Negative.   Skin: Negative.   Allergic/Immunologic: Negative.   Neurological: Negative.   Hematological: Negative.   Psychiatric/Behavioral: Negative.        Objective:   Physical Exam  Constitutional: He is oriented to person, place, and time. He appears well-developed and well-nourished.  HENT:  Head: Normocephalic and atraumatic.  Right Ear: External ear normal.  Left Ear: External ear normal.  Nose: Nose normal.  Mouth/Throat: Oropharynx is clear and moist.  Eyes: Conjunctivae and EOM are normal. Pupils are equal, round, and reactive to light.  Neck: Normal range of motion. Neck supple. No JVD present. No  tracheal deviation present. No thyromegaly present.  Cardiovascular: Normal rate, regular rhythm, normal heart sounds and intact distal pulses.  Exam reveals no gallop and no friction rub.   No murmur heard. No carotid nor aortic bruits peripheral pulses 2+ and symmetrical  Pulmonary/Chest: Effort normal and breath sounds normal. No stridor. No respiratory distress. He has no wheezes. He has no rales. He exhibits no tenderness.  Abdominal: Soft. Bowel sounds are normal. He exhibits no distension and no mass. There is no tenderness. There is no rebound and no guarding.  Genitourinary: Rectum normal and penis normal. Guaiac negative stool. No penile tenderness.  1+ symmetrical nonnodular BPH....... nocturia 1  Musculoskeletal: Normal range of motion. He exhibits no edema or tenderness.  Lymphadenopathy:    He has no cervical adenopathy.  Neurological: He is alert and oriented to person, place, and time. He has normal reflexes. No cranial nerve deficit. He exhibits normal muscle tone.  Skin: Skin is warm and dry. No rash noted. No erythema. No pallor.  Light skin and light eyes total body skin exam normal except for an enlarging lesion of his left supraclavicular area it's a seborrheic keratosis over his right increasing in size and rubbing he may want removed  Psychiatric: He has a normal mood and affect. His behavior is normal. Judgment and thought content normal.  Nursing note and vitals reviewed.         Assessment & Plan:  Healthy male  Hypertension ago continue current therapy  Hyperlipidemia goal continue current therapy  Diabetes type 2 not at goal walk  daily continue current medicine A1c in 3 months  Reflux esophagitis continue proton X  Sleep dysfunction Ambien when necessary  Rosacea followed by dermatology

## 2014-10-29 NOTE — Progress Notes (Signed)
Pre visit review using our clinic review tool, if applicable. No additional management support is needed unless otherwise documented below in the visit note. Lab Results  Component Value Date   HGBA1C 7.4* 10/18/2014   HGBA1C 7.4* 07/20/2014   HGBA1C 7.7* 04/23/2014   Lab Results  Component Value Date   MICROALBUR 2.6* 10/18/2014   LDLCALC 75 09/08/2013   CREATININE 0.7 10/18/2014

## 2014-10-29 NOTE — Patient Instructions (Signed)
Continue your current medications  Walk 30 minutes daily  Fasting blood sugar 1 Monday Wednesday Friday  Follow-up in 3 months  A1c one week prior nonfasting.......Marland Kitchen 11 AM............Marland Kitchen or 4 PM.

## 2014-10-30 MED ORDER — PANTOPRAZOLE SODIUM 40 MG PO TBEC: DELAYED_RELEASE_TABLET | ORAL | Status: DC

## 2014-11-11 ENCOUNTER — Encounter: Payer: Self-pay | Admitting: Family Medicine

## 2014-11-13 ENCOUNTER — Encounter: Payer: Self-pay | Admitting: Family Medicine

## 2014-11-13 ENCOUNTER — Ambulatory Visit (INDEPENDENT_AMBULATORY_CARE_PROVIDER_SITE_OTHER): Payer: BLUE CROSS/BLUE SHIELD | Admitting: Family Medicine

## 2014-11-13 VITALS — BP 124/78 | Temp 98.5°F | Wt 242.0 lb

## 2014-11-13 DIAGNOSIS — B029 Zoster without complications: Secondary | ICD-10-CM

## 2014-11-13 MED ORDER — VALACYCLOVIR HCL 1 G PO TABS: ORAL_TABLET | ORAL | Status: DC

## 2014-11-13 NOTE — Patient Instructions (Signed)
Valtrex 1000 mg,,,,,,, 1 tablet 3 times daily for 10 days refills 2,

## 2014-11-13 NOTE — Progress Notes (Signed)
Pre visit review using our clinic review tool, if applicable. No additional management support is needed unless otherwise documented below in the visit note. 

## 2014-11-13 NOTE — Progress Notes (Signed)
   Subjective:    Patient ID: Derrick Brown, male    DOB: 07-Apr-1951, 64 y.o.   MRN: [id number]  HPI Derrick Brown is a 64 year old married male nonsmoker who comes in today for evaluation of his skin rash  He had a shingles vaccine about a year ago  About 5 days ago he noticed some discomfort itching type sensation in his right scapula. He then developed a red rash. Does not cross the midline   Review of Systems Review of systems negative    Objective:   Physical Exam Well-developed well-nourished male no acute distress examination skin shows a classic small vesicular lesions consistent with chickenpox reactivation       Assessment & Plan:  Shingles,,,,,,,,,,,,, treat with acyclovir,

## 2014-12-18 ENCOUNTER — Ambulatory Visit (INDEPENDENT_AMBULATORY_CARE_PROVIDER_SITE_OTHER): Payer: BLUE CROSS/BLUE SHIELD

## 2014-12-18 VITALS — BP 167/95 | HR 89 | Resp 13 | Ht 74.0 in | Wt 240.0 lb

## 2014-12-18 DIAGNOSIS — L600 Ingrowing nail: Secondary | ICD-10-CM

## 2014-12-18 DIAGNOSIS — B351 Tinea unguium: Secondary | ICD-10-CM | POA: Diagnosis not present

## 2014-12-18 DIAGNOSIS — M79675 Pain in left toe(s): Secondary | ICD-10-CM | POA: Diagnosis not present

## 2014-12-18 MED ORDER — NEOMYCIN-POLYMYXIN-HC 3.5-10000-1 OT SOLN
OTIC | Status: DC
Start: 2014-12-18 — End: 2016-08-04

## 2014-12-18 NOTE — Progress Notes (Signed)
   Subjective:    Patient ID: Derrick Brown, male    DOB: [birth year], 64 y.o.   MRN: [id number]  HPI Comments: Pt complains of painful ingrown left 4th toenail, with history of removal by DR. Petrinitz.     Review of Systems  All other systems reviewed and are negative.      Objective:   Physical Exam Neurovascular status is intact pedal pulses are palpable DP and PT +2 over 4 Refill time 3 seconds all digits epicritic and proprioceptive sensations intact and symmetric bilateral normal plantar response and DTRs. Neurologically skin color pigment normal hair growth absent nail somewhat criptotic incurvated fourth toe left foot.'s had previous nail excision with recurrence there is thickening yellowing discoloration brittleness of the nail. Does have history of diabetes without significant complications intact epicritic and proprioceptive sensations noted normal plantar response. DTRs not elicited. Semirigid digital contractures are noted the lesser digits no open wounds no ulcers no secondary infections however there is incurvation tenderness and palpation palpation fourth toe left foot with dystrophy of the nail plate       Assessment & Plan:  Assessment this time diabetes without significant complication. Patient does have criptotic incurvated for mycotic nail fourth toe left foot recommended permanent nail excision with phenol matricectomy. This time local anesthetic block Mr. total of 3 mL 50-50 mixture Xylocaine and Marcaine plain infiltrated to the fourth toe Betadine prep was performed. Nail plate is avulsed followed by phenol matricectomy alcohol wash Betadine ointment and a dry sterile dressing applied to the fourth toe left foot will initiate daily soaks apply Cortisporin for antibiotic and a Band-Aid dressing daily also recommended Tylenol or Advil as needed for pain. Recheck in 2 weeks for follow-up and nail check. This immediately if there is a changes or exacerbations in symptoms at  any time  Harriet Masson DPM

## 2014-12-18 NOTE — Patient Instructions (Signed)
Betadine Soak Instructions  Purchase an 8 oz. bottle of BETADINE solution (Povidone)  THE DAY AFTER THE PROCEDURE  Place 1 tablespoon of betadine solution in a quart of warm tap water.  Submerge your foot or feet with outer bandage intact for the initial soak; this will allow the bandage to become moist and wet for easy lift off.  Once you remove your bandage, continue to soak in the solution for 20 minutes.  This soak should be done twice a day.  Next, remove your foot or feet from solution, blot dry the affected area and cover.  You may use a band aid large enough to cover the area or use gauze and tape.  Apply other medications to the area as directed by the doctor such as cortisporin otic solution (ear drops) or neosporin.  IF YOUR SKIN BECOMES IRRITATED WHILE USING THESE INSTRUCTIONS, IT IS OKAY TO SWITCH TO EPSOM SALTS AND WATER OR WHITE VINEGAR AND WATER.   As far as pain goes recommended Tylenol or Advil as needed for pain. Suggest to Tylenol or 2 Advil 3 times daily for the first day or 2 if needed

## 2015-01-01 ENCOUNTER — Ambulatory Visit (INDEPENDENT_AMBULATORY_CARE_PROVIDER_SITE_OTHER): Payer: BLUE CROSS/BLUE SHIELD

## 2015-01-01 VITALS — BP 140/91 | HR 81 | Resp 18

## 2015-01-01 DIAGNOSIS — B351 Tinea unguium: Secondary | ICD-10-CM

## 2015-01-01 DIAGNOSIS — M79675 Pain in left toe(s): Secondary | ICD-10-CM

## 2015-01-01 DIAGNOSIS — L600 Ingrowing nail: Secondary | ICD-10-CM

## 2015-01-01 DIAGNOSIS — Z09 Encounter for follow-up examination after completed treatment for conditions other than malignant neoplasm: Secondary | ICD-10-CM

## 2015-01-01 NOTE — Patient Instructions (Signed)

## 2015-01-01 NOTE — Progress Notes (Signed)
   Subjective:    Patient ID: Derrick Brown, male    DOB: 11/25/1950, 64 y.o.   MRN: [id number]  HPI MY 4TH TOE ON MY LEFT FOOT IS STILL SORE AND TENDER BUT OVERALL GOOD    Review of Systems no new findings or systemic changes noted     Objective:   Physical Exam Neurovascular status is intact no new findings patient is two-week status post excision fourth toenail left foot there is some good pink granular base mild serous drainage still present he's been doing the drops and Band-Aid daily and soaking as instructed. Minimal pain or discomfort noted in flexion no ascending cellulitis       Assessment & Plan:  Assessment good postop progress following AP nail procedure fourth left patient will continue with Neosporin and Band-Aid or Cortisporin and Band-Aid daily we've the Band-Aid off at night with the lip breathing air dry. He basis for any future follow-up should resolve the next 24 weeks completely  Harriet Masson DPM

## 2015-01-25 ENCOUNTER — Other Ambulatory Visit (INDEPENDENT_AMBULATORY_CARE_PROVIDER_SITE_OTHER): Payer: BLUE CROSS/BLUE SHIELD

## 2015-01-25 DIAGNOSIS — IMO0002 Reserved for concepts with insufficient information to code with codable children: Secondary | ICD-10-CM

## 2015-01-25 DIAGNOSIS — E1165 Type 2 diabetes mellitus with hyperglycemia: Secondary | ICD-10-CM | POA: Diagnosis not present

## 2015-01-25 LAB — HEMOGLOBIN A1C: HEMOGLOBIN A1C: 7.4 % — AB (ref 4.6–6.5)

## 2015-01-25 LAB — BASIC METABOLIC PANEL
BUN: 11 mg/dL (ref 6–23)
CHLORIDE: 103 meq/L (ref 96–112)
CO2: 25 meq/L (ref 19–32)
CREATININE: 0.65 mg/dL (ref 0.40–1.50)
Calcium: 9.4 mg/dL (ref 8.4–10.5)
GFR: 132.33 mL/min (ref >60.00)
GLUCOSE: 184 mg/dL — AB (ref 70–99)
Potassium: 3.5 mEq/L (ref 3.5–5.1)
Sodium: 139 mEq/L (ref 135–145)

## 2015-02-04 ENCOUNTER — Ambulatory Visit (INDEPENDENT_AMBULATORY_CARE_PROVIDER_SITE_OTHER): Payer: BLUE CROSS/BLUE SHIELD | Admitting: Family Medicine

## 2015-02-04 VITALS — BP 130/80 | Temp 98.1°F | Wt 243.0 lb

## 2015-02-04 DIAGNOSIS — E1165 Type 2 diabetes mellitus with hyperglycemia: Secondary | ICD-10-CM | POA: Diagnosis not present

## 2015-02-04 DIAGNOSIS — IMO0002 Reserved for concepts with insufficient information to code with codable children: Secondary | ICD-10-CM

## 2015-02-04 MED ORDER — GLIPIZIDE 5 MG PO TABS: ORAL_TABLET | ORAL | Status: DC

## 2015-02-04 NOTE — Patient Instructions (Signed)
Increased his glipizide to 1 pill twice daily  Keep the metformin the same one twice daily  Walk 30 minutes daily  Check a fasting blood sugar Monday Wednesday Friday  Return in 3 months for an A1c.......Marland Kitchen Rachel we'll call you the report....Marland KitchenMarland Kitchen her extension is [phone]

## 2015-02-04 NOTE — Progress Notes (Signed)
   Subjective:    Patient ID: Ivor Costa, male    DOB: 1950/11/26, 64 y.o.   MRN: [id number]  HPI Mr. Peake is a 64 year old married male nonsmoker who comes in today for follow-up of diabetes  He's taken metformin 1000 mg twice a day and Glucotrol 5 mg daily. He's walking 3-4 days a week. A1c 7.4% blood sugars at home 140-160 fasting. 186 here   Review of Systems Review of systems otherwise negative    Objective:   Physical Exam  Well-developed well-nourished male no acute distress vital signs stable he is afebrile BP today 130/80      Assessment & Plan:  Diabetes type 2 not at goal..... Increase glipizide to 5 mg twice a day........ walk daily........ continue metformin 1000 mg twice a day........ follow-up A1c in 3 months

## 2015-02-04 NOTE — Progress Notes (Signed)
Pre visit review using our clinic review tool, if applicable. No additional management support is needed unless otherwise documented below in the visit note. 

## 2015-05-02 ENCOUNTER — Encounter: Payer: Self-pay | Admitting: Family Medicine

## 2015-05-02 ENCOUNTER — Other Ambulatory Visit: Payer: Self-pay

## 2015-05-03 MED ORDER — HYDROCHLOROTHIAZIDE 12.5 MG PO TABS: 12.5000 mg | ORAL_TABLET | Freq: Every day | ORAL | Status: DC | PRN

## 2015-05-07 ENCOUNTER — Other Ambulatory Visit (INDEPENDENT_AMBULATORY_CARE_PROVIDER_SITE_OTHER): Payer: BLUE CROSS/BLUE SHIELD

## 2015-05-07 DIAGNOSIS — I10 Essential (primary) hypertension: Secondary | ICD-10-CM

## 2015-05-07 DIAGNOSIS — E119 Type 2 diabetes mellitus without complications: Secondary | ICD-10-CM | POA: Diagnosis not present

## 2015-05-07 LAB — BASIC METABOLIC PANEL
BUN: 11 mg/dL (ref 6–23)
CHLORIDE: 101 meq/L (ref 96–112)
CO2: 27 mEq/L (ref 19–32)
Calcium: 9.4 mg/dL (ref 8.4–10.5)
Creatinine, Ser: 0.77 mg/dL (ref 0.40–1.50)
GFR: 108.73 mL/min (ref >60.00)
GLUCOSE: 178 mg/dL — AB (ref 70–99)
Potassium: 3.8 mEq/L (ref 3.5–5.1)
SODIUM: 139 meq/L (ref 135–145)

## 2015-05-07 LAB — HEMOGLOBIN A1C: HEMOGLOBIN A1C: 7.3 % — AB (ref 4.6–6.5)

## 2015-07-26 ENCOUNTER — Ambulatory Visit (INDEPENDENT_AMBULATORY_CARE_PROVIDER_SITE_OTHER): Payer: BLUE CROSS/BLUE SHIELD | Admitting: *Deleted

## 2015-07-26 DIAGNOSIS — Z23 Encounter for immunization: Secondary | ICD-10-CM

## 2015-09-16 ENCOUNTER — Other Ambulatory Visit: Payer: Self-pay | Admitting: Family Medicine

## 2015-11-25 ENCOUNTER — Other Ambulatory Visit: Payer: Self-pay | Admitting: Family Medicine

## 2015-12-18 ENCOUNTER — Other Ambulatory Visit: Payer: Self-pay | Admitting: Family Medicine

## 2016-01-20 DIAGNOSIS — H524 Presbyopia: Secondary | ICD-10-CM | POA: Diagnosis not present

## 2016-02-24 ENCOUNTER — Other Ambulatory Visit: Payer: Self-pay | Admitting: Family Medicine

## 2016-03-18 ENCOUNTER — Other Ambulatory Visit: Payer: BLUE CROSS/BLUE SHIELD

## 2016-03-25 ENCOUNTER — Encounter: Payer: BLUE CROSS/BLUE SHIELD | Admitting: Family Medicine

## 2016-03-30 ENCOUNTER — Encounter: Payer: Self-pay | Admitting: Family Medicine

## 2016-05-15 DIAGNOSIS — L738 Other specified follicular disorders: Secondary | ICD-10-CM | POA: Diagnosis not present

## 2016-05-15 DIAGNOSIS — D1801 Hemangioma of skin and subcutaneous tissue: Secondary | ICD-10-CM | POA: Diagnosis not present

## 2016-05-15 DIAGNOSIS — L7211 Pilar cyst: Secondary | ICD-10-CM | POA: Diagnosis not present

## 2016-05-15 DIAGNOSIS — B353 Tinea pedis: Secondary | ICD-10-CM | POA: Diagnosis not present

## 2016-05-15 DIAGNOSIS — D1809 Hemangioma of other sites: Secondary | ICD-10-CM | POA: Diagnosis not present

## 2016-05-29 DIAGNOSIS — Z4802 Encounter for removal of sutures: Secondary | ICD-10-CM | POA: Diagnosis not present

## 2016-06-04 ENCOUNTER — Encounter: Payer: Self-pay | Admitting: Family Medicine

## 2016-06-05 ENCOUNTER — Other Ambulatory Visit: Payer: Self-pay | Admitting: Family Medicine

## 2016-06-05 NOTE — Telephone Encounter (Signed)
Rx refill sent to pharmacy. 

## 2016-06-12 DIAGNOSIS — Z08 Encounter for follow-up examination after completed treatment for malignant neoplasm: Secondary | ICD-10-CM | POA: Diagnosis not present

## 2016-06-28 DIAGNOSIS — J00 Acute nasopharyngitis [common cold]: Secondary | ICD-10-CM | POA: Diagnosis not present

## 2016-07-14 ENCOUNTER — Telehealth: Payer: Self-pay | Admitting: Family Medicine

## 2016-07-16 NOTE — Telephone Encounter (Signed)
Pharmacy called for pt to follow up on request for refill of amLODipine (NORVASC) 10 MG tablet hydrochlorothiazide (HYDRODIURIL) 12.5 MG tablet   Pt has cpe on 10/17 with Dr Sherren Mocha

## 2016-07-20 NOTE — Telephone Encounter (Signed)
Prescriptions have been filled.

## 2016-07-28 ENCOUNTER — Other Ambulatory Visit (INDEPENDENT_AMBULATORY_CARE_PROVIDER_SITE_OTHER): Payer: BLUE CROSS/BLUE SHIELD

## 2016-07-28 ENCOUNTER — Ambulatory Visit (INDEPENDENT_AMBULATORY_CARE_PROVIDER_SITE_OTHER): Payer: BLUE CROSS/BLUE SHIELD | Admitting: Emergency Medicine

## 2016-07-28 DIAGNOSIS — Z0000 Encounter for general adult medical examination without abnormal findings: Secondary | ICD-10-CM

## 2016-07-28 DIAGNOSIS — R7989 Other specified abnormal findings of blood chemistry: Secondary | ICD-10-CM | POA: Diagnosis not present

## 2016-07-28 DIAGNOSIS — Z1159 Encounter for screening for other viral diseases: Secondary | ICD-10-CM

## 2016-07-28 DIAGNOSIS — Z23 Encounter for immunization: Secondary | ICD-10-CM | POA: Diagnosis not present

## 2016-07-28 LAB — CBC WITH DIFFERENTIAL/PLATELET
BASOS PCT: 0.8 % (ref 0.0–3.0)
Basophils Absolute: 0.1 10*3/uL (ref 0.0–0.1)
EOS PCT: 6 % — AB (ref 0.0–5.0)
Eosinophils Absolute: 0.5 10*3/uL (ref 0.0–0.7)
HEMATOCRIT: 43.8 % (ref 39.0–52.0)
HEMOGLOBIN: 15 g/dL (ref 13.0–17.0)
Lymphocytes Relative: 28.1 % (ref 12.0–46.0)
Lymphs Abs: 2.1 10*3/uL (ref 0.7–4.0)
MCHC: 34.3 g/dL (ref 30.0–36.0)
MCV: 89.6 fl (ref 78.0–100.0)
MONOS PCT: 8.7 % (ref 3.0–12.0)
Monocytes Absolute: 0.7 10*3/uL (ref 0.1–1.0)
Neutro Abs: 4.3 10*3/uL (ref 1.4–7.7)
Neutrophils Relative %: 56.4 % (ref 43.0–77.0)
Platelets: 251 10*3/uL (ref 150.0–400.0)
RBC: 4.89 Mil/uL (ref 4.22–5.81)
RDW: 13.5 % (ref 11.5–15.5)
WBC: 7.6 10*3/uL (ref 4.0–10.5)

## 2016-07-28 LAB — LIPID PANEL
CHOLESTEROL: 138 mg/dL (ref 0–200)
HDL: 20.4 mg/dL — ABNORMAL LOW (ref >39.00)
NONHDL: 118.04
TRIGLYCERIDES: 286 mg/dL — AB (ref 0.0–149.0)
Total CHOL/HDL Ratio: 7
VLDL: 57.2 mg/dL — ABNORMAL HIGH (ref 0.0–40.0)

## 2016-07-28 LAB — BASIC METABOLIC PANEL
BUN: 11 mg/dL (ref 6–23)
CO2: 27 mEq/L (ref 19–32)
Calcium: 9.5 mg/dL (ref 8.4–10.5)
Chloride: 101 mEq/L (ref 96–112)
Creatinine, Ser: 0.68 mg/dL (ref 0.40–1.50)
GFR: 125 mL/min (ref >60.00)
GLUCOSE: 227 mg/dL — AB (ref 70–99)
POTASSIUM: 3.9 meq/L (ref 3.5–5.1)
SODIUM: 139 meq/L (ref 135–145)

## 2016-07-28 LAB — HEPATIC FUNCTION PANEL
ALBUMIN: 4.1 g/dL (ref 3.5–5.2)
ALT: 46 U/L (ref 0–53)
AST: 27 U/L (ref 0–37)
Alkaline Phosphatase: 64 U/L (ref 39–117)
Bilirubin, Direct: 0.2 mg/dL (ref 0.0–0.3)
TOTAL PROTEIN: 6.2 g/dL (ref 6.0–8.3)
Total Bilirubin: 0.7 mg/dL (ref 0.2–1.2)

## 2016-07-28 LAB — POC URINALSYSI DIPSTICK (AUTOMATED)
Bilirubin, UA: NEGATIVE
GLUCOSE UA: NEGATIVE
LEUKOCYTES UA: NEGATIVE
NITRITE UA: NEGATIVE
RBC UA: NEGATIVE
Spec Grav, UA: 1.025
UROBILINOGEN UA: 0.2
pH, UA: 5.5

## 2016-07-28 LAB — TSH: TSH: 3.39 u[IU]/mL (ref 0.35–4.50)

## 2016-07-28 LAB — MICROALBUMIN / CREATININE URINE RATIO
Creatinine,U: 248.3 mg/dL
MICROALB UR: 3.7 mg/dL — AB (ref 0.0–1.9)
Microalb Creat Ratio: 1.5 mg/g (ref 0.0–30.0)

## 2016-07-28 LAB — HEMOGLOBIN A1C: Hgb A1c MFr Bld: 8.6 % — ABNORMAL HIGH (ref 4.6–6.5)

## 2016-07-28 LAB — LDL CHOLESTEROL, DIRECT: Direct LDL: 75 mg/dL

## 2016-07-28 LAB — PSA: PSA: 1.01 ng/mL (ref 0.10–4.00)

## 2016-07-29 LAB — HEPATITIS C ANTIBODY: HCV AB: NEGATIVE

## 2016-08-03 ENCOUNTER — Telehealth: Payer: Self-pay | Admitting: *Deleted

## 2016-08-03 NOTE — Telephone Encounter (Signed)
-----   Message from Dorena Cookey, MD sent at 08/03/2016 10:54 AM EDT ----- Please call blood sugar markedly elevated. Recommend he see an endocrinologist Dr. Lorie Apley ASAP. Please set him up with an appointment

## 2016-08-04 ENCOUNTER — Other Ambulatory Visit: Payer: Self-pay | Admitting: Emergency Medicine

## 2016-08-04 ENCOUNTER — Encounter: Payer: Self-pay | Admitting: Family Medicine

## 2016-08-04 ENCOUNTER — Ambulatory Visit (INDEPENDENT_AMBULATORY_CARE_PROVIDER_SITE_OTHER): Payer: BLUE CROSS/BLUE SHIELD | Admitting: Family Medicine

## 2016-08-04 VITALS — BP 136/78 | HR 79 | Temp 97.9°F | Ht 73.0 in | Wt 241.6 lb

## 2016-08-04 DIAGNOSIS — Z23 Encounter for immunization: Secondary | ICD-10-CM

## 2016-08-04 DIAGNOSIS — F5101 Primary insomnia: Secondary | ICD-10-CM

## 2016-08-04 DIAGNOSIS — E785 Hyperlipidemia, unspecified: Secondary | ICD-10-CM | POA: Diagnosis not present

## 2016-08-04 DIAGNOSIS — R739 Hyperglycemia, unspecified: Secondary | ICD-10-CM

## 2016-08-04 DIAGNOSIS — Z0000 Encounter for general adult medical examination without abnormal findings: Secondary | ICD-10-CM | POA: Diagnosis not present

## 2016-08-04 DIAGNOSIS — E7801 Familial hypercholesterolemia: Secondary | ICD-10-CM | POA: Diagnosis not present

## 2016-08-04 MED ORDER — METFORMIN HCL 1000 MG PO TABS: ORAL_TABLET | ORAL | 3 refills | Status: DC

## 2016-08-04 MED ORDER — LOSARTAN POTASSIUM 100 MG PO TABS: 100.0000 mg | ORAL_TABLET | Freq: Every day | ORAL | 3 refills | Status: DC

## 2016-08-04 MED ORDER — SILDENAFIL CITRATE 20 MG PO TABS: 20.0000 mg | ORAL_TABLET | Freq: Every evening | ORAL | 11 refills | Status: DC | PRN

## 2016-08-04 MED ORDER — METRONIDAZOLE 0.75 % EX GEL: CUTANEOUS | 11 refills | Status: AC

## 2016-08-04 MED ORDER — HYDROCHLOROTHIAZIDE 12.5 MG PO TABS: 12.5000 mg | ORAL_TABLET | Freq: Every day | ORAL | 3 refills | Status: DC | PRN

## 2016-08-04 MED ORDER — AMLODIPINE BESYLATE 10 MG PO TABS: ORAL_TABLET | ORAL | 3 refills | Status: DC

## 2016-08-04 MED ORDER — PANTOPRAZOLE SODIUM 40 MG PO TBEC
40.0000 mg | DELAYED_RELEASE_TABLET | Freq: Every day | ORAL | 3 refills | Status: DC
Start: 2016-08-04 — End: 2017-06-30

## 2016-08-04 MED ORDER — GLIPIZIDE 5 MG PO TABS: ORAL_TABLET | ORAL | 3 refills | Status: DC

## 2016-08-04 MED ORDER — ZOLPIDEM TARTRATE 5 MG PO TABS: ORAL_TABLET | ORAL | 3 refills | Status: DC

## 2016-08-04 MED ORDER — ATORVASTATIN CALCIUM 20 MG PO TABS: ORAL_TABLET | ORAL | 3 refills | Status: DC

## 2016-08-04 NOTE — Telephone Encounter (Signed)
Pt seen in office today and made aware that referral has been placed to endo. Informed pt that he should receive a call to schedule appointment. Pt verbalized understanding.

## 2016-08-04 NOTE — Patient Instructions (Signed)
Eliminate all the high fructose corn syrup......... Cheerios............ try oatmeal as we discussed for breakfast  Walk 30 minutes daily  We will set you up a consult with Dr. Lorie Apley ASAP

## 2016-08-04 NOTE — Progress Notes (Signed)
Pre visit review using our clinic review tool, if applicable. No additional management support is needed unless otherwise documented below in the visit note. 

## 2016-08-04 NOTE — Progress Notes (Signed)
Derrick Brown is a 65 year old married male nonsmoker who comes in today for general physical examination because the metabolic syndrome.....Marland Kitchen obesity diabetes hypertension hyperlipidemia. He also has rosacea, chronic reflux esophagitis, occasional HSV-1, and chronic sleep disorder.  On Glucotrol 5 mg twice a day of metformin 1000 mg twice a day blood sugar in the past is been fairly normal now it's up to 227 A1c is 8.6% which is markedly elevated. He also has some proteinuria. We'll get a consult from Dr. Lorie Apley  In reviewing diet he eats a lot of cereal which has high fructose corn syrup in it. [place] to eliminate that. He is also not walking daily. Asked him start a walking program 20 minutes daily  His blood pressure is 136/78 on his Norvasc 10 mg, Hydrocort thiazide 12.5 mg, Cozaar 100 mg.  He uses MetroGel for rosacea  He also takes protonic 40 mg daily for chronic reflux. He doesn't take any has symptoms of chest pain and reflux asked him to try to take it Monday Wednesday Friday  He takes Ambien 5 mg one half tab daily at bedtime for chronic sleep dysfunction.  Social history he works in [employer].  He also takes Lipitor 20 and an aspirin tablet for hyperlipidemia LDL 75  He gets routine eye care, dental care, colonoscopy 2014 showed a polyp is due to go back in 2019.  Vaccinations seasonal flu shot October 2017 tetanus booster given today  He had an ophthalmologic eye exam by Dr. Modena Morrow of March 2017 which is normal.  Diabetic foot exam in the past is been normal. No numbness or tingling.  Weight is 243 pounds in April now 241.  14 point review of systems reviewed are negative except for above  Vital signs stable he is afebrile HEENT were negative neck was supple no adenopathy thyroid normal no carotid bruits cardiopulmonary exam normal abdominal exam normal genitalia normal circumcised male rectum normal. Guaiac negative prostate 1+ symmetrical nonnodular BPH  extremities normal skin normal peripheral pulses normal no neuropathy.  #1 diabetes type 2 not at goal....... stressed diet exercise and weight loss consult with Dr. Lorie Apley  #2 hypertension at goal.....Marland Kitchen continue current therapy  #3 hyperlipidemia goal....... continue current therapy  #4 obesity........ again stressed diet exercise and weight loss  #5 rosacea......... continue MetroGel  #6 sleep dysfunction....... continue Ambien 2.5 mg daily at bedtime  #7 occasional HSV-1......Marland Kitchen renew Valtrex.Marland Kitchen

## 2016-08-10 ENCOUNTER — Encounter: Payer: Self-pay | Admitting: Family Medicine

## 2016-08-12 ENCOUNTER — Encounter: Payer: Self-pay | Admitting: Family Medicine

## 2016-08-12 ENCOUNTER — Telehealth: Payer: Self-pay | Admitting: Emergency Medicine

## 2016-08-12 NOTE — Telephone Encounter (Signed)
See telephone encounter dated 08/12/16.

## 2016-08-12 NOTE — Telephone Encounter (Signed)
Called and spoke with pt informing pt that referral has been placed and it normally takes 7 to 10 business days. After reviewing patients chart appointment has been made with Dr. Cruzita Lederer for Dec 4th @ 8:15. Pt did not receive Pneumococcal vaccine because he received prevnar 13 in 2015 and per Doctor advice he should receive the pneumovax 23 at the age of 60 which patient was made aware at appointment. Pt verbalized understanding and was just inquiring as to why his my chart is saying he has overdue health maintenance. Nothing further needed at this time.

## 2016-09-21 ENCOUNTER — Encounter: Payer: Self-pay | Admitting: Internal Medicine

## 2016-09-21 ENCOUNTER — Ambulatory Visit (INDEPENDENT_AMBULATORY_CARE_PROVIDER_SITE_OTHER): Payer: BLUE CROSS/BLUE SHIELD | Admitting: Internal Medicine

## 2016-09-21 VITALS — BP 145/79 | HR 84 | Ht 73.0 in | Wt 245.0 lb

## 2016-09-21 DIAGNOSIS — E1165 Type 2 diabetes mellitus with hyperglycemia: Secondary | ICD-10-CM

## 2016-09-21 MED ORDER — SITAGLIPTIN PHOSPHATE 100 MG PO TABS: 100.0000 mg | ORAL_TABLET | Freq: Every day | ORAL | 5 refills | Status: DC

## 2016-09-21 NOTE — Progress Notes (Signed)
Patient ID: Derrick Brown, male   DOB: [birth year], 65 y.o.   MRN: [id number]   HPI: Derrick Brown is a 65 y.o.-year-old male, referred by his PCP, Dr. Sherren Mocha, for management of DM2, dx in ~2007, non-insulin-dependent, uncontrolled, without complications.  Last hemoglobin A1c was: Lab Results  Component Value Date   HGBA1C 8.6 (H) 07/28/2016   HGBA1C 7.3 (H) 05/07/2015   HGBA1C 7.4 (H) 01/25/2015   Pt is on a regimen of: - Metformin 1000 mg 2x a day, with meals - Glipizide 5 mg 2x a day, before meals  Pt checks his sugars 0-1x a day and they are: - am: n/c >> 120-140 in the past; now 140-180 - 2h after b'fast: n/c - before lunch: n/c - 2h after lunch: n/c - before dinner: n/c - 2h after dinner: n/c - bedtime: n/c - nighttime: n/c No lows. Lowest sugar was 120. Highest sugar was 220.  Glucometer: Freestyle Lite  Pt's meals are: - Breakfast: oatmeal + blueberries + pecan; cereals; fruit juice; coffee - Lunch: chicken salad or sandwich - late - Dinner: cereal or light dinner - late - Snacks: takes snack boxes to work: yoghurt, veggies, grapes/prunes, chips/cheetos Eating many cereal bowls.  No exercise. Stays active at work.  - no CKD, last BUN/creatinine:  Lab Results  Component Value Date   BUN 11 07/28/2016   BUN 11 05/07/2015   CREATININE 0.68 07/28/2016   CREATININE 0.77 05/07/2015  On Losartan. - last set of lipids: Lab Results  Component Value Date   CHOL 138 07/28/2016   HDL 20.40 (L) 07/28/2016   LDLCALC 75 09/08/2013   LDLDIRECT 75.0 07/28/2016   TRIG 286.0 (H) 07/28/2016   CHOLHDL 7 07/28/2016  On Lipitor. - last eye exam was: 01/13/2016: DR. Gershon Crane. No DR.  - no numbness and tingling in his feet.  Pt has FH of DM in PGM, P uncle.Marland Kitchen He has a FH of cardiac ds in father.  He also has HL, HTN, GERD.  ROS: Constitutional: no weight gain/loss, + fatigue, + subjective hyperthermia, + poor sleep, + excessive urination, + nocturia Eyes: no blurry vision, no  xerophthalmia ENT: no sore throat, no nodules palpated in throat, no dysphagia/odynophagia, no hoarseness, + hypoacusis, + tinnitis Cardiovascular: no CP/+ SOB/no palpitations/+ leg swelling Respiratory: no cough/+ SOB Gastrointestinal: no N/V/D/C/+ heartburn Musculoskeletal: no muscle/+ joint aches Skin: no rashes Neurological: no tremors/numbness/tingling/dizziness Psychiatric: no depression/anxiety + low libido.  Past Medical History:  Diagnosis Date  . ACNE ROSACEA 02/21/2009  . COUGH DUE TO ACE INHIBITORS 04/12/2009  . Diabetes mellitus without complication ([place])    type 2  . DYSPHAGIA 05/15/2009  . ESOPHAGITIS, REFLUX 05/13/2009  . FATIGUE 03/14/2010  . Fatty liver   . GERD (gastroesophageal reflux disease)   . HYPERGLYCEMIA 02/21/2009  . HYPERLIPIDEMIA 02/21/2009  . HYPERTENSION NEC 02/21/2009  . INSOMNIA 04/02/2010  . MUSCLE PAIN 05/13/2009  . OBSTRUCTIVE SLEEP APNEA 04/02/2010  . RENAL CALCULUS 02/21/2009  . TEMPOROMANDIBULAR JOINT DISORDER 06/10/2009  . Unspecified hypertrophic and atrophic condition of skin    Past Surgical History:  Procedure Laterality Date  . COLONOSCOPY    . ESOPHAGOGASTRODUODENOSCOPY    . KIDNEY STONE SURGERY     removal   Social History   Social History  . Marital status: Married    Spouse name: N/A  . Number of children:    Occupational History  .    Social History Main Topics  . Smoking status: Never Smoker  . Smokeless  tobacco: Never Used  . Alcohol use 1.7 oz/week    1 burbon  . Drug use: No   Current Outpatient Prescriptions on File Prior to Visit  Medication Sig Dispense Refill  . amLODipine (NORVASC) 10 MG tablet TAKE 1 TABLET (10 MG TOTAL) BY MOUTH DAILY. 90 tablet 3  . aspirin 81 MG tablet Take 81 mg by mouth daily.      Marland Kitchen atorvastatin (LIPITOR) 20 MG tablet TAKE 1 TABLET EVERY DAY 100 tablet 3  . FREESTYLE LITE test strip USE TO TEST GLUCOSE DAILY OR AS DIRECTED BY YOUR DOCTOR 100 each 1  . glipiZIDE (GLUCOTROL) 5 MG tablet TAKE  1 TABLET BY MOUTH BEFORE BREAKFAST AND ONE BEFORE YOUR EVENING MEAL 200 tablet 3  . hydrochlorothiazide (HYDRODIURIL) 12.5 MG tablet Take 1 tablet (12.5 mg total) by mouth daily as needed. 90 tablet 3  . losartan (COZAAR) 100 MG tablet Take 1 tablet (100 mg total) by mouth daily. 100 tablet 3  . metFORMIN (GLUCOPHAGE) 1000 MG tablet TAKE 1 TABLET BEFORE BREAKFAST AND 1 TABLET BEFORE YOUR EVENING MEAL 200 tablet 3  . Multiple Vitamin (MULTIVITAMIN) tablet Take 1 tablet by mouth daily.      . Omega-3 Fatty Acids (FISH OIL) 1000 MG CAPS Take by mouth daily.      . pantoprazole (PROTONIX) 40 MG tablet Take 1 tablet (40 mg total) by mouth daily. 100 tablet 3  . sildenafil (REVATIO) 20 MG tablet Take 1 tablet (20 mg total) by mouth at bedtime as needed. 30 tablet 11  . zolpidem (AMBIEN) 5 MG tablet One half tablet each bedtime when necessary 60 tablet 3   No current facility-administered medications on file prior to visit.    Allergies  Allergen Reactions  . Hctz [Hydrochlorothiazide] Hives, Itching and Rash   Family History  Problem Relation Age of Onset  . Healthy Mother   . Heart attack Father 20  . Healthy Brother   . Colon polyps Sister   . Heart disease      family histrory  . Diabetes Paternal Grandmother    PE: BP (!) 145/79   Pulse 84   Ht 6\' 1"  (1.854 m)   Wt 245 lb (111.1 kg)   BMI 32.32 kg/m  Wt Readings from Last 3 Encounters:  09/21/16 245 lb (111.1 kg)  08/04/16 241 lb 9.6 oz (109.6 kg)  02/04/15 243 lb (110.2 kg)   Constitutional: overweight, in NAD Eyes: PERRLA, EOMI, no exophthalmos ENT: moist mucous membranes, no thyromegaly, no cervical lymphadenopathy Cardiovascular: RRR, No MRG Respiratory: CTA B Gastrointestinal: abdomen soft, NT, ND, BS+ Musculoskeletal: no deformities, strength intact in all 4 Skin: moist, warm, + Stasis dermatitis bilateral Neurological: no tremor with outstretched hands, DTR normal in all 4  ASSESSMENT: 1. DM2,  non-insulin-dependent, uncontrolled, without long term complications, but with hyperglycemia  PLAN:  1. Patient with long-standing, uncontrolled diabetes, on oral antidiabetic regimen, which became insufficient. He is only checking sugars in the morning, and I strongly advised him to start rotating check times. We also discussed about improving his diet, by eliminating juice, which he drinks daily with breakfast, and also eliminating cereals for dinner. We also discussed about starting an organized exercise regimen, at least 30 minutes a day. We will also add a DPP 4 inhibitor to his regimen. No history of pancreatitis. - I suggested to:  Patient Instructions  Please continue: - Metformin 1000 mg 2x a day, with meals - Glipizide 5 mg 2x a day, before meals  Please start: - Januvia 100 mg before breakfast  Please return in 1.5 months with your sugar log.   - Strongly advised him to start checking sugars at different times of the day - check 1-2 times a day, rotating checks - given sugar log and advised how to fill it and to bring it at next appt  - given foot care handout and explained the principles  - given instructions for hypoglycemia management "15-15 rule"  - advised for yearly eye exams >> he is up-to-date - Return to clinic in 1.5 mo with sugar log   Philemon Kingdom, MD PhD [facility] Endocrinology

## 2016-09-21 NOTE — Patient Instructions (Addendum)
Please continue: - Metformin 1000 mg 2x a day, with meals - Glipizide 5 mg 2x a day, before meals  Please start: - Januvia 100 mg before breakfast  Please return in 1.5 months with your sugar log.   PATIENT INSTRUCTIONS FOR TYPE 2 DIABETES:  **Please join MyChart!** - see attached instructions about how to join if you have not done so already.  DIET AND EXERCISE Diet and exercise is an important part of diabetic treatment.  We recommended aerobic exercise in the form of brisk walking (working between 40-60% of maximal aerobic capacity, similar to brisk walking) for 150 minutes per week (such as 30 minutes five days per week) along with 3 times per week performing 'resistance' training (using various gauge rubber tubes with handles) 5-10 exercises involving the major muscle groups (upper body, lower body and core) performing 10-15 repetitions (or near fatigue) each exercise. Start at half the above goal but build slowly to reach the above goals. If limited by weight, joint pain, or disability, we recommend daily walking in a swimming pool with water up to waist to reduce pressure from joints while allow for adequate exercise.    BLOOD GLUCOSES Monitoring your blood glucoses is important for continued management of your diabetes. Please check your blood glucoses 2-4 times a day: fasting, before meals and at bedtime (you can rotate these measurements - e.g. one day check before the 3 meals, the next day check before 2 of the meals and before bedtime, etc.).   HYPOGLYCEMIA (low blood sugar) Hypoglycemia is usually a reaction to not eating, exercising, or taking too much insulin/ other diabetes drugs.  Symptoms include tremors, sweating, hunger, confusion, headache, etc. Treat IMMEDIATELY with 15 grams of Carbs: . 4 glucose tablets .  cup regular juice/soda . 2 tablespoons raisins . 4 teaspoons sugar . 1 tablespoon honey Recheck blood glucose in 15 mins and repeat above if still  symptomatic/blood glucose <100.  RECOMMENDATIONS TO REDUCE YOUR RISK OF DIABETIC COMPLICATIONS: * Take your prescribed MEDICATION(S) * Follow a DIABETIC diet: Complex carbs, fiber rich foods, (monounsaturated and polyunsaturated) fats * AVOID saturated/trans fats, high fat foods, >2,300 mg salt per day. * EXERCISE at least 5 times a week for 30 minutes or preferably daily.  * DO NOT SMOKE OR DRINK more than 1 drink a day. * Check your FEET every day. Do not wear tightfitting shoes. Contact us if you develop an ulcer * See your EYE doctor once a year or more if needed * Get a FLU shot once a year * Get a PNEUMONIA vaccine once before and once after age 57 years  GOALS:  * Your Hemoglobin A1c of <7%  * fasting sugars need to be <130 * after meals sugars need to be <180 (2h after you start eating) * Your Systolic BP should be [id number] or lower  * Your Diastolic BP should be 80 or lower  * Your HDL (Good Cholesterol) should be 40 or higher  * Your LDL (Bad Cholesterol) should be 100 or lower. * Your Triglycerides should be 150 or lower  * Your Urine microalbumin (kidney function) should be <30 * Your Body Mass Index should be 25 or lower    Please consider the following ways to cut down carbs and fat and increase fiber and micronutrients in your diet: - substitute whole grain for white bread or pasta - substitute brown rice for white rice - substitute 90-calorie flat bread pieces for slices of bread when possible - substitute  sweet potatoes or yams for white potatoes - substitute humus for margarine - substitute tofu for cheese when possible - substitute almond or rice milk for regular milk (would not drink soy milk daily due to concern for soy estrogen influence on breast cancer risk) - substitute dark chocolate for other sweets when possible - substitute water - can add lemon or orange slices for taste - for diet sodas (artificial sweeteners will trick your body that you can eat sweets  without getting calories and will lead you to overeating and weight gain in the long run) - do not skip breakfast or other meals (this will slow down the metabolism and will result in more weight gain over time)  - can try smoothies made from fruit and almond/rice milk in am instead of regular breakfast - can also try old-fashioned (not instant) oatmeal made with almond/rice milk in am - order the dressing on the side when eating salad at a restaurant (pour less than half of the dressing on the salad) - eat as little meat as possible - can try juicing, but should not forget that juicing will get rid of the fiber, so would alternate with eating raw veg./fruits or drinking smoothies - use as little oil as possible, even when using olive oil - can dress a salad with a mix of balsamic vinegar and lemon juice, for e.g. - use agave nectar, stevia sugar, or regular sugar rather than artificial sweateners - steam or broil/roast veggies  - snack on veggies/fruit/nuts (unsalted, preferably) when possible, rather than processed foods - reduce or eliminate aspartame in diet (it is in diet sodas, chewing gum, etc) Read the labels!  Try to read Dr. Janene Harvey book: "Program for Reversing Diabetes" for other ideas for healthy eating.

## 2016-10-18 ENCOUNTER — Encounter: Payer: Self-pay | Admitting: Family Medicine

## 2016-10-20 ENCOUNTER — Other Ambulatory Visit: Payer: Self-pay | Admitting: Emergency Medicine

## 2016-10-20 MED ORDER — GLUCOSE BLOOD VI STRP: ORAL_STRIP | 1 refills | Status: DC

## 2016-11-03 ENCOUNTER — Encounter: Payer: Self-pay | Admitting: Internal Medicine

## 2016-11-03 ENCOUNTER — Ambulatory Visit (INDEPENDENT_AMBULATORY_CARE_PROVIDER_SITE_OTHER): Payer: BLUE CROSS/BLUE SHIELD | Admitting: Internal Medicine

## 2016-11-03 VITALS — BP 130/84 | HR 84 | Ht 73.5 in | Wt 246.0 lb

## 2016-11-03 DIAGNOSIS — E1165 Type 2 diabetes mellitus with hyperglycemia: Secondary | ICD-10-CM | POA: Diagnosis not present

## 2016-11-03 LAB — POCT GLYCOSYLATED HEMOGLOBIN (HGB A1C): HEMOGLOBIN A1C: 8

## 2016-11-03 MED ORDER — GLIPIZIDE 5 MG PO TABS: ORAL_TABLET | ORAL | 3 refills | Status: DC

## 2016-11-03 MED ORDER — SITAGLIPTIN PHOSPHATE 100 MG PO TABS: 100.0000 mg | ORAL_TABLET | Freq: Every day | ORAL | 3 refills | Status: DC

## 2016-11-03 NOTE — Progress Notes (Signed)
Patient ID: Derrick Brown, male   DOB: December 04, 1950, 66 y.o.   MRN: [id number]   HPI: Derrick Brown is a 66 y.o.-year-old male, returning for follow-up for DM2, dx in ~2007, non-insulin-dependent, uncontrolled, without long term complications. Last visit 1.5 months ago.  Last hemoglobin A1c was: Lab Results  Component Value Date   HGBA1C 8.6 (H) 07/28/2016   HGBA1C 7.3 (H) 05/07/2015   HGBA1C 7.4 (H) 01/25/2015   Pt is on a regimen of: - Metformin 1000 mg 2x a day, with meals - Glipizide 5 mg 2x a day, before meals - Januvia 100 mg in a.m. - added 09/2016  Pt checks his sugars 0-1x a day and they are: - am: n/c >> 120-140 in the past; now 140-180 >> 137, 146-191, 213, 380 x1 (m[place]aritas at night) - 2h after b'fast: n/c >> 198, 200 - before lunch: n/c - 2h after lunch: n/c - before dinner: n/c - 2h after dinner: n/c - bedtime: n/c >> 152-239, 260 - nighttime: n/c No lows. Lowest sugar was 120 >> 137. Highest sugar was 220 >> 380x1.  Glucometer: Freestyle Lite  Pt's meals are: - Breakfast: oatmeal + blueberries + pecan; cereals; fruit juice; coffee - Lunch: chicken salad or sandwich - late - Dinner: cereal or light dinner - late - Snacks: takes snack boxes to work: yoghurt, veggies, grapes/prunes, chips/cheetos  No exercise. Stays active at work.  - no CKD, last BUN/creatinine:  Lab Results  Component Value Date   BUN 11 07/28/2016   BUN 11 05/07/2015   CREATININE 0.68 07/28/2016   CREATININE 0.77 05/07/2015  On Losartan. - last set of lipids: Lab Results  Component Value Date   CHOL 138 07/28/2016   HDL 20.40 (L) 07/28/2016   LDLCALC 75 09/08/2013   LDLDIRECT 75.0 07/28/2016   TRIG 286.0 (H) 07/28/2016   CHOLHDL 7 07/28/2016  On Lipitor. - last eye exam was: 01/13/2016: DR. Gershon Crane. No DR.  - no numbness and tingling in his feet.  He also has HL, HTN, GERD.  ROS: Constitutional: no weight gain/loss, no fatigue Eyes: no blurry vision, no xerophthalmia ENT: no  sore throat, no nodules palpated in throat, no dysphagia/odynophagia, no hoarseness Cardiovascular: no CP/SOB/no palpitations/leg swelling Respiratory: no cough/SOB Gastrointestinal: no N/V/D/C/heartburn Musculoskeletal: no muscle/joint aches Skin: no rashes Neurological: no tremors/numbness/tingling/dizziness  I reviewed pt's medications, allergies, PMH, social hx, family hx, and changes were documented in the history of present illness. Otherwise, unchanged from my initial visit note.  Past Medical History:  Diagnosis Date  . ACNE ROSACEA 02/21/2009  . COUGH DUE TO ACE INHIBITORS 04/12/2009  . Diabetes mellitus without complication ([address])    type 2  . DYSPHAGIA 05/15/2009  . ESOPHAGITIS, REFLUX 05/13/2009  . FATIGUE 03/14/2010  . Fatty liver   . GERD (gastroesophageal reflux disease)   . HYPERGLYCEMIA 02/21/2009  . HYPERLIPIDEMIA 02/21/2009  . HYPERTENSION NEC 02/21/2009  . INSOMNIA 04/02/2010  . MUSCLE PAIN 05/13/2009  . OBSTRUCTIVE SLEEP APNEA 04/02/2010  . RENAL CALCULUS 02/21/2009  . TEMPOROMANDIBULAR JOINT DISORDER 06/10/2009  . Unspecified hypertrophic and atrophic condition of skin    Past Surgical History:  Procedure Laterality Date  . COLONOSCOPY    . ESOPHAGOGASTRODUODENOSCOPY    . KIDNEY STONE SURGERY     removal   Social History   Social History  . Marital status: Married    Spouse name: N/A  . Number of children:    Occupational History  .    Social History Main Topics  .  Smoking status: Never Smoker  . Smokeless tobacco: Never Used  . Alcohol use 1.7 oz/week    1 burbon  . Drug use: No   Current Outpatient Prescriptions on File Prior to Visit  Medication Sig Dispense Refill  . amLODipine (NORVASC) 10 MG tablet TAKE 1 TABLET (10 MG TOTAL) BY MOUTH DAILY. 90 tablet 3  . aspirin 81 MG tablet Take 81 mg by mouth daily.      Marland Kitchen atorvastatin (LIPITOR) 20 MG tablet TAKE 1 TABLET EVERY DAY 100 tablet 3  . glipiZIDE (GLUCOTROL) 5 MG tablet TAKE 1 TABLET BY MOUTH BEFORE  BREAKFAST AND ONE BEFORE YOUR EVENING MEAL 200 tablet 3  . glucose blood (FREESTYLE LITE) test strip USE TO TEST GLUCOSE DAILY OR AS DIRECTED BY YOUR DOCTOR 100 each 1  . hydrochlorothiazide (HYDRODIURIL) 12.5 MG tablet Take 1 tablet (12.5 mg total) by mouth daily as needed. 90 tablet 3  . losartan (COZAAR) 100 MG tablet Take 1 tablet (100 mg total) by mouth daily. 100 tablet 3  . metFORMIN (GLUCOPHAGE) 1000 MG tablet TAKE 1 TABLET BEFORE BREAKFAST AND 1 TABLET BEFORE YOUR EVENING MEAL 200 tablet 3  . Multiple Vitamin (MULTIVITAMIN) tablet Take 1 tablet by mouth daily.      . Omega-3 Fatty Acids (FISH OIL) 1000 MG CAPS Take by mouth daily.      . pantoprazole (PROTONIX) 40 MG tablet Take 1 tablet (40 mg total) by mouth daily. 100 tablet 3  . sildenafil (REVATIO) 20 MG tablet Take 1 tablet (20 mg total) by mouth at bedtime as needed. 30 tablet 11  . sitaGLIPtin (JANUVIA) 100 MG tablet Take 1 tablet (100 mg total) by mouth daily. 30 tablet 5  . zolpidem (AMBIEN) 5 MG tablet One half tablet each bedtime when necessary 60 tablet 3   No current facility-administered medications on file prior to visit.    Allergies  Allergen Reactions  . Hctz [Hydrochlorothiazide] Hives, Itching and Rash   Family History  Problem Relation Age of Onset  . Healthy Mother   . Heart attack Father 107  . Healthy Brother   . Colon polyps Sister   . Heart disease      family histrory  . Diabetes Paternal Grandmother    PE: There were no vitals taken for this visit. Wt Readings from Last 3 Encounters:  09/21/16 245 lb (111.1 kg)  08/04/16 241 lb 9.6 oz (109.6 kg)  02/04/15 243 lb (110.2 kg)   Constitutional: overweight, in NAD Eyes: PERRLA, EOMI, no exophthalmos ENT: moist mucous membranes, no thyromegaly, no cervical lymphadenopathy Cardiovascular: RRR, No MRG Respiratory: CTA B Gastrointestinal: abdomen soft, NT, ND, BS+ Musculoskeletal: no deformities, strength intact in all 4 Skin: moist, warm, +  Stasis dermatitis bilateral Neurological: no tremor with outstretched hands, DTR normal in all 4  ASSESSMENT: 1. DM2, non-insulin-dependent, uncontrolled, without long term complications, but with hyperglycemia  PLAN:  1. Patient with long-standing, uncontrolled diabetes, on oral antidiabetic regimen, with metformin and glipizide, to which we added Januvia at last visit. Sugars are high, with the Holidays, especially at bedtime (which is 1-1.5 hrs after dinner as he comes home at 8 pm). Subsequently, her sugars in am are also high. Discussed about changing his diet but will also increase Glipizide before dinner. At next visit, may need a GLP1 R agonist.  - advised him to also check some sugars mid-day - I suggested to:  Patient Instructions  Please continue: - Metformin 1000 mg 2x a day, with meals -  Januvia 100 mg before breakfast  Please increase Glipizide: -  Glipizide 5 mg in am and 10 mg before dinner.  Please return in 3 months with your sugar log.   - continue checking sugars at different times of the day - check 1-2 times a day, rotating checks - advised for yearly eye exams >> he is up-to-date - Return to clinic in 3 mo with sugar log   Philemon Kingdom, MD PhD [facility] Endocrinology

## 2016-11-03 NOTE — Patient Instructions (Addendum)
Please continue: - Metformin 1000 mg 2x a day, with meals - Januvia 100 mg before breakfast  Please increase Glipizide: -  Glipizide 5 mg in am and 10 mg before dinner.  Please return in 3 months with your sugar log.

## 2016-11-03 NOTE — Addendum Note (Signed)
Addended by: Caprice Beaver T on: 11/03/2016 10:53 AM   Modules accepted: Orders

## 2016-11-17 ENCOUNTER — Encounter: Payer: Self-pay | Admitting: Family Medicine

## 2017-01-29 ENCOUNTER — Other Ambulatory Visit: Payer: Self-pay | Admitting: Family Medicine

## 2017-01-29 NOTE — Telephone Encounter (Signed)
Will send to endo for help.  Not sure how often pt is testing.

## 2017-02-03 ENCOUNTER — Other Ambulatory Visit: Payer: Self-pay | Admitting: Family Medicine

## 2017-02-03 NOTE — Telephone Encounter (Signed)
Will send to endo for help.  Not sure how often the pt is testing.

## 2017-02-05 ENCOUNTER — Ambulatory Visit (INDEPENDENT_AMBULATORY_CARE_PROVIDER_SITE_OTHER): Payer: BLUE CROSS/BLUE SHIELD | Admitting: Internal Medicine

## 2017-02-05 ENCOUNTER — Encounter: Payer: Self-pay | Admitting: Internal Medicine

## 2017-02-05 VITALS — BP 144/78 | HR 65 | Ht 73.5 in | Wt 241.1 lb

## 2017-02-05 DIAGNOSIS — E1165 Type 2 diabetes mellitus with hyperglycemia: Secondary | ICD-10-CM

## 2017-02-05 LAB — POCT GLYCOSYLATED HEMOGLOBIN (HGB A1C): Hemoglobin A1C: 8.4

## 2017-02-05 MED ORDER — CANAGLIFLOZIN 100 MG PO TABS: 100.0000 mg | ORAL_TABLET | Freq: Every day | ORAL | 3 refills | Status: DC

## 2017-02-05 NOTE — Progress Notes (Signed)
Patient ID: Derrick Brown, male   DOB: August 20, 1951, 66 y.o.   MRN: [id number]   HPI: Derrick Brown is a 66 y.o.-year-old male, returning for follow-up for DM2, dx in ~2007, non-insulin-dependent, uncontrolled, without long term complications. Last visit 3 months ago.  Last hemoglobin A1c was: Lab Results  Component Value Date   HGBA1C 8.0 11/03/2016   HGBA1C 8.6 (H) 07/28/2016   HGBA1C 7.3 (H) 05/07/2015   Pt is on a regimen of: - Metformin 1000 mg 2x a day, with meals - Glipizide 5 mg in am and 10 mg before dinner - increased 10/2016 - Januvia 100 mg in a.m. - added 09/2016  Pt checks his sugars now 2x a day and they are: - am: 120-140 in the past; now 140-180 >> 137, 146-191, 213, 380 x1 (m[place]aritas at night) >> 144, 159-222, 240 - 2h after b'fast: n/c >> 198, 200 >> n/c - before lunch: n/c - 2h after lunch: n/c - before dinner: n/c - 2h after dinner: n/c - bedtime: n/c >> 152-239, 260 >> 163-270 - nighttime: n/c No lows. Lowest sugar was 120 >> 137 > 144. Highest sugar was 220 >> 380x1 >> 270.  Glucometer: Freestyle Lite  Pt's meals are: - Breakfast: oatmeal + blueberries + pecan; cereals; fruit juice; coffee - Lunch: chicken salad or sandwich - late - Dinner: cereal or light dinner - late - Snacks: takes snack boxes to work: yoghurt, veggies, grapes/prunes, chips/cheetos No exercise. Stays active at work.  - no CKD, last BUN/creatinine:  Lab Results  Component Value Date   BUN 11 07/28/2016   BUN 11 05/07/2015   CREATININE 0.68 07/28/2016   CREATININE 0.77 05/07/2015  On Losartan. - last set of lipids: Lab Results  Component Value Date   CHOL 138 07/28/2016   HDL 20.40 (L) 07/28/2016   LDLCALC 75 09/08/2013   LDLDIRECT 75.0 07/28/2016   TRIG 286.0 (H) 07/28/2016   CHOLHDL 7 07/28/2016  On Lipitor. - last eye exam was: 01/13/2016: DR. Gershon Crane. No DR. Has an appt in few days. - no numbness and tingling in his feet.  He also has HL, HTN,  GERD.  ROS: Constitutional: no weight gain/loss, no fatigue, no subjective hyperthermia/hypothermia Eyes: no blurry vision, no xerophthalmia ENT: no sore throat, no nodules palpated in throat, no dysphagia/odynophagia, no hoarseness Cardiovascular: no CP/SOB/palpitations/leg swelling Respiratory: no cough/SOB Gastrointestinal: no N/V/D/C Musculoskeletal: no muscle/joint aches Skin: no rashes Neurological: no tremors/numbness/tingling/dizziness  I reviewed pt's medications, allergies, PMH, social hx, family hx, and changes were documented in the history of present illness. Otherwise, unchanged from my initial visit note.  Past Medical History:  Diagnosis Date  . ACNE ROSACEA 02/21/2009  . COUGH DUE TO ACE INHIBITORS 04/12/2009  . Diabetes mellitus without complication ([place])    type 2  . DYSPHAGIA 05/15/2009  . ESOPHAGITIS, REFLUX 05/13/2009  . FATIGUE 03/14/2010  . Fatty liver   . GERD (gastroesophageal reflux disease)   . HYPERGLYCEMIA 02/21/2009  . HYPERLIPIDEMIA 02/21/2009  . HYPERTENSION NEC 02/21/2009  . INSOMNIA 04/02/2010  . MUSCLE PAIN 05/13/2009  . OBSTRUCTIVE SLEEP APNEA 04/02/2010  . RENAL CALCULUS 02/21/2009  . TEMPOROMANDIBULAR JOINT DISORDER 06/10/2009  . Unspecified hypertrophic and atrophic condition of skin    Past Surgical History:  Procedure Laterality Date  . COLONOSCOPY    . ESOPHAGOGASTRODUODENOSCOPY    . KIDNEY STONE SURGERY     removal   Social History   Social History  . Marital status: Married    Spouse  name: N/A   Social History Main Topics  . Smoking status: Never Smoker  . Smokeless tobacco: Never Used  . Alcohol use 1.7 oz/week    1 burbon  . Drug use: No   Current Outpatient Prescriptions on File Prior to Visit  Medication Sig Dispense Refill  . amLODipine (NORVASC) 10 MG tablet TAKE 1 TABLET (10 MG TOTAL) BY MOUTH DAILY. 90 tablet 3  . aspirin 81 MG tablet Take 81 mg by mouth daily.      Marland Kitchen atorvastatin (LIPITOR) 20 MG tablet TAKE 1 TABLET EVERY  DAY 100 tablet 3  . glipiZIDE (GLUCOTROL) 5 MG tablet TAKE 1 TABLET BY MOUTH BEFORE BREAKFAST AND 2 BEFORE YOUR EVENING MEAL 270 tablet 3  . glucose blood (FREESTYLE LITE) test strip USE TO TEST GLUCOSE DAILY OR AS DIRECTED BY YOUR DOCTOR 100 each 1  . hydrochlorothiazide (HYDRODIURIL) 12.5 MG tablet Take 1 tablet (12.5 mg total) by mouth daily as needed. 90 tablet 3  . losartan (COZAAR) 100 MG tablet Take 1 tablet (100 mg total) by mouth daily. 100 tablet 3  . metFORMIN (GLUCOPHAGE) 1000 MG tablet TAKE 1 TABLET BEFORE BREAKFAST AND 1 TABLET BEFORE YOUR EVENING MEAL 200 tablet 3  . Multiple Vitamin (MULTIVITAMIN) tablet Take 1 tablet by mouth daily.      . Omega-3 Fatty Acids (FISH OIL) 1000 MG CAPS Take by mouth daily.      . pantoprazole (PROTONIX) 40 MG tablet Take 1 tablet (40 mg total) by mouth daily. 100 tablet 3  . sildenafil (REVATIO) 20 MG tablet Take 1 tablet (20 mg total) by mouth at bedtime as needed. 30 tablet 11  . sitaGLIPtin (JANUVIA) 100 MG tablet Take 1 tablet (100 mg total) by mouth daily. 90 tablet 3  . zolpidem (AMBIEN) 5 MG tablet One half tablet each bedtime when necessary 60 tablet 3   No current facility-administered medications on file prior to visit.    Allergies  Allergen Reactions  . Hctz [Hydrochlorothiazide] Hives, Itching and Rash   Family History  Problem Relation Age of Onset  . Healthy Mother   . Heart attack Father 57  . Healthy Brother   . Colon polyps Sister   . Heart disease      family histrory  . Diabetes Paternal Grandmother    PE: BP (!) 144/78   Pulse 65   Ht 6' 1.5" (1.867 m)   Wt 241 lb 1.6 oz (109.4 kg)   BMI 31.38 kg/m  Wt Readings from Last 3 Encounters:  02/05/17 241 lb 1.6 oz (109.4 kg)  11/03/16 246 lb (111.6 kg)  09/21/16 245 lb (111.1 kg)   Constitutional: overweight, in NAD Eyes: PERRLA, EOMI, no exophthalmos ENT: moist mucous membranes, no thyromegaly, no cervical lymphadenopathy Cardiovascular: RRR, No  MRG Respiratory: CTA B Gastrointestinal: abdomen soft, NT, ND, BS+ Musculoskeletal: no deformities, strength intact in all 4 Skin: moist, warm, no rashes, + stasis dermatitis B Neurological: no tremor with outstretched hands, DTR normal in all 4  ASSESSMENT: 1. DM2, non-insulin-dependent, uncontrolled, without long term complications, but with hyperglycemia  PLAN:  1. Patient with long-standing, uncontrolled diabetes, on oral antidiabetic regimen, with metformin, januvia, glipizide, with slightly higher sugars than at last visit >> we discussed about starting Invokana. we discussed about SEs of Invokana, which are: dizziness (advised to be careful when stands from sitting position), decreased BP - usually not < normal (BP today is not low), and fungal UTIs (advised to let me know if develops one).  -  may need a GLP1 R agonist at next visit - I suggested to:  Patient Instructions  Please continue: - Metformin 1000 mg 2x a day, with meals - Januvia 100 mg before breakfast - Glipizide 5 mg in am and 10 mg before dinner.  Please start Invokana 100 mg in am.  Please return in 3 months with your sugar log.   - continue checking sugars at different times of the day - check 1-2 times a day, rotating checks - advised for yearly eye exams >> he needs a new one >> scheduled  - Return to clinic in 3 mo with sugar log   HbA1c higher, as expected: Lab Results  Component Value Date   HGBA1C 8.4 02/05/2017   HGBA1C 8.0 11/03/2016   HGBA1C 8.6 (H) 07/28/2016   Philemon Kingdom, MD PhD [facility] Endocrinology

## 2017-02-05 NOTE — Patient Instructions (Addendum)
Please continue: - Metformin 1000 mg 2x a day, with meals - Januvia 100 mg before breakfast - Glipizide 5 mg in am and 10 mg before dinner.  Please start Invokana 100 mg in am.  Please return in 3 months with your sugar log.

## 2017-02-08 DIAGNOSIS — H5201 Hypermetropia, right eye: Secondary | ICD-10-CM | POA: Diagnosis not present

## 2017-02-08 DIAGNOSIS — H2513 Age-related nuclear cataract, bilateral: Secondary | ICD-10-CM | POA: Diagnosis not present

## 2017-02-08 DIAGNOSIS — H52201 Unspecified astigmatism, right eye: Secondary | ICD-10-CM | POA: Diagnosis not present

## 2017-02-08 DIAGNOSIS — E119 Type 2 diabetes mellitus without complications: Secondary | ICD-10-CM | POA: Diagnosis not present

## 2017-02-08 DIAGNOSIS — H524 Presbyopia: Secondary | ICD-10-CM | POA: Diagnosis not present

## 2017-02-13 ENCOUNTER — Encounter: Payer: Self-pay | Admitting: Family Medicine

## 2017-02-13 DIAGNOSIS — H9190 Unspecified hearing loss, unspecified ear: Secondary | ICD-10-CM

## 2017-02-13 DIAGNOSIS — H9319 Tinnitus, unspecified ear: Secondary | ICD-10-CM

## 2017-02-15 NOTE — Telephone Encounter (Signed)
Call the ENT office and set up a consult with Dr. Janace Hoard for evaluation the tinnitus and hearing loss

## 2017-03-04 ENCOUNTER — Other Ambulatory Visit: Payer: Self-pay

## 2017-03-04 ENCOUNTER — Telehealth: Payer: Self-pay | Admitting: Internal Medicine

## 2017-03-04 ENCOUNTER — Other Ambulatory Visit: Payer: Self-pay | Admitting: Family Medicine

## 2017-03-04 MED ORDER — GLUCOSE BLOOD VI STRP: ORAL_STRIP | 1 refills | Status: DC

## 2017-03-04 NOTE — Telephone Encounter (Signed)
Called patient and LVM advising him to call back on which onetouch he uses and I would be glad to send in strips for him. Gave call back number.

## 2017-03-04 NOTE — Telephone Encounter (Signed)
Pt's wife called and said that he uses the [facility] Lite Test Strips

## 2017-03-04 NOTE — Telephone Encounter (Signed)
Submitted

## 2017-03-04 NOTE — Telephone Encounter (Signed)
Pt needs test strips for his meter he thinks its a one touch meter called into [facility] on [address]

## 2017-03-08 ENCOUNTER — Ambulatory Visit: Payer: BLUE CROSS/BLUE SHIELD | Admitting: Endocrinology

## 2017-03-10 DIAGNOSIS — H903 Sensorineural hearing loss, bilateral: Secondary | ICD-10-CM | POA: Diagnosis not present

## 2017-03-10 DIAGNOSIS — H9313 Tinnitus, bilateral: Secondary | ICD-10-CM | POA: Diagnosis not present

## 2017-03-28 ENCOUNTER — Encounter: Payer: Self-pay | Admitting: Internal Medicine

## 2017-03-29 ENCOUNTER — Other Ambulatory Visit: Payer: Self-pay

## 2017-03-29 MED ORDER — GLIPIZIDE 5 MG PO TABS: ORAL_TABLET | ORAL | 3 refills | Status: DC

## 2017-04-06 ENCOUNTER — Encounter: Payer: Self-pay | Admitting: Internal Medicine

## 2017-04-07 ENCOUNTER — Other Ambulatory Visit: Payer: Self-pay

## 2017-04-07 MED ORDER — METFORMIN HCL ER 750 MG PO TB24: ORAL_TABLET | ORAL | 1 refills | Status: DC

## 2017-04-09 ENCOUNTER — Other Ambulatory Visit: Payer: Self-pay

## 2017-04-09 MED ORDER — METFORMIN HCL ER 750 MG PO TB24: ORAL_TABLET | ORAL | 1 refills | Status: DC

## 2017-04-20 ENCOUNTER — Telehealth: Payer: Self-pay

## 2017-04-20 ENCOUNTER — Encounter: Payer: Self-pay | Admitting: Family Medicine

## 2017-04-20 DIAGNOSIS — F5101 Primary insomnia: Secondary | ICD-10-CM

## 2017-04-20 MED ORDER — ZOLPIDEM TARTRATE 5 MG PO TABS: ORAL_TABLET | ORAL | 3 refills | Status: DC

## 2017-04-20 NOTE — Telephone Encounter (Signed)
Pt Rx for Ambien 5 mg was phoned  Into pt pharmacy #[id number] with 3 refills, Pt is aware.

## 2017-05-19 ENCOUNTER — Encounter: Payer: Self-pay | Admitting: Family Medicine

## 2017-05-21 ENCOUNTER — Ambulatory Visit (INDEPENDENT_AMBULATORY_CARE_PROVIDER_SITE_OTHER): Payer: BLUE CROSS/BLUE SHIELD | Admitting: Family Medicine

## 2017-05-21 ENCOUNTER — Encounter: Payer: Self-pay | Admitting: Family Medicine

## 2017-05-21 VITALS — BP 120/76 | HR 71 | Temp 98.7°F | Wt 233.5 lb

## 2017-05-21 DIAGNOSIS — H66001 Acute suppurative otitis media without spontaneous rupture of ear drum, right ear: Secondary | ICD-10-CM | POA: Diagnosis not present

## 2017-05-21 MED ORDER — AMOXICILLIN-POT CLAVULANATE 875-125 MG PO TABS: 1.0000 | ORAL_TABLET | Freq: Two times a day (BID) | ORAL | 0 refills | Status: DC

## 2017-05-21 NOTE — Progress Notes (Signed)
Subjective:     Patient ID: Derrick Brown, male   DOB: [birth year], 66 y.o.   MRN: [id number]  HPI Patient seen for acute visit. He had 2 weeks of some progressive nasal congestion and cough. Cough mostly dry location but no green sputum. He's had some frontal sinus pressure. He's also developed right ear pain past few days. No drainage. He states his hearing is slightly muffled but for the most part intact. Tried over-the-counter DayQuil and NyQuil without relief. He's had some general malaise. No fevers or chills. Denies any history of frequent sinusitis. Nonsmoker. Allergy to HCTZ.  Past Medical History:  Diagnosis Date  . ACNE ROSACEA 02/21/2009  . COUGH DUE TO ACE INHIBITORS 04/12/2009  . Diabetes mellitus without complication ([place])    type 2  . DYSPHAGIA 05/15/2009  . ESOPHAGITIS, REFLUX 05/13/2009  . FATIGUE 03/14/2010  . Fatty liver   . GERD (gastroesophageal reflux disease)   . HYPERGLYCEMIA 02/21/2009  . HYPERLIPIDEMIA 02/21/2009  . HYPERTENSION NEC 02/21/2009  . INSOMNIA 04/02/2010  . MUSCLE PAIN 05/13/2009  . OBSTRUCTIVE SLEEP APNEA 04/02/2010  . RENAL CALCULUS 02/21/2009  . TEMPOROMANDIBULAR JOINT DISORDER 06/10/2009  . Unspecified hypertrophic and atrophic condition of skin    Past Surgical History:  Procedure Laterality Date  . COLONOSCOPY    . ESOPHAGOGASTRODUODENOSCOPY    . KIDNEY STONE SURGERY     removal    reports that he has never smoked. He has never used smokeless tobacco. He reports that he drinks about 1.7 oz of alcohol per week . He reports that he does not use drugs. family history includes Colon polyps in his sister; Diabetes in his paternal grandmother; Healthy in his brother and mother; Heart attack (age of onset: 66) in his father; Heart disease in his unknown relative. Allergies  Allergen Reactions  . Hctz [Hydrochlorothiazide] Hives, Itching and Rash     Review of Systems  Constitutional: Positive for fatigue. Negative for chills and fever.  HENT: Positive for  ear pain, sinus pain, sinus pressure and sore throat. Negative for ear discharge and nosebleeds.   Respiratory: Positive for cough. Negative for shortness of breath and wheezing.        Objective:   Physical Exam  Constitutional: He appears well-developed and well-nourished.  HENT:  Mouth/Throat: Oropharynx is clear and moist.  Left eardrum appears normal. Right eardrum is very erythematous with somewhat distorted landmarks. No obvious perforation.  Neck: Neck supple.  Cardiovascular: Normal rate and regular rhythm.   Pulmonary/Chest: Effort normal and breath sounds normal. No respiratory distress. He has no wheezes. He has no rales.  Lymphadenopathy:    He has no cervical adenopathy.       Assessment:     Patient has acute right otitis media and suspected acute frontal sinusitis with progressive symptoms over 2 week duration    Plan:     -Start Augmentin 875 mg twice daily for 10 days -Patient will continue with Tylenol or Advil as needed for discomfort -Recommend reexamine in 2-3 weeks if symptoms not fully resolving  Eulas Post MD [facility]

## 2017-05-21 NOTE — Patient Instructions (Signed)

## 2017-05-26 ENCOUNTER — Ambulatory Visit (INDEPENDENT_AMBULATORY_CARE_PROVIDER_SITE_OTHER): Payer: BLUE CROSS/BLUE SHIELD | Admitting: Internal Medicine

## 2017-05-26 ENCOUNTER — Encounter: Payer: Self-pay | Admitting: Internal Medicine

## 2017-05-26 VITALS — BP 130/72 | HR 75 | Ht 74.0 in | Wt 232.0 lb

## 2017-05-26 DIAGNOSIS — E663 Overweight: Secondary | ICD-10-CM

## 2017-05-26 DIAGNOSIS — E1165 Type 2 diabetes mellitus with hyperglycemia: Secondary | ICD-10-CM | POA: Diagnosis not present

## 2017-05-26 LAB — POCT GLYCOSYLATED HEMOGLOBIN (HGB A1C): Hemoglobin A1C: 5.7

## 2017-05-26 MED ORDER — GLIPIZIDE 5 MG PO TABS: ORAL_TABLET | ORAL | 3 refills | Status: DC

## 2017-05-26 NOTE — Progress Notes (Signed)
Patient ID: Derrick Brown, male   DOB: October 13, 1951, 66 y.o.   MRN: [id number]   HPI: Derrick Brown is a 66 y.o.-year-old male, returning for follow-up for DM2, dx in ~2007, non-insulin-dependent, uncontrolled, without long term complications. Last visit 3.5 months ago.  He lost 10 lbs since last visit and sugars are much better after starting Invokana!  Last hemoglobin A1c was: Lab Results  Component Value Date   HGBA1C 8.4 02/05/2017   HGBA1C 8.0 11/03/2016   HGBA1C 8.6 (H) 07/28/2016   Pt is on a regimen of: - Metformin 1000 mg 2x a day, with meals >> fishy smell >> Metformin ER 750 mg 2x  aday - Glipizide 5 mg in am and 10 mg before dinner - increased 10/2016 - Januvia 100 mg in a.m. - added 09/2016 - Invokana 100 mg in am - added 01/2017  Pt checks his sugars 2x a day: - am: 1137, 146-191, 213, 380 x1 ([place] at night) >> 144, 159-222, 240 >> 85-127 - 2h after b'fast: n/c >> 198, 200 >> n/c - before lunch: n/c - 2h after lunch: n/c - before dinner: n/c - 2h after dinner: n/c - bedtime: n/c >> 152-239, 260 >> 163-270 >> 79-130, 141, 170 (pizza) - nighttime: n/c No lows. Lowest sugar was 120 >> 137 > 144 >> 68 x1. Highest sugar was 220 >> 380x1 >> 270 >> 170.  Glucometer: Freestyle Lite  Pt's meals are: - Breakfast: oatmeal + blueberries + pecan; cereals; fruit juice; coffee - Lunch: chicken salad or sandwich - late - Dinner: cereal or light dinner - late - Snacks: takes snack boxes to work: yoghurt, veggies, grapes/prunes, chips/cheetos No exercise. Stays active at work.  - No CKD, last BUN/creatinine:  Lab Results  Component Value Date   BUN 11 07/28/2016   BUN 11 05/07/2015   CREATININE 0.68 07/28/2016   CREATININE 0.77 05/07/2015  On Losartan. - last set of lipids: Lab Results  Component Value Date   CHOL 138 07/28/2016   HDL 20.40 (L) 07/28/2016   LDLCALC 75 09/08/2013   LDLDIRECT 75.0 07/28/2016   TRIG 286.0 (H) 07/28/2016   CHOLHDL 7 07/28/2016  On  Lipitor. - last eye exam was: 12/2016 >> Do DR. Gershon Crane. + cataracts. - denies numbness and tingling in his feet.  He also has HL, HTN, GERD.  ROS: Constitutional: + weight loss, no fatigue, no subjective hyperthermia, no subjective hypothermia Eyes: no blurry vision, no xerophthalmia ENT: no sore throat, no nodules palpated in throat, no dysphagia, no odynophagia, no hoarseness Cardiovascular: no CP/no SOB/no palpitations/no leg swelling Respiratory: no cough/no SOB/no wheezing Gastrointestinal: no N/no V/no D/no C/no acid reflux Musculoskeletal: no muscle aches/no joint aches Skin: no rashes, no hair loss Neurological: no tremors/no numbness/no tingling/no dizziness  I reviewed pt's medications, allergies, PMH, social hx, family hx, and changes were documented in the history of present illness. Otherwise, unchanged from my initial visit note.  Past Medical History:  Diagnosis Date  . ACNE ROSACEA 02/21/2009  . COUGH DUE TO ACE INHIBITORS 04/12/2009  . Diabetes mellitus without complication ([place])    type 2  . DYSPHAGIA 05/15/2009  . ESOPHAGITIS, REFLUX 05/13/2009  . FATIGUE 03/14/2010  . Fatty liver   . GERD (gastroesophageal reflux disease)   . HYPERGLYCEMIA 02/21/2009  . HYPERLIPIDEMIA 02/21/2009  . HYPERTENSION NEC 02/21/2009  . INSOMNIA 04/02/2010  . MUSCLE PAIN 05/13/2009  . OBSTRUCTIVE SLEEP APNEA 04/02/2010  . RENAL CALCULUS 02/21/2009  . TEMPOROMANDIBULAR JOINT DISORDER 06/10/2009  .  Unspecified hypertrophic and atrophic condition of skin    Past Surgical History:  Procedure Laterality Date  . COLONOSCOPY    . ESOPHAGOGASTRODUODENOSCOPY    . KIDNEY STONE SURGERY     removal   Social History   Social History  . Marital status: Married    Spouse name: N/A   Social History Main Topics  . Smoking status: Never Smoker  . Smokeless tobacco: Never Used  . Alcohol use 1.7 oz/week    1 burbon  . Drug use: No   Current Outpatient Prescriptions on File Prior to Visit   Medication Sig Dispense Refill  . amLODipine (NORVASC) 10 MG tablet TAKE 1 TABLET (10 MG TOTAL) BY MOUTH DAILY. 90 tablet 3  . amoxicillin-clavulanate (AUGMENTIN) 875-125 MG tablet Take 1 tablet by mouth 2 (two) times daily. 20 tablet 0  . aspirin 81 MG tablet Take 81 mg by mouth daily.      Marland Kitchen atorvastatin (LIPITOR) 20 MG tablet TAKE 1 TABLET EVERY DAY 100 tablet 3  . canagliflozin (INVOKANA) 100 MG TABS tablet Take 1 tablet (100 mg total) by mouth daily before breakfast. 90 tablet 3  . glipiZIDE (GLUCOTROL) 5 MG tablet TAKE 1 TABLET BY MOUTH BEFORE BREAKFAST AND 2 BEFORE YOUR EVENING MEAL 270 tablet 3  . glucose blood (FREESTYLE LITE) test strip USE TO TEST GLUCOSE DAILY OR AS DIRECTED BY YOUR DOCTOR 100 each 1  . hydrochlorothiazide (HYDRODIURIL) 12.5 MG tablet Take 1 tablet (12.5 mg total) by mouth daily as needed. 90 tablet 3  . losartan (COZAAR) 100 MG tablet Take 1 tablet (100 mg total) by mouth daily. 100 tablet 3  . metFORMIN (GLUCOPHAGE-XR) 750 MG 24 hr tablet Take two tablets at dinner (Patient taking differently: Take one tab twice daily) 180 tablet 1  . Multiple Vitamin (MULTIVITAMIN) tablet Take 1 tablet by mouth daily.      . Omega-3 Fatty Acids (FISH OIL) 1000 MG CAPS Take by mouth daily.      . pantoprazole (PROTONIX) 40 MG tablet Take 1 tablet (40 mg total) by mouth daily. 100 tablet 3  . sildenafil (REVATIO) 20 MG tablet Take 1 tablet (20 mg total) by mouth at bedtime as needed. 30 tablet 11  . sitaGLIPtin (JANUVIA) 100 MG tablet Take 1 tablet (100 mg total) by mouth daily. 90 tablet 3  . zolpidem (AMBIEN) 5 MG tablet One half tablet each bedtime when necessary 60 tablet 3   No current facility-administered medications on file prior to visit.    Allergies  Allergen Reactions  . Hctz [Hydrochlorothiazide] Hives, Itching and Rash   Family History  Problem Relation Age of Onset  . Healthy Mother   . Heart attack Father 35  . Healthy Brother   . Colon polyps Sister   .  Heart disease Unknown        family histrory  . Diabetes Paternal Grandmother    PE: BP 130/72 (BP Location: Left Arm, Patient Position: Sitting)   Pulse 75   Ht 6\' 2"  (1.88 m)   Wt 232 lb (105.2 kg)   SpO2 97%   BMI 29.79 kg/m  Wt Readings from Last 3 Encounters:  05/26/17 232 lb (105.2 kg)  05/21/17 233 lb 8 oz (105.9 kg)  02/05/17 241 lb 1.6 oz (109.4 kg)   Constitutional: overweight, in NAD Eyes: PERRLA, EOMI, no exophthalmos ENT: moist mucous membranes, no thyromegaly, no cervical lymphadenopathy Cardiovascular: RRR, No MRG Respiratory: CTA B Gastrointestinal: abdomen soft, NT, ND, BS+ Musculoskeletal: no  deformities, strength intact in all 4 Skin: moist, warm, + stasis dermatitis bilaterally Neurological: no tremor with outstretched hands, DTR normal in all 4  ASSESSMENT: 1. DM2, non-insulin-dependent, uncontrolled, without long term complications, but with hyperglycemia  2. Overweight  PLAN:  1. Patient with long-standing, uncontrolled diabetes, on oral antidiabetic regimen, with metformin, januvia, glipizide, and now Invokana. - no SEs from [place]. He does have worsening increased urination, but not bothersome. - Will need to check his GFR and potassium today. - sugars are impressively improved since last visit and his weight is down 10 lbs. He only has few hyperglycemic spikes after dietary indiscretions, highest 170. However, he may have lows at night after dinner >> will decrease Glipizide with this meal. At next visit, we may even stop Glipizide. - I suggested to:  Patient Instructions  Please continue: - Metformin ER 750 mg 2x a day, with meals - Januvia 100 mg before breakfast - Invokana 100 mg in am.  Please decrease: - Glipizide 5 mg in am and 5 mg before dinner.  Please return in 3 months with your sugar log.   - today, HbA1c is 5.8% (much better) - continue checking sugars at different times of the day - check 1-2x a day, rotating checks -  advised for yearly eye exams >> he is UTD - Return to clinic in 3 mo with sugar log   2. Overweight - congratulated him for the 10 lbs weight loss - continue Invokana - decreasing Glipizide will also help  Office Visit on 05/26/2017  Component Date Value Ref Range Status  . Sodium 05/26/2017 140  135 - 146 mmol/L Final  . Potassium 05/26/2017 4.4  3.5 - 5.3 mmol/L Final  . Chloride 05/26/2017 103  98 - 110 mmol/L Final  . CO2 05/26/2017 21  20 - 32 mmol/L Final   Comment: ** Please note change in reference range(s). **     . Glucose, Bld 05/26/2017 150* 65 - 99 mg/dL Final  . BUN 05/26/2017 15  7 - 25 mg/dL Final  . Creat 05/26/2017 0.72  0.70 - 1.25 mg/dL Final   Comment:   For patients > or = 64 years of age: The upper reference limit for Creatinine is approximately 13% higher for people identified as African-American.     . Calcium 05/26/2017 9.7  8.6 - 10.3 mg/dL Final  . GFR, Est African American 05/26/2017 >89  >=60 mL/min Final  . GFR, Est Non African American 05/26/2017 >89  >=60 mL/min Final  . Hemoglobin A1C 05/26/2017 5.7   Final   Normal, except high Glu.  Philemon Kingdom, MD PhD [facility] Endocrinology

## 2017-05-26 NOTE — Patient Instructions (Addendum)
Please continue: - Metformin ER 750 mg 2x a day, with meals - Januvia 100 mg before breakfast - Invokana 100 mg in am.  Please decrease: - Glipizide 5 mg in am and 5 mg before dinner.  Please return in 3 months with your sugar log.

## 2017-05-27 LAB — BASIC METABOLIC PANEL WITH GFR
BUN: 15 mg/dL (ref 7–25)
CALCIUM: 9.7 mg/dL (ref 8.6–10.3)
CO2: 21 mmol/L (ref 20–32)
Chloride: 103 mmol/L (ref 98–110)
Creat: 0.72 mg/dL (ref 0.70–1.25)
GFR, Est Non African American: >89 mL/min (ref >=60)
GLUCOSE: 150 mg/dL — AB (ref 65–99)
Potassium: 4.4 mmol/L (ref 3.5–5.3)
Sodium: 140 mmol/L (ref 135–146)

## 2017-06-18 ENCOUNTER — Ambulatory Visit: Payer: BLUE CROSS/BLUE SHIELD | Admitting: Podiatry

## 2017-06-30 ENCOUNTER — Encounter: Payer: Self-pay | Admitting: Family Medicine

## 2017-06-30 ENCOUNTER — Ambulatory Visit (INDEPENDENT_AMBULATORY_CARE_PROVIDER_SITE_OTHER): Payer: BLUE CROSS/BLUE SHIELD | Admitting: Family Medicine

## 2017-06-30 VITALS — BP 124/78 | HR 74 | Temp 97.8°F | Ht 74.0 in | Wt 228.0 lb

## 2017-06-30 DIAGNOSIS — Z23 Encounter for immunization: Secondary | ICD-10-CM | POA: Diagnosis not present

## 2017-06-30 DIAGNOSIS — Z125 Encounter for screening for malignant neoplasm of prostate: Secondary | ICD-10-CM

## 2017-06-30 DIAGNOSIS — E1165 Type 2 diabetes mellitus with hyperglycemia: Secondary | ICD-10-CM

## 2017-06-30 DIAGNOSIS — Z0000 Encounter for general adult medical examination without abnormal findings: Secondary | ICD-10-CM

## 2017-06-30 DIAGNOSIS — E7801 Familial hypercholesterolemia: Secondary | ICD-10-CM | POA: Diagnosis not present

## 2017-06-30 DIAGNOSIS — I10 Essential (primary) hypertension: Secondary | ICD-10-CM

## 2017-06-30 DIAGNOSIS — F5101 Primary insomnia: Secondary | ICD-10-CM

## 2017-06-30 LAB — CBC WITH DIFFERENTIAL/PLATELET
BASOS ABS: 0.1 10*3/uL (ref 0.0–0.1)
BASOS PCT: 1.2 % (ref 0.0–3.0)
EOS ABS: 0.3 10*3/uL (ref 0.0–0.7)
Eosinophils Relative: 4.4 % (ref 0.0–5.0)
HEMATOCRIT: 47.7 % (ref 39.0–52.0)
HEMOGLOBIN: 16.1 g/dL (ref 13.0–17.0)
LYMPHS PCT: 29.1 % (ref 12.0–46.0)
Lymphs Abs: 2 10*3/uL (ref 0.7–4.0)
MCHC: 33.7 g/dL (ref 30.0–36.0)
MCV: 90 fl (ref 78.0–100.0)
MONOS PCT: 10.6 % (ref 3.0–12.0)
Monocytes Absolute: 0.7 10*3/uL (ref 0.1–1.0)
NEUTROS ABS: 3.8 10*3/uL (ref 1.4–7.7)
Neutrophils Relative %: 54.7 % (ref 43.0–77.0)
Platelets: 263 10*3/uL (ref 150.0–400.0)
RBC: 5.3 Mil/uL (ref 4.22–5.81)
RDW: 14.7 % (ref 11.5–15.5)
WBC: 7 10*3/uL (ref 4.0–10.5)

## 2017-06-30 LAB — HEPATIC FUNCTION PANEL
ALT: 48 U/L (ref 0–53)
AST: 24 U/L (ref 0–37)
Albumin: 4.5 g/dL (ref 3.5–5.2)
Alkaline Phosphatase: 70 U/L (ref 39–117)
BILIRUBIN TOTAL: 0.5 mg/dL (ref 0.2–1.2)
Bilirubin, Direct: 0.1 mg/dL (ref 0.0–0.3)
Total Protein: 6.6 g/dL (ref 6.0–8.3)

## 2017-06-30 LAB — LIPID PANEL
CHOL/HDL RATIO: 4
CHOLESTEROL: 149 mg/dL (ref 0–200)
HDL: 33.5 mg/dL — ABNORMAL LOW (ref >39.00)
NONHDL: 115.04
TRIGLYCERIDES: 217 mg/dL — AB (ref 0.0–149.0)
VLDL: 43.4 mg/dL — ABNORMAL HIGH (ref 0.0–40.0)

## 2017-06-30 LAB — LDL CHOLESTEROL, DIRECT: LDL DIRECT: 87 mg/dL

## 2017-06-30 LAB — MICROALBUMIN / CREATININE URINE RATIO
CREATININE, U: 59.3 mg/dL
Microalb Creat Ratio: 1.2 mg/g (ref 0.0–30.0)

## 2017-06-30 LAB — TSH: TSH: 3.15 u[IU]/mL (ref 0.35–4.50)

## 2017-06-30 LAB — PSA: PSA: 1.26 ng/mL (ref 0.10–4.00)

## 2017-06-30 MED ORDER — LOSARTAN POTASSIUM 100 MG PO TABS: 100.0000 mg | ORAL_TABLET | Freq: Every day | ORAL | 3 refills | Status: DC

## 2017-06-30 MED ORDER — ZOLPIDEM TARTRATE 5 MG PO TABS: ORAL_TABLET | ORAL | 3 refills | Status: DC

## 2017-06-30 MED ORDER — PANTOPRAZOLE SODIUM 40 MG PO TBEC: 40.0000 mg | DELAYED_RELEASE_TABLET | Freq: Every day | ORAL | 3 refills | Status: DC

## 2017-06-30 MED ORDER — ATORVASTATIN CALCIUM 20 MG PO TABS: ORAL_TABLET | ORAL | 3 refills | Status: DC

## 2017-06-30 MED ORDER — SILDENAFIL CITRATE 20 MG PO TABS: 20.0000 mg | ORAL_TABLET | Freq: Every evening | ORAL | 11 refills | Status: DC | PRN

## 2017-06-30 MED ORDER — AMLODIPINE BESYLATE 10 MG PO TABS: ORAL_TABLET | ORAL | 3 refills | Status: DC

## 2017-06-30 NOTE — Patient Instructions (Signed)
Continue current medications  Continue to work heart on your diet and exercise daily  Follow-up in one year sooner if any problems  Follow-up with endocrinology as outlined.  Consult your dermatologist about the 2 lesions on your right forehead  Call your insurance company about your shingles vaccine

## 2017-06-30 NOTE — Progress Notes (Signed)
Thank Derrick Brown is a 66 year old married male nonsmoker who comes in today for general physical examination because of a history of hypertension, hyperlipidemia, diabetes type 2, reflux esophagitis, erectile dysfunction, and sleep dysfunction  For potentially takes Norvasc 10 mg along with Cozaar 100 mg daily. BP here today 124/78 at goal  He takes Lipitor 20 mg daily for hyperlipidemia along with an aspirin tablet last LDL November 2017 was 75  He takes Glucotrol 5 mg twice a day, metformin 750 mg 2 tabs at dinner, Invokana 100 mg before breakfast, Januvia 100 mg in the morning, he's been seen by endocrinology because of his A1c was so out-of-control. Recent A1c in 05/26/2017 was 5.7  He continues to struggle with his weight and diet. He has been more compliant last month indeed is lost 4 pounds. He works at a random off [employer] and walks at work but does not do anything other than above that for exercise.  He takes protonic screening milligrams daily for chronic reflux esophagitis  He uses generic Viagra 20 mg when necessary for ED  He takes Ambien 5 mg 1 tablet daily at bedtime for sleep dysfunction. We've tried all medications were fed cognitive behavioral therapy nothing works except the [place]. He declines any further evaluation.  Vaccinations seasonal flu shot today tetanus booster 2017. He was given-year-old shingles vaccine. Advised to call [facility] and find a ring get the new shingles vaccine  14 point review of systems reviewed and otherwise negative  He gets routine eye care by his ophthalmologist yearly. No evidence of retinopathy. His ophthalmologist is Dr. Gershon Crane. He gets regular dental care. His last colonoscopy 2014 showed some polyps. He's due to go back to [facility] for follow-up colonoscopy.  S.BP 124/78 (BP Location: Left Arm, Patient Position: Sitting, Cuff Size: Normal)   Pulse 74   Temp 97.8 F (36.6 C) (Oral)   Ht 6\' 2"  (1.88 m)   Wt 228  lb (103.4 kg)   BMI 29.27 kg/m  He enjoys well-developed well-nourished male no acute distress examination HEENT were negative neck was supple thyroid is not enlarged no carotid bruits cardiopulmonary exam normal abdominal exam normal genitalia normal circumcised male rectum normal. Guaiac negative prostate smooth nonnodular 1+ BPH  Extremities normal skin normal peripheral pulses normal except for 2 lesions on his right forehead. The red irritated nonhealing. Advised to see his dermatologist.  #1 .diabetes type 2 under excellent control ........ continue current therapy follow-up by endocrinology as outlined  #2 hypertension ago ......... continue current therapy  #3 hyperlipidemia ......... continue Lipitor and aspirin check lipid panel  #4 .erectile dysfunction continue generic Viagra  #5 chronic reflux esophagitis .... Advised by gastroenterology to take his proton X. He's cut it down to every other day and seems to be doing well.  #6 sleep dysfunction .......... continue Ambien v

## 2017-07-02 ENCOUNTER — Encounter: Payer: Self-pay | Admitting: Family Medicine

## 2017-07-07 ENCOUNTER — Telehealth: Payer: Self-pay

## 2017-07-07 ENCOUNTER — Other Ambulatory Visit: Payer: Self-pay

## 2017-07-07 ENCOUNTER — Other Ambulatory Visit: Payer: Self-pay | Admitting: Family Medicine

## 2017-07-07 MED ORDER — SCOPOLAMINE 1 MG/3DAYS TD PT72: 1.0000 | MEDICATED_PATCH | TRANSDERMAL | 0 refills | Status: DC

## 2017-07-07 NOTE — Telephone Encounter (Signed)
Pt Transderm Scop patch was approved and escribed to pt pharmacy, pt is aware.

## 2017-07-09 ENCOUNTER — Encounter: Payer: Self-pay | Admitting: Family Medicine

## 2017-07-13 DIAGNOSIS — L570 Actinic keratosis: Secondary | ICD-10-CM | POA: Diagnosis not present

## 2017-07-13 DIAGNOSIS — L720 Epidermal cyst: Secondary | ICD-10-CM | POA: Diagnosis not present

## 2017-07-13 DIAGNOSIS — L821 Other seborrheic keratosis: Secondary | ICD-10-CM | POA: Diagnosis not present

## 2017-07-17 ENCOUNTER — Other Ambulatory Visit: Payer: Self-pay | Admitting: Family Medicine

## 2017-07-20 ENCOUNTER — Encounter: Payer: Self-pay | Admitting: Family Medicine

## 2017-07-24 ENCOUNTER — Other Ambulatory Visit: Payer: Self-pay | Admitting: Family Medicine

## 2017-07-28 ENCOUNTER — Telehealth: Payer: Self-pay

## 2017-07-28 ENCOUNTER — Other Ambulatory Visit: Payer: Self-pay | Admitting: Family Medicine

## 2017-07-28 DIAGNOSIS — F5101 Primary insomnia: Secondary | ICD-10-CM

## 2017-07-28 DIAGNOSIS — Z0000 Encounter for general adult medical examination without abnormal findings: Secondary | ICD-10-CM

## 2017-07-28 MED ORDER — SILDENAFIL CITRATE 20 MG PO TABS: 20.0000 mg | ORAL_TABLET | Freq: Every evening | ORAL | 11 refills | Status: DC | PRN

## 2017-07-28 MED ORDER — ZOLPIDEM TARTRATE 5 MG PO TABS: ORAL_TABLET | ORAL | 3 refills | Status: DC

## 2017-07-28 NOTE — Progress Notes (Unsigned)
His

## 2017-07-28 NOTE — Telephone Encounter (Signed)
Rx for Ambien and Revatio have been signed and are ready for pick up at the front desk. Pt is aware.

## 2017-09-17 ENCOUNTER — Ambulatory Visit (INDEPENDENT_AMBULATORY_CARE_PROVIDER_SITE_OTHER): Payer: BLUE CROSS/BLUE SHIELD | Admitting: Internal Medicine

## 2017-09-17 ENCOUNTER — Encounter: Payer: Self-pay | Admitting: Internal Medicine

## 2017-09-17 VITALS — BP 130/82 | HR 81 | Wt 229.8 lb

## 2017-09-17 DIAGNOSIS — E1165 Type 2 diabetes mellitus with hyperglycemia: Secondary | ICD-10-CM | POA: Diagnosis not present

## 2017-09-17 DIAGNOSIS — E663 Overweight: Secondary | ICD-10-CM

## 2017-09-17 DIAGNOSIS — E7801 Familial hypercholesterolemia: Secondary | ICD-10-CM

## 2017-09-17 LAB — POCT GLYCOSYLATED HEMOGLOBIN (HGB A1C): HEMOGLOBIN A1C: 5.9

## 2017-09-17 MED ORDER — ATORVASTATIN CALCIUM 20 MG PO TABS: ORAL_TABLET | ORAL | 3 refills | Status: DC

## 2017-09-17 NOTE — Addendum Note (Signed)
Addended by: Drucilla Schmidt on: 09/17/2017 09:14 AM   Modules accepted: Orders

## 2017-09-17 NOTE — Patient Instructions (Addendum)
Please continue: - Metformin ER 750 mg 2x a day, with meals - Januvia 100 mg before breakfast - Invokana 100 mg in am.  Please continue: - Glipizide 5 mg in am but only use the dinnertime dose for a large meal.  Please return in 4 months with your sugar log.

## 2017-09-17 NOTE — Progress Notes (Signed)
Patient ID: Derrick Brown, male   DOB: 1951/09/14, 66 y.o.   MRN: [id number]   HPI: Derrick Brown is a 66 y.o.-year-old male, returning for follow-up for DM2, dx in ~2007, non-insulin-dependent, uncontrolled, without long term complications. Last visit 3.5 months ago.  Last hemoglobin A1c was: Lab Results  Component Value Date   HGBA1C 5.7 05/26/2017   HGBA1C 8.4 02/05/2017   HGBA1C 8.0 11/03/2016   Pt is on a regimen of: - Metformin 1000 mg 2x a day, with meals >> fishy smell >> Metformin ER 750 mg 2x  aday - Glipizide 5 mg in am and 5 mg before dinner - Januvia 100 mg in a.m. - added 09/2016 - Invokana 100 mg in am - added 01/2017  Pt checks his sugars 2x a day: - am: 144, 159-222, 240 >> 85-127 >> 81-123 - 2h after b'fast: n/c >> 198, 200 >> n/c - before lunch: n/c - 2h after lunch: n/c - before dinner: n/c - 2h after dinner: n/c - bedtime: 163-270 >> 79-130, 141, 170 (pizza) >> 86-131, 173, 183 - nighttime: n/c Lowest sugar was 68 x1 >> 81 Highest sugar was 170 >> 183   Glucometer: Freestyle Lite  Pt's meals are: - Breakfast: oatmeal + blueberries + pecan; cereals; fruit juice; coffee - Lunch: chicken salad or sandwich - late - Dinner: cereal or light dinner - late - Snacks: takes snack boxes to work: yoghurt, veggies, grapes/prunes, chips/cheetos No formal exercise but stays active at work  -No CKD, last BUN/creatinine:  Lab Results  Component Value Date   BUN 15 05/26/2017   BUN 11 07/28/2016   CREATININE 0.72 05/26/2017   CREATININE 0.68 07/28/2016  On losartan. Lab Results  Component Value Date   MICRALBCREAT 1.2 06/30/2017   MICRALBCREAT 1.5 07/28/2016   MICRALBCREAT 1.0 10/18/2014   MICRALBCREAT 1.1 04/23/2014   MICRALBCREAT 0.8 09/08/2013   MICRALBCREAT 0.9 03/07/2010   MICRALBCREAT 11.5 02/14/2009   -+ HL; last set of lipids: Lab Results  Component Value Date   CHOL 149 06/30/2017   HDL 33.50 (L) 06/30/2017   LDLCALC 75 09/08/2013   LDLDIRECT 87.0  06/30/2017   TRIG 217.0 (H) 06/30/2017   CHOLHDL 4 06/30/2017  On Lipitor 20 mg qod. - last eye exam was: 12/2016: No DR. Dr. Gershon Crane.  He does have cataracts. - No numbness and tingling in his feet.  He also has HL, HTN, GERD.  ROS: Constitutional: no weight gain/no weight loss, no fatigue, no subjective hyperthermia, no subjective hypothermia Eyes: no blurry vision, no xerophthalmia ENT: no sore throat, no nodules palpated in throat, no dysphagia, no odynophagia, no hoarseness Cardiovascular: no CP/no SOB/no palpitations/no leg swelling Respiratory: no cough/no SOB/no wheezing Gastrointestinal: no N/no V/no D/no C/no acid reflux Musculoskeletal: no muscle aches/no joint aches Skin: no rashes, no hair loss Neurological: no tremors/no numbness/no tingling/no dizziness  I reviewed pt's medications, allergies, PMH, social hx, family hx, and changes were documented in the history of present illness. Otherwise, unchanged from my initial visit note.  Past Medical History:  Diagnosis Date  . ACNE ROSACEA 02/21/2009  . COUGH DUE TO ACE INHIBITORS 04/12/2009  . Diabetes mellitus without complication ([place])    type 2  . DYSPHAGIA 05/15/2009  . ESOPHAGITIS, REFLUX 05/13/2009  . FATIGUE 03/14/2010  . Fatty liver   . GERD (gastroesophageal reflux disease)   . HYPERGLYCEMIA 02/21/2009  . HYPERLIPIDEMIA 02/21/2009  . HYPERTENSION NEC 02/21/2009  . INSOMNIA 04/02/2010  . MUSCLE PAIN 05/13/2009  .  OBSTRUCTIVE SLEEP APNEA 04/02/2010  . RENAL CALCULUS 02/21/2009  . TEMPOROMANDIBULAR JOINT DISORDER 06/10/2009  . Unspecified hypertrophic and atrophic condition of skin    Past Surgical History:  Procedure Laterality Date  . COLONOSCOPY    . ESOPHAGOGASTRODUODENOSCOPY    . KIDNEY STONE SURGERY     removal   Social History   Social History  . Marital status: Married    Spouse name: N/A   Social History Main Topics  . Smoking status: Never Smoker  . Smokeless tobacco: Never Used  . Alcohol use 1.7  oz/week    1 burbon  . Drug use: No   Current Outpatient Medications on File Prior to Visit  Medication Sig Dispense Refill  . amLODipine (NORVASC) 10 MG tablet TAKE 1 TABLET (10 MG TOTAL) BY MOUTH DAILY. 90 tablet 3  . aspirin 81 MG tablet Take 81 mg by mouth daily.      Marland Kitchen atorvastatin (LIPITOR) 20 MG tablet TAKE 1 TABLET EVERY DAY 100 tablet 3  . canagliflozin (INVOKANA) 100 MG TABS tablet Take 1 tablet (100 mg total) by mouth daily before breakfast. 90 tablet 3  . FREESTYLE LITE test strip USE TO TEST GLUCOSE DAILY OR AS DIRECTED BY YOUR DOCTOR (DX E11.9) 100 each 4  . glipiZIDE (GLUCOTROL) 5 MG tablet TAKE 1 TABLET BY MOUTH BEFORE BREAKFAST AND 1 BEFORE YOUR EVENING MEAL 180 tablet 3  . glucose blood (FREESTYLE LITE) test strip USE TO TEST GLUCOSE DAILY OR AS DIRECTED BY YOUR DOCTOR 100 each 1  . losartan (COZAAR) 100 MG tablet Take 1 tablet (100 mg total) by mouth daily. 100 tablet 3  . losartan (COZAAR) 100 MG tablet TAKE 1 TABLET EVERY DAY 100 tablet 2  . metFORMIN (GLUCOPHAGE-XR) 750 MG 24 hr tablet Take two tablets at dinner (Patient taking differently: Take one tab twice daily) 180 tablet 1  . Multiple Vitamin (MULTIVITAMIN) tablet Take 1 tablet by mouth daily.      . Omega-3 Fatty Acids (FISH OIL) 1000 MG CAPS Take by mouth daily.      . pantoprazole (PROTONIX) 40 MG tablet Take 1 tablet (40 mg total) by mouth daily. 100 tablet 3  . scopolamine (TRANSDERM-SCOP, 1.5 MG,) 1 MG/3DAYS Place 1 patch (1.5 mg total) onto the skin every 3 (three) days. Place the first patch 12 hours before going up to the sea 3 patch 0  . sildenafil (REVATIO) 20 MG tablet Take 1 tablet (20 mg total) by mouth at bedtime as needed. 30 tablet 11  . sitaGLIPtin (JANUVIA) 100 MG tablet Take 1 tablet (100 mg total) by mouth daily. 90 tablet 3  . zolpidem (AMBIEN) 5 MG tablet One  tablet each bedtime when necessary 90 tablet 3   No current facility-administered medications on file prior to visit.    Allergies   Allergen Reactions  . Hctz [Hydrochlorothiazide] Hives, Itching and Rash   Family History  Problem Relation Age of Onset  . Healthy Mother   . Heart attack Father 54  . Healthy Brother   . Colon polyps Sister   . Heart disease Unknown        family histrory  . Diabetes Paternal Grandmother    PE: BP 130/82   Pulse 81   Wt 229 lb 12.8 oz (104.2 kg)   SpO2 98%   BMI 29.50 kg/m  Wt Readings from Last 3 Encounters:  09/17/17 229 lb 12.8 oz (104.2 kg)  06/30/17 228 lb (103.4 kg)  05/26/17 232 lb (105.2  kg)   Constitutional: overweight, in NAD Eyes: PERRLA, EOMI, no exophthalmos ENT: moist mucous membranes, no thyromegaly, no cervical lymphadenopathy Cardiovascular: RRR, No MRG Respiratory: CTA B Gastrointestinal: abdomen soft, NT, ND, BS+ Musculoskeletal: no deformities, strength intact in all 4 Skin: moist, warm, + stasis dermatitis bilaterally Neurological: no tremor with outstretched hands, DTR normal in all 4  ASSESSMENT: 1. DM2, non-insulin-dependent, uncontrolled, without long term complications, but with hyperglycemia  2. Overweight  3. HL  PLAN:  1. Patient with long-standing, previously uncontrolled diabetes, with much better control after starting Invokana.  He also continues metformin, Januvia, and glipizide.  we did decrease his glipizide dose before dinner at last visit. - at this visit, sugars are still great, with mostly 80-100 values after dinner/bedtime >> will stop Glipizide at dinnertime except if he has a large meal - I suggested to:  Patient Instructions  Please continue: - Metformin ER 750 mg 2x a day, with meals - Januvia 100 mg before breakfast - Invokana 100 mg in am.  Please continue: - Glipizide 5 mg in am but only use the dinnertime dose for a large meal.  Please return in 4 months with your sugar log.   - today, HbA1c is 5.9% (EXCELLENT) - continue checking sugars at different times of the day - check 1x a day, rotating checks -  advised for yearly eye exams >> he is UTD - UTD with flu shot - Return to clinic in 4 mo with sugar log    2. Overweight - He lost a little more weight since last visit, despite 2/2 TxGiving - He did lose 10 pounds before last visit on Invokana - continue to watch diet  3. HL - continue Lipitor  - he takes this qod - no SEs - latest LDL at goal  Philemon Kingdom, MD PhD [facility] Endocrinology

## 2017-11-18 ENCOUNTER — Encounter: Payer: Self-pay | Admitting: Internal Medicine

## 2017-11-24 ENCOUNTER — Encounter: Payer: Self-pay | Admitting: Internal Medicine

## 2017-12-04 ENCOUNTER — Other Ambulatory Visit: Payer: Self-pay | Admitting: Internal Medicine

## 2017-12-13 ENCOUNTER — Telehealth: Payer: Self-pay | Admitting: Family Medicine

## 2017-12-13 NOTE — Telephone Encounter (Signed)
Copied from [place] [id number]. Topic: General - Other >> Dec 13, 2017  2:38 PM Darl Householder, RMA wrote: Reason for CRM: Patient is requesting a call back from [place] concerning previous appt

## 2017-12-14 NOTE — Telephone Encounter (Signed)
Patient was called back by Apolonio Schneiders.

## 2017-12-28 ENCOUNTER — Encounter: Payer: Self-pay | Admitting: *Deleted

## 2017-12-30 ENCOUNTER — Other Ambulatory Visit: Payer: Self-pay | Admitting: Internal Medicine

## 2017-12-31 DIAGNOSIS — L718 Other rosacea: Secondary | ICD-10-CM | POA: Diagnosis not present

## 2018-01-03 ENCOUNTER — Ambulatory Visit: Payer: BLUE CROSS/BLUE SHIELD | Admitting: Internal Medicine

## 2018-01-14 ENCOUNTER — Ambulatory Visit (AMBULATORY_SURGERY_CENTER): Payer: Self-pay

## 2018-01-14 ENCOUNTER — Other Ambulatory Visit: Payer: Self-pay

## 2018-01-14 VITALS — Ht 74.0 in | Wt 234.4 lb

## 2018-01-14 DIAGNOSIS — Z86010 Personal history of colonic polyps: Secondary | ICD-10-CM

## 2018-01-14 NOTE — Progress Notes (Signed)
Denies allergies to eggs or soy products. Denies complication of anesthesia or sedation. Denies use of weight loss medication. Denies use of O2.   Emmi instructions declined.  

## 2018-01-26 ENCOUNTER — Ambulatory Visit (AMBULATORY_SURGERY_CENTER): Payer: BLUE CROSS/BLUE SHIELD | Admitting: Internal Medicine

## 2018-01-26 ENCOUNTER — Other Ambulatory Visit: Payer: Self-pay

## 2018-01-26 ENCOUNTER — Encounter: Payer: Self-pay | Admitting: Internal Medicine

## 2018-01-26 VITALS — BP 142/78 | HR 69 | Temp 97.1°F | Resp 13 | Ht 74.0 in | Wt 234.0 lb

## 2018-01-26 DIAGNOSIS — Z86010 Personal history of colonic polyps: Secondary | ICD-10-CM

## 2018-01-26 DIAGNOSIS — D123 Benign neoplasm of transverse colon: Secondary | ICD-10-CM | POA: Diagnosis not present

## 2018-01-26 DIAGNOSIS — D120 Benign neoplasm of cecum: Secondary | ICD-10-CM

## 2018-01-26 MED ORDER — SODIUM CHLORIDE 0.9 % IV SOLN: 500.0000 mL | Freq: Once | INTRAVENOUS | Status: DC

## 2018-01-26 NOTE — Progress Notes (Signed)
Pt's states no medical or surgical changes since previsit or office visit. 

## 2018-01-26 NOTE — Progress Notes (Signed)
Report given to PACU, vss 

## 2018-01-26 NOTE — Op Note (Signed)
[facility] Patient Name: Derrick Brown Procedure Date: 01/26/2018 10:18 AM MRN: [id number] Endoscopist: Gatha Mayer , MD Age: 67 Referring MD:  Date of Birth: 04/23/1951 Gender: Male Account #: [id number] Procedure:                Colonoscopy Indications:              Surveillance: Personal history of adenomatous                            polyps on last colonoscopy 5 years ago Medicines:                Propofol per Anesthesia, Monitored Anesthesia Care Procedure:                Pre-Anesthesia Assessment:                           - Prior to the procedure, a History and Physical                            was performed, and patient medications and                            allergies were reviewed. The patient's tolerance of                            previous anesthesia was also reviewed. The risks                            and benefits of the procedure and the sedation                            options and risks were discussed with the patient.                            All questions were answered, and informed consent                            was obtained. Prior Anticoagulants: The patient has                            taken no previous anticoagulant or antiplatelet                            agents. ASA Grade Assessment: II - A patient with                            mild systemic disease. After reviewing the risks                            and benefits, the patient was deemed in                            satisfactory condition to undergo the procedure.  After obtaining informed consent, the colonoscope                            was passed under direct vision. Throughout the                            procedure, the patient's blood pressure, pulse, and                            oxygen saturations were monitored continuously. The                            Colonoscope was introduced through the anus and   advanced to the the cecum, identified by                            appendiceal orifice and ileocecal valve. The                            colonoscopy was performed without difficulty. The                            patient tolerated the procedure well. The quality                            of the bowel preparation was excellent. The                            ileocecal valve, appendiceal orifice, and rectum                            were photographed. The bowel preparation used was                            Miralax. Scope In: 10:26:15 AM Scope Out: 10:40:57 AM Scope Withdrawal Time: 0 hours 12 minutes 7 seconds  Total Procedure Duration: 0 hours 14 minutes 42 seconds  Findings:                 The perianal and digital rectal examinations were                            normal. Pertinent negatives include normal prostate                            (size, shape, and consistency).                           Two sessile polyps were found in the transverse                            colon and cecum. The polyps were diminutive in                            size. These polyps were removed with a cold snare.  Resection and retrieval were complete. Verification                            of patient identification for the specimen was                            done. Estimated blood loss was minimal.                           A few small-mouthed diverticula were found in the                            sigmoid colon.                           The exam was otherwise without abnormality on                            direct and retroflexion views. Complications:            No immediate complications. Estimated Blood Loss:     Estimated blood loss was minimal. Impression:               - Two diminutive polyps in the transverse colon and                            in the cecum, removed with a cold snare. Resected                            and retrieved.                            - Diverticulosis in the sigmoid colon.                           - The examination was otherwise normal on direct                            and retroflexion views.                           - Personal history of colonic polyp - adenoma 2014. Recommendation:           - Patient has a contact number available for                            emergencies. The signs and symptoms of potential                            delayed complications were discussed with the                            patient. Return to normal activities tomorrow.                            Written discharge instructions were provided to  the                            patient.                           - Resume previous diet.                           - Continue present medications.                           - Repeat colonoscopy is recommended for                            surveillance. The colonoscopy date will be                            determined after pathology results from today's                            exam become available for review. Gatha Mayer, MD 01/26/2018 10:48:07 AM This report has been signed electronically.

## 2018-01-26 NOTE — Patient Instructions (Addendum)
I found and removed 2 tiny polyps that look benign.  I will let you know pathology results and when to have another routine colonoscopy by mail and/or My Chart. Should be 5 years again.  You also have a condition called diverticulosis - common and not usually a problem. Please read the handout provided.  I appreciate the opportunity to care for you. Gatha Mayer, MD, FACG YOU HAD AN ENDOSCOPIC PROCEDURE TODAY AT [facility]:   Refer to the procedure report that was given to you for any specific questions about what was found during the examination.  If the procedure report does not answer your questions, please call your gastroenterologist to clarify.  If you requested that your care partner not be given the details of your procedure findings, then the procedure report has been included in a sealed envelope for you to review at your convenience later.  YOU SHOULD EXPECT: Some feelings of bloating in the abdomen. Passage of more gas than usual.  Walking can help get rid of the air that was put into your GI tract during the procedure and reduce the bloating. If you had a lower endoscopy (such as a colonoscopy or flexible sigmoidoscopy) you may notice spotting of blood in your stool or on the toilet paper. If you underwent a bowel prep for your procedure, you may not have a normal bowel movement for a few days.  Please Note:  You might notice some irritation and congestion in your nose or some drainage.  This is from the oxygen used during your procedure.  There is no need for concern and it should clear up in a day or so.  SYMPTOMS TO REPORT IMMEDIATELY:   Following lower endoscopy (colonoscopy or flexible sigmoidoscopy):  Excessive amounts of blood in the stool  Significant tenderness or worsening of abdominal pains  Swelling of the abdomen that is new, acute  Fever of 100F or higher   Following upper endoscopy (EGD)  Vomiting of blood or coffee ground  material  New chest pain or pain under the shoulder blades  Painful or persistently difficult swallowing  New shortness of breath  Fever of 100F or higher  Black, tarry-looking stools  For urgent or emergent issues, a gastroenterologist can be reached at any hour by calling [phone].   DIET:  We do recommend a small meal at first, but then you may proceed to your regular diet.  Drink plenty of fluids but you should avoid alcoholic beverages for 24 hours.  ACTIVITY:  You should plan to take it easy for the rest of today and you should NOT DRIVE or use heavy machinery until tomorrow (because of the sedation medicines used during the test).    FOLLOW UP: Our staff will call the number listed on your records the next business day following your procedure to check on you and address any questions or concerns that you may have regarding the information given to you following your procedure. If we do not reach you, we will leave a message.  However, if you are feeling well and you are not experiencing any problems, there is no need to return our call.  We will assume that you have returned to your regular daily activities without incident.  If any biopsies were taken you will be contacted by phone or by letter within the next 1-3 weeks.  Please call us at [phone] if you have not heard about the biopsies in 3 weeks.  SIGNATURES/CONFIDENTIALITY: You and/or your care partner have signed paperwork which will be entered into your electronic medical record.  These signatures attest to the fact that that the information above on your After Visit Summary has been reviewed and is understood.  Full responsibility of the confidentiality of this discharge information lies with you and/or your care-partner.  Diverticulosis and polyp information given.

## 2018-01-26 NOTE — Progress Notes (Signed)
Called to room to assist during endoscopic procedure.  Patient ID and intended procedure confirmed with present staff. Received instructions for my participation in the procedure from the performing physician.  

## 2018-01-27 ENCOUNTER — Telehealth: Payer: Self-pay

## 2018-01-27 NOTE — Telephone Encounter (Signed)
  Follow up Call-  Call back number 01/26/2018  Post procedure Call Back phone  # [phone]  Permission to leave phone message Yes  Some recent data might be hidden     Patient questions:  Do you have a fever, pain , or abdominal swelling? No. Pain Score  0 *  Have you tolerated food without any problems? Yes.    Have you been able to return to your normal activities? Yes.    Do you have any questions about your discharge instructions: Diet   No. Medications  No. Follow up visit  No.  Do you have questions or concerns about your Care? No.  Actions: * If pain score is 4 or above: No action needed, pain <4.

## 2018-02-01 ENCOUNTER — Encounter: Payer: Self-pay | Admitting: Internal Medicine

## 2018-02-02 ENCOUNTER — Encounter: Payer: Self-pay | Admitting: Internal Medicine

## 2018-02-02 NOTE — Progress Notes (Signed)
Two diminutive adenomas Recall 2024 My Chart letter

## 2018-02-10 DIAGNOSIS — H2513 Age-related nuclear cataract, bilateral: Secondary | ICD-10-CM | POA: Diagnosis not present

## 2018-02-10 DIAGNOSIS — E119 Type 2 diabetes mellitus without complications: Secondary | ICD-10-CM | POA: Diagnosis not present

## 2018-02-10 DIAGNOSIS — H52201 Unspecified astigmatism, right eye: Secondary | ICD-10-CM | POA: Diagnosis not present

## 2018-02-10 DIAGNOSIS — H5201 Hypermetropia, right eye: Secondary | ICD-10-CM | POA: Diagnosis not present

## 2018-02-10 DIAGNOSIS — H5212 Myopia, left eye: Secondary | ICD-10-CM | POA: Diagnosis not present

## 2018-02-10 DIAGNOSIS — H524 Presbyopia: Secondary | ICD-10-CM | POA: Diagnosis not present

## 2018-02-10 LAB — HM DIABETES EYE EXAM

## 2018-02-22 ENCOUNTER — Encounter: Payer: Self-pay | Admitting: Family Medicine

## 2018-03-11 ENCOUNTER — Encounter: Payer: Self-pay | Admitting: Internal Medicine

## 2018-03-11 ENCOUNTER — Ambulatory Visit: Payer: BLUE CROSS/BLUE SHIELD | Admitting: Internal Medicine

## 2018-03-11 VITALS — BP 138/74 | HR 73 | Ht 74.0 in | Wt 233.4 lb

## 2018-03-11 DIAGNOSIS — E1165 Type 2 diabetes mellitus with hyperglycemia: Secondary | ICD-10-CM | POA: Diagnosis not present

## 2018-03-11 DIAGNOSIS — E663 Overweight: Secondary | ICD-10-CM | POA: Diagnosis not present

## 2018-03-11 DIAGNOSIS — E785 Hyperlipidemia, unspecified: Secondary | ICD-10-CM | POA: Diagnosis not present

## 2018-03-11 LAB — POCT GLYCOSYLATED HEMOGLOBIN (HGB A1C): HEMOGLOBIN A1C: 5.8 % — AB (ref 4.0–5.6)

## 2018-03-11 MED ORDER — METFORMIN HCL ER 750 MG PO TB24: ORAL_TABLET | ORAL | 3 refills | Status: DC

## 2018-03-11 NOTE — Patient Instructions (Addendum)
Please continue: - Metformin ER 750 mg 2x a day, with meals - Januvia 100 mg before breakfast - Invokana 100 mg in am.  Please try to stop: - Glipizide but only use it for larger meals  Please return in 6 months with your sugar log.

## 2018-03-11 NOTE — Progress Notes (Signed)
Patient ID: DANIL WEDGE, male   DOB: 20-Jun-1951, 67 y.o.   MRN: [id number]   HPI: ANIBAL QUINBY is a 67 y.o.-year-old male, returning for follow-up for DM2, dx in ~2007, non-insulin-dependent, uncontrolled, without long term complications. Last visit 6 months ago.  Last hemoglobin A1c was: Lab Results  Component Value Date   HGBA1C 5.9 09/17/2017   HGBA1C 5.7 05/26/2017   HGBA1C 8.4 02/05/2017   Pt is on a regimen of: - Metformin 1000 mg 2x a day, with meals >> fishy smell >> Metformin ER 750 mg 2X a day - Glipizide 5 mg in am and  5 mg before dinner (did not stop since last visit) - Januvia 100 mg in a.m. - added 09/2016 - Invokana 100 mg in am - added 01/2017  Pt checks his sugars to exudate: - am: 144, 159-222, 240 >> 85-127 >> 81-123 >> 92-123, 143 - 2h after b'fast: n/c >> 198, 200 >> n/c - before lunch: n/c - 2h after lunch: n/c - before dinner: n/c - 2h after dinner: n/c - bedtime: 79-130, 141, 170 (pizza) >> 86-131, 173, 183 >> 67, 93-142 - nighttime: n/c Lowest sugar was 68 x1 >> 81 >> 92 Highest sugar was 170 >> 183 >> 142  Glucometer: Freestyle Lite  Pt's meals are: - Breakfast: oatmeal + blueberries + pecan; cereals; fruit juice; coffee - Lunch: chicken salad or sandwich - late - Dinner: cereal or light dinner - late - Snacks: takes snack boxes to work: yoghurt, veggies, grapes/prunes, chips/cheetos No formal exercise but stays active at work  -No CKD, last BUN/creatinine:  Lab Results  Component Value Date   BUN 15 05/26/2017   BUN 11 07/28/2016   CREATININE 0.72 05/26/2017   CREATININE 0.68 07/28/2016  On losartan.  No MAU: Lab Results  Component Value Date   MICRALBCREAT 1.2 06/30/2017   MICRALBCREAT 1.5 07/28/2016   MICRALBCREAT 1.0 10/18/2014   MICRALBCREAT 1.1 04/23/2014   MICRALBCREAT 0.8 09/08/2013   MICRALBCREAT 0.9 03/07/2010   MICRALBCREAT 11.5 02/14/2009   -+ HL; last set of lipids: Lab Results  Component Value Date   CHOL 149  06/30/2017   HDL 33.50 (L) 06/30/2017   LDLCALC 75 09/08/2013   LDLDIRECT 87.0 06/30/2017   TRIG 217.0 (H) 06/30/2017   CHOLHDL 4 06/30/2017  On Lipitor 20 every other day - last eye exam was: 01/2018: No DR Dr. Gershon Crane.  + cataracts. - no numbness and tingling in his feet.  He also has  HTN, GERD  ROS: Constitutional: no weight gain/no weight loss, no fatigue, no subjective hyperthermia, no subjective hypothermia Eyes: no blurry vision, no xerophthalmia ENT: no sore throat, no nodules palpated in throat, no dysphagia, no odynophagia, no hoarseness Cardiovascular: no CP/no SOB/no palpitations/no leg swelling Respiratory: no cough/no SOB/no wheezing Gastrointestinal: no N/no V/no D/no C/no acid reflux Musculoskeletal: no muscle aches/no joint aches Skin: no rashes, no hair loss Neurological: no tremors/no numbness/no tingling/no dizziness  I reviewed pt's medications, allergies, PMH, social hx, family hx, and changes were documented in the history of present illness. Otherwise, unchanged from my initial visit note.  Past Medical History:  Diagnosis Date  . ACNE ROSACEA 02/21/2009  . Allergy   . COUGH DUE TO ACE INHIBITORS 04/12/2009  . Diabetes mellitus without complication ([place])    type 2  . DYSPHAGIA 05/15/2009  . ESOPHAGITIS, REFLUX 05/13/2009  . FATIGUE 03/14/2010  . Fatty liver   . GERD (gastroesophageal reflux disease)   . HYPERGLYCEMIA 02/21/2009  .  HYPERLIPIDEMIA 02/21/2009  . HYPERTENSION NEC 02/21/2009  . INSOMNIA 04/02/2010  . MUSCLE PAIN 05/13/2009  . OBSTRUCTIVE SLEEP APNEA 04/02/2010  . RENAL CALCULUS 02/21/2009  . Sleep apnea   . TEMPOROMANDIBULAR JOINT DISORDER 06/10/2009  . Unspecified hypertrophic and atrophic condition of skin    Past Surgical History:  Procedure Laterality Date  . COLONOSCOPY    . ESOPHAGOGASTRODUODENOSCOPY    . KIDNEY STONE SURGERY     removal   Social History   Social History  . Marital status: Married    Spouse name: N/A   Social  History Main Topics  . Smoking status: Never Smoker  . Smokeless tobacco: Never Used  . Alcohol use 1.7 oz/week    1 burbon  . Drug use: No   Current Outpatient Medications on File Prior to Visit  Medication Sig Dispense Refill  . amLODipine (NORVASC) 10 MG tablet TAKE 1 TABLET (10 MG TOTAL) BY MOUTH DAILY. 90 tablet 3  . aspirin 81 MG tablet Take 81 mg by mouth daily.      Marland Kitchen atorvastatin (LIPITOR) 20 MG tablet TAKE 1 TABLET EVERY OTHER DAY 100 tablet 3  . FREESTYLE LITE test strip USE TO TEST GLUCOSE DAILY OR AS DIRECTED BY YOUR DOCTOR (DX E11.9) 100 each 4  . glipiZIDE (GLUCOTROL) 5 MG tablet TAKE 1 TABLET BY MOUTH BEFORE BREAKFAST AND 1 BEFORE YOUR EVENING MEAL 180 tablet 3  . glucose blood (FREESTYLE LITE) test strip USE TO TEST GLUCOSE DAILY OR AS DIRECTED BY YOUR DOCTOR 100 each 1  . INVOKANA 100 MG TABS tablet TAKE 1 TABLET (100 MG TOTAL) BY MOUTH DAILY BEFORE BREAKFAST. 90 tablet 1  . JANUVIA 100 MG tablet TAKE 1 TABLET (100 MG TOTAL) BY MOUTH DAILY. 90 tablet 3  . losartan (COZAAR) 100 MG tablet Take 1 tablet (100 mg total) by mouth daily. 100 tablet 3  . metFORMIN (GLUCOPHAGE-XR) 750 MG 24 hr tablet Take two tablets at dinner (Patient taking differently: Take one tab twice daily) 180 tablet 1  . Multiple Vitamin (MULTIVITAMIN) tablet Take 1 tablet by mouth daily.      . Omega-3 Fatty Acids (FISH OIL) 1000 MG CAPS Take by mouth daily.      Marland Kitchen OVER THE COUNTER MEDICATION Vitamin C 500 mg, one tablet daily.    Marland Kitchen OVER THE COUNTER MEDICATION Cinnamon 1000 mg one tablet daily.    Marland Kitchen OVER THE COUNTER MEDICATION CQ10 one tablet daily.    . pantoprazole (PROTONIX) 40 MG tablet Take 1 tablet (40 mg total) by mouth daily. 100 tablet 3  . zolpidem (AMBIEN) 5 MG tablet One  tablet each bedtime when necessary 90 tablet 3   No current facility-administered medications on file prior to visit.    Allergies  Allergen Reactions  . Hctz [Hydrochlorothiazide] Hives, Itching and Rash   Family  History  Problem Relation Age of Onset  . Healthy Mother   . Rectal cancer Sister   . Heart attack Father 46  . Healthy Brother   . Colon polyps Sister   . Heart disease Unknown        family histrory  . Diabetes Paternal Grandmother   . Colon cancer Neg Hx   . Esophageal cancer Neg Hx   . Liver cancer Neg Hx   . Pancreatic cancer Neg Hx   . Stomach cancer Neg Hx    PE: BP 138/74   Pulse 73   Ht 6\' 2"  (1.88 m)   Wt 233 lb 6.4  oz (105.9 kg)   SpO2 97%   BMI 29.97 kg/m  Wt Readings from Last 3 Encounters:  03/11/18 233 lb 6.4 oz (105.9 kg)  01/26/18 234 lb (106.1 kg)  01/14/18 234 lb 6.4 oz (106.3 kg)   Constitutional: overweight, in NAD Eyes: PERRLA, EOMI, no exophthalmos ENT: moist mucous membranes, no thyromegaly, no cervical lymphadenopathy Cardiovascular: RRR, No RG, +1/6 SEM Respiratory: CTA B Gastrointestinal: abdomen soft, NT, ND, BS+ Musculoskeletal: no deformities, strength intact in all 4 Skin: moist, warm, + stasis dermatitis bilaterally Neurological: no tremor with outstretched hands, DTR normal in all 4  Diabetic Foot Exam - Simple   Simple Foot Form Diabetic Foot exam was performed with the following findings:  Yes 03/11/2018  2:15 PM  Visual Inspection No deformities, no ulcerations, no other skin breakdown bilaterally:  Yes Sensation Testing Intact to touch and monofilament testing bilaterally:  Yes Pulse Check Posterior Tibialis and Dorsalis pulse intact bilaterally:  Yes Comments     ASSESSMENT: 1. DM2, non-insulin-dependent, uncontrolled, without long term complications, but with hyperglycemia  2. Overweight  3. HL  PLAN:  1. Patient with long-standing, previously uncontrolled diabetes, with much better control after starting Invokana.  I advised him to decrease the glipizide at last visit-only take the dose before dinner if he has a large meal.  At that time, his HbA1c was excellent, at 5.9%.  At this visit, he tells me that he thought  that his sugars were higher after he stopped the glipizide so he restarted to take it before every dinner. -At this visit, sugars are at goal for the vast majority of the time.  I advised him to try to stop glipizide at night, and, since he had a low blood sugar in the 60s before dinner, we can also try to stop the glipizide in the morning.  I did advise him to try to check some sugars in the middle of the day right after he stops the morning dose to make sure he does not develop hypoglycemia.  I advised him to take a dose of glipizide only with large meals, but not every day. - I suggested to:  Patient Instructions  Please continue: - Metformin ER 750 mg 2x a day, with meals - Januvia 100 mg before breakfast - Invokana 100 mg in am.  Please try to stop: - Glipizide but only use it for larger meals  Please return in 6 months with your sugar log.   - today, HbA1c is 5.8% (better) - continue checking sugars at different times of the day - check 1x a day, rotating checks - advised for yearly eye exams >> he is UTD - Foot exam performed today-normal - Return to clinic in 6 mo with sugar log    2. Overweight - Initially lost 10 pounds after starting Invokana  - weight stable now  3. HL - Reviewed latest lipid panel from 06/2017: LDL at goal, HDL low, triglycerides high - Continues the statin every other day without side effects.  Philemon Kingdom, MD PhD [facility] End[facility]

## 2018-03-28 ENCOUNTER — Other Ambulatory Visit: Payer: Self-pay | Admitting: Internal Medicine

## 2018-06-13 ENCOUNTER — Encounter: Payer: Self-pay | Admitting: Family Medicine

## 2018-06-22 ENCOUNTER — Other Ambulatory Visit: Payer: Self-pay | Admitting: Internal Medicine

## 2018-07-04 ENCOUNTER — Ambulatory Visit (INDEPENDENT_AMBULATORY_CARE_PROVIDER_SITE_OTHER): Payer: BLUE CROSS/BLUE SHIELD | Admitting: Family Medicine

## 2018-07-04 ENCOUNTER — Encounter: Payer: Self-pay | Admitting: Family Medicine

## 2018-07-04 VITALS — BP 106/68 | HR 88 | Temp 98.1°F | Ht 74.0 in | Wt 224.0 lb

## 2018-07-04 DIAGNOSIS — I10 Essential (primary) hypertension: Secondary | ICD-10-CM

## 2018-07-04 DIAGNOSIS — E1165 Type 2 diabetes mellitus with hyperglycemia: Secondary | ICD-10-CM

## 2018-07-04 DIAGNOSIS — E785 Hyperlipidemia, unspecified: Secondary | ICD-10-CM

## 2018-07-04 DIAGNOSIS — Z23 Encounter for immunization: Secondary | ICD-10-CM

## 2018-07-04 DIAGNOSIS — K2100 Gastro-esophageal reflux disease with esophagitis, without bleeding: Secondary | ICD-10-CM

## 2018-07-04 DIAGNOSIS — F5101 Primary insomnia: Secondary | ICD-10-CM

## 2018-07-04 DIAGNOSIS — R011 Cardiac murmur, unspecified: Secondary | ICD-10-CM

## 2018-07-04 DIAGNOSIS — E7801 Familial hypercholesterolemia: Secondary | ICD-10-CM

## 2018-07-04 DIAGNOSIS — Z0000 Encounter for general adult medical examination without abnormal findings: Secondary | ICD-10-CM | POA: Diagnosis not present

## 2018-07-04 DIAGNOSIS — Z125 Encounter for screening for malignant neoplasm of prostate: Secondary | ICD-10-CM

## 2018-07-04 LAB — POCT URINALYSIS DIPSTICK
Bilirubin, UA: NEGATIVE
Glucose, UA: POSITIVE — AB
Leukocytes, UA: NEGATIVE
NITRITE UA: NEGATIVE
PH UA: 5.5 (ref 5.0–8.0)
Protein, UA: NEGATIVE
RBC UA: NEGATIVE
SPEC GRAV UA: 1.02 (ref 1.010–1.025)
UROBILINOGEN UA: 0.2 U/dL

## 2018-07-04 LAB — HEPATIC FUNCTION PANEL
ALBUMIN: 4.4 g/dL (ref 3.5–5.2)
ALK PHOS: 77 U/L (ref 39–117)
ALT: 36 U/L (ref 0–53)
AST: 21 U/L (ref 0–37)
Bilirubin, Direct: 0.2 mg/dL (ref 0.0–0.3)
TOTAL PROTEIN: 6.6 g/dL (ref 6.0–8.3)
Total Bilirubin: 0.8 mg/dL (ref 0.2–1.2)

## 2018-07-04 LAB — LIPID PANEL
Cholesterol: 139 mg/dL (ref 0–200)
HDL: 26.1 mg/dL — AB (ref >39.00)
LDL Cholesterol: 74 mg/dL (ref 0–99)
NONHDL: 112.73
TRIGLYCERIDES: 195 mg/dL — AB (ref 0.0–149.0)
Total CHOL/HDL Ratio: 5
VLDL: 39 mg/dL (ref 0.0–40.0)

## 2018-07-04 LAB — CBC WITH DIFFERENTIAL/PLATELET
BASOS ABS: 0.1 10*3/uL (ref 0.0–0.1)
Basophils Relative: 1.1 % (ref 0.0–3.0)
EOS ABS: 0.3 10*3/uL (ref 0.0–0.7)
Eosinophils Relative: 4.6 % (ref 0.0–5.0)
HCT: 43.9 % (ref 39.0–52.0)
HEMOGLOBIN: 15.6 g/dL (ref 13.0–17.0)
Lymphocytes Relative: 20.9 % (ref 12.0–46.0)
Lymphs Abs: 1.4 10*3/uL (ref 0.7–4.0)
MCHC: 35.6 g/dL (ref 30.0–36.0)
MCV: 90.3 fl (ref 78.0–100.0)
MONO ABS: 1 10*3/uL (ref 0.1–1.0)
Monocytes Relative: 14.1 % — ABNORMAL HIGH (ref 3.0–12.0)
NEUTROS PCT: 59.3 % (ref 43.0–77.0)
Neutro Abs: 4 10*3/uL (ref 1.4–7.7)
Platelets: 247 10*3/uL (ref 150.0–400.0)
RBC: 4.86 Mil/uL (ref 4.22–5.81)
RDW: 13.5 % (ref 11.5–15.5)
WBC: 6.8 10*3/uL (ref 4.0–10.5)

## 2018-07-04 LAB — MICROALBUMIN / CREATININE URINE RATIO
CREATININE, U: 64.4 mg/dL
Microalb Creat Ratio: 1.4 mg/g (ref 0.0–30.0)
Microalb, Ur: 0.9 mg/dL (ref 0.0–1.9)

## 2018-07-04 LAB — BASIC METABOLIC PANEL
BUN: 13 mg/dL (ref 6–23)
CHLORIDE: 102 meq/L (ref 96–112)
CO2: 28 meq/L (ref 19–32)
Calcium: 9.5 mg/dL (ref 8.4–10.5)
Creatinine, Ser: 0.64 mg/dL (ref 0.40–1.50)
GFR: 133.25 mL/min (ref >60.00)
Glucose, Bld: 118 mg/dL — ABNORMAL HIGH (ref 70–99)
POTASSIUM: 3.9 meq/L (ref 3.5–5.1)
Sodium: 139 mEq/L (ref 135–145)

## 2018-07-04 LAB — HEMOGLOBIN A1C: Hgb A1c MFr Bld: 6.5 % (ref 4.6–6.5)

## 2018-07-04 LAB — PSA: PSA: 1.1 ng/mL (ref 0.10–4.00)

## 2018-07-04 LAB — TSH: TSH: 2.82 u[IU]/mL (ref 0.35–4.50)

## 2018-07-04 MED ORDER — AMLODIPINE BESYLATE 5 MG PO TABS: 5.0000 mg | ORAL_TABLET | Freq: Every day | ORAL | 3 refills | Status: DC

## 2018-07-04 MED ORDER — SILDENAFIL CITRATE 20 MG PO TABS: ORAL_TABLET | ORAL | 11 refills | Status: DC

## 2018-07-04 MED ORDER — PANTOPRAZOLE SODIUM 40 MG PO TBEC: 40.0000 mg | DELAYED_RELEASE_TABLET | Freq: Every day | ORAL | 3 refills | Status: DC

## 2018-07-04 MED ORDER — ZOLPIDEM TARTRATE 5 MG PO TABS: ORAL_TABLET | ORAL | 3 refills | Status: DC

## 2018-07-04 MED ORDER — ATORVASTATIN CALCIUM 20 MG PO TABS: ORAL_TABLET | ORAL | 3 refills | Status: DC

## 2018-07-04 MED ORDER — LOSARTAN POTASSIUM 100 MG PO TABS: 100.0000 mg | ORAL_TABLET | Freq: Every day | ORAL | 3 refills | Status: DC

## 2018-07-04 NOTE — Patient Instructions (Signed)
Labs today............ I will call you if there is anything abnormal  Walk 30 minutes daily  Continue current meds except decrease the Norvasc to 5 mg daily. Check your blood pressure daily for 4 weeks........... BP goal 135/85 or less. If he continues to stay in the 100-110 range call and we will readjust your medication  Return in one year for general physical exam sooner if any problems  Call your insurance company to find out where you can get the new shingles vaccine

## 2018-07-04 NOTE — Progress Notes (Signed)
Derrick Brown is a 67 year old married male nonsmoker who comes in today for annual physical examination because of the following issues  He has a history of diabetes type 2 currently on Invokana 100 mg before breakfast, metformin X are 750 mg twice a day,Januvia 100 mg daily. Last A1c in May was 5.8. He does not check his blood sugar at home  He has a history of underlying hypertension. He takes Norvasc 10 mg, Cozaar 100 mg daily. BP here today 106/68. He states he is not lightheaded when he stands up. We'll decrease his Norvasc since his pressure is so low  He takes Lipitor 20 mg daily at bedtime along with an aspirin tablet because of a history of hyper-lipidemia  He takes Protonix 40 mg daily because of chronic reflux.  He takes Ambien 5 mg about 10 pills a month when he just can't sleep.  He gets routine eye care, dental care, colonoscopy 2019 showed 2 polyps. He's due to go back in 5 years for follow-up  Vaccinations up-to-date information given on the new shingles vaccine. He was updated on his Pneumovax and flu shot today.  Cognitive function normal he walks every other day. Home health safety reviewed no issues identified, no guns in the house, he does have a healthcare power of attorney and living well  . Social history......Marland Kitchen Married lives here in [place] works for a Immunologist in [employer]  BP 106/68 (BP Location: Left Arm, Patient Position: Sitting, Cuff Size: Large)   Pulse 88   Temp 98.1 F (36.7 C) (Oral)   Ht 6\' 2"  (1.88 m)   Wt 224 lb (101.6 kg)   BMI 28.76 kg/m  Well-developed well-nourished slightly overweight male no acute distress vital signs stable he is afebrile HEENT were negative neck was supple thyroid not large no carotid bruits. Cardiac pulmonary exam normal...... He tells me that his endocrinologist told me had a heart murmur. I can appreciate no murmur however we'll get an echo for confirmation.  Abdominal exam negative no bruits liver  screening kidneys not enlarged no palpable masses. Genitalia normal circumcised male. Rectal normal stool guaiac-negative prostate normal............ Nocturia 3....... Extremities normal skin normal peripheral pulses normal  #1 diabetes type 2 at goal......... Check labs  #2 hypertension....... BP too low.......Marland Kitchen Decrease Norvasc from 10 mg a day to 5. Monitor blood pressure at home. BP goal 130/80  #3 hyperlipidemia....... Continue Lipitor check labs  #4 reflux esophagitis........ Continue proton X  #5 insomnia........... Continue when necessary Ambien  #6 question cardiac murmur......... Echocardiogram for evaluation

## 2018-07-05 ENCOUNTER — Other Ambulatory Visit: Payer: Self-pay | Admitting: Cardiovascular Disease

## 2018-07-08 ENCOUNTER — Encounter: Payer: Self-pay | Admitting: Internal Medicine

## 2018-07-19 ENCOUNTER — Ambulatory Visit (HOSPITAL_COMMUNITY): Payer: BLUE CROSS/BLUE SHIELD | Attending: Cardiology

## 2018-07-19 ENCOUNTER — Other Ambulatory Visit: Payer: Self-pay

## 2018-07-19 DIAGNOSIS — I10 Essential (primary) hypertension: Secondary | ICD-10-CM | POA: Insufficient documentation

## 2018-07-19 DIAGNOSIS — R011 Cardiac murmur, unspecified: Secondary | ICD-10-CM

## 2018-07-19 DIAGNOSIS — E119 Type 2 diabetes mellitus without complications: Secondary | ICD-10-CM | POA: Insufficient documentation

## 2018-07-19 DIAGNOSIS — I340 Nonrheumatic mitral (valve) insufficiency: Secondary | ICD-10-CM | POA: Diagnosis not present

## 2018-07-19 DIAGNOSIS — E785 Hyperlipidemia, unspecified: Secondary | ICD-10-CM | POA: Diagnosis not present

## 2018-08-05 ENCOUNTER — Other Ambulatory Visit: Payer: Self-pay | Admitting: Family Medicine

## 2018-08-05 DIAGNOSIS — E7801 Familial hypercholesterolemia: Secondary | ICD-10-CM

## 2018-09-12 ENCOUNTER — Ambulatory Visit: Payer: BLUE CROSS/BLUE SHIELD | Admitting: Internal Medicine

## 2018-09-12 ENCOUNTER — Encounter: Payer: Self-pay | Admitting: Internal Medicine

## 2018-09-12 VITALS — BP 120/76 | HR 88 | Ht 74.0 in | Wt 229.0 lb

## 2018-09-12 DIAGNOSIS — E1165 Type 2 diabetes mellitus with hyperglycemia: Secondary | ICD-10-CM

## 2018-09-12 DIAGNOSIS — E663 Overweight: Secondary | ICD-10-CM

## 2018-09-12 DIAGNOSIS — E785 Hyperlipidemia, unspecified: Secondary | ICD-10-CM | POA: Diagnosis not present

## 2018-09-12 LAB — POCT GLYCOSYLATED HEMOGLOBIN (HGB A1C): HEMOGLOBIN A1C: 6 % — AB (ref 4.0–5.6)

## 2018-09-12 MED ORDER — SITAGLIPTIN PHOSPHATE 100 MG PO TABS: ORAL_TABLET | ORAL | 3 refills | Status: DC

## 2018-09-12 MED ORDER — CANAGLIFLOZIN 100 MG PO TABS: ORAL_TABLET | ORAL | 3 refills | Status: DC

## 2018-09-12 NOTE — Progress Notes (Signed)
Patient ID: Derrick Brown, male   DOB: 14-Jul-1951, 67 y.o.   MRN: [id number]   HPI: Derrick Brown is a 67 y.o.-year-old male, returning for follow-up for DM2, dx in ~2007, non-insulin-dependent, now more controlled, without long term complications. Last visit 6 mo ago.  Last hemoglobin A1c was: Lab Results  Component Value Date   HGBA1C 6.5 07/04/2018   HGBA1C 5.8 (A) 03/11/2018   HGBA1C 5.9 09/17/2017   Pt is on a regimen of: - Metformin ER 750 mg 2x a day, with meals - Januvia 100 mg before breakfast - Invokana 100 mg in am. - Glipizide 5 mg as needed for a larger meal  Pt checks his sugars 2x a day -sugars are higher compared to last visit: - am: 85-127 >> 81-123 >> 92-123, 143 >> 119-151, 168 - 2h after b'fast: n/c >> 198, 200 >> n/c - before lunch: n/c - 2h after lunch: n/c - before dinner: n/c - 2h after dinner: n/c - bedtime: 86-131, 173, 183 >> 67, 93-142 >> 99, 128-169 - nighttime: n/c Lowest sugar was 68 x1 >> 81 >> 92 >> 99. Highest sugar was 170 >> 183 >> 142 >> 185  Glucometer: Freestyle Lite  Pt's meals are: - Breakfast: oatmeal + blueberries + pecan; cereals; fruit juice; coffee - Lunch: chicken salad or sandwich - late - Dinner: cereal or light dinner - late - Snacks: takes snack boxes to work: yoghurt, veggies, grapes/prunes, chips/cheetos No formal exercise.  - No CKD, last BUN/creatinine:  Lab Results  Component Value Date   BUN 13 07/04/2018   BUN 15 05/26/2017   CREATININE 0.64 07/04/2018   CREATININE 0.72 05/26/2017  On Losartan.  No MAU: Lab Results  Component Value Date   MICRALBCREAT 1.4 07/04/2018   MICRALBCREAT 1.2 06/30/2017   MICRALBCREAT 1.5 07/28/2016   MICRALBCREAT 1.0 10/18/2014   MICRALBCREAT 1.1 04/23/2014   MICRALBCREAT 0.8 09/08/2013   MICRALBCREAT 0.9 03/07/2010   MICRALBCREAT 11.5 02/14/2009   -+ HL; last set of lipids: Lab Results  Component Value Date   CHOL 139 07/04/2018   HDL 26.10 (L) 07/04/2018   LDLCALC 74  07/04/2018   LDLDIRECT 87.0 06/30/2017   TRIG 195.0 (H) 07/04/2018   CHOLHDL 5 07/04/2018  On Lipitor 20 mg qod. - last eye exam was: 01/2018: No DR, + cataracts. Dr. Gershon Crane. - no numbness and tingling in his feet.  He also has  HTN, GERD  ROS: Constitutional: no weight gain/+ weight loss, no fatigue, no subjective hyperthermia, no subjective hypothermia Eyes: no blurry vision, no xerophthalmia ENT: no sore throat, no nodules palpated in neck, no dysphagia, no odynophagia, no hoarseness Cardiovascular: no CP/no SOB/no palpitations/no leg swelling Respiratory: no cough/no SOB/no wheezing Gastrointestinal: no N/no V/no D/no C/no acid reflux Musculoskeletal: no muscle aches/no joint aches Skin: no rashes, no hair loss Neurological: no tremors/no numbness/no tingling/no dizziness  I reviewed pt's medications, allergies, PMH, social hx, family hx, and changes were documented in the history of present illness. Otherwise, unchanged from my initial visit note. Decreased Amlodipine dose 10 >> 5 mg /day.  Past Medical History:  Diagnosis Date  . ACNE ROSACEA 02/21/2009  . Allergy   . COUGH DUE TO ACE INHIBITORS 04/12/2009  . Diabetes mellitus without complication ([place])    type 2  . DYSPHAGIA 05/15/2009  . ESOPHAGITIS, REFLUX 05/13/2009  . FATIGUE 03/14/2010  . Fatty liver   . GERD (gastroesophageal reflux disease)   . HYPERGLYCEMIA 02/21/2009  . HYPERLIPIDEMIA 02/21/2009  .  HYPERTENSION NEC 02/21/2009  . INSOMNIA 04/02/2010  . MUSCLE PAIN 05/13/2009  . OBSTRUCTIVE SLEEP APNEA 04/02/2010  . RENAL CALCULUS 02/21/2009  . Sleep apnea   . TEMPOROMANDIBULAR JOINT DISORDER 06/10/2009  . Unspecified hypertrophic and atrophic condition of skin    Past Surgical History:  Procedure Laterality Date  . COLONOSCOPY    . ESOPHAGOGASTRODUODENOSCOPY    . KIDNEY STONE SURGERY     removal   Social History   Social History  . Marital status: Married    Spouse name: N/A   Social History Main Topics  .  Smoking status: Never Smoker  . Smokeless tobacco: Never Used  . Alcohol use 1.7 oz/week    1 burbon  . Drug use: No   Current Outpatient Medications on File Prior to Visit  Medication Sig Dispense Refill  . amLODipine (NORVASC) 5 MG tablet Take 1 tablet (5 mg total) by mouth daily. 90 tablet 3  . aspirin 81 MG tablet Take 81 mg by mouth daily.      Marland Kitchen atorvastatin (LIPITOR) 20 MG tablet TAKE 1 TABLET EVERY OTHER DAY 100 tablet 3  . glucose blood (FREESTYLE LITE) test strip USE TO TEST GLUCOSE DAILY OR AS DIRECTED BY YOUR DOCTOR 100 each 1  . INVOKANA 100 MG TABS tablet TAKE 1 TABLET (100 MG TOTAL) BY MOUTH DAILY BEFORE BREAKFAST. 90 tablet 1  . JANUVIA 100 MG tablet TAKE 1 TABLET (100 MG TOTAL) BY MOUTH DAILY. 90 tablet 3  . losartan (COZAAR) 100 MG tablet Take 1 tablet (100 mg total) by mouth daily. 100 tablet 3  . metFORMIN (GLUCOPHAGE-XR) 750 MG 24 hr tablet Take one tab twice daily 180 tablet 3  . Multiple Vitamin (MULTIVITAMIN) tablet Take 1 tablet by mouth daily.      . Omega-3 Fatty Acids (FISH OIL) 1000 MG CAPS Take by mouth daily.      Marland Kitchen OVER THE COUNTER MEDICATION Vitamin C 500 mg, one tablet daily.    Marland Kitchen OVER THE COUNTER MEDICATION Cinnamon 1000 mg one tablet daily.    Marland Kitchen OVER THE COUNTER MEDICATION CQ10 one tablet daily.    . pantoprazole (PROTONIX) 40 MG tablet Take 1 tablet (40 mg total) by mouth daily. 100 tablet 3  . sildenafil (REVATIO) 20 MG tablet Take 1 to 2 tabs 2 - 3 hours before sex 20 tablet 11  . zolpidem (AMBIEN) 5 MG tablet One  tablet each bedtime when necessary 45 tablet 3   No current facility-administered medications on file prior to visit.    Allergies  Allergen Reactions  . Hctz [Hydrochlorothiazide] Hives, Itching and Rash   Family History  Problem Relation Age of Onset  . Healthy Mother   . Rectal cancer Sister   . Heart attack Father 23  . Healthy Brother   . Colon polyps Sister   . Heart disease Unknown        family histrory  . Diabetes  Paternal Grandmother   . Colon cancer Neg Hx   . Esophageal cancer Neg Hx   . Liver cancer Neg Hx   . Pancreatic cancer Neg Hx   . Stomach cancer Neg Hx    PE: BP 120/76   Pulse 88   Ht 6\' 2"  (1.88 m)   Wt 229 lb (103.9 kg)   SpO2 99%   BMI 29.40 kg/m  Wt Readings from Last 3 Encounters:  09/12/18 229 lb (103.9 kg)  07/04/18 224 lb (101.6 kg)  03/11/18 233 lb 6.4 oz (105.9  kg)   Constitutional: overweight, in NAD Eyes: PERRLA, EOMI, no exophthalmos ENT: moist mucous membranes, no thyromegaly, no cervical lymphadenopathy Cardiovascular: RRR, No RG, +1/6 SEM Respiratory: CTA B Gastrointestinal: abdomen soft, NT, ND, BS+ Musculoskeletal: no deformities, strength intact in all 4 Skin: moist, warm, + rash: stasis dermatitis B Neurological: no tremor with outstretched hands, DTR normal in all 4  ASSESSMENT: 1. DM2, non-insulin-dependent, uncontrolled, without long term complications, but with hyperglycemia  2. Overweight  3. HL  PLAN:  1. Patient with long-standing, previously uncontrolled diabetes, with much better control after starting Invokana.  At last visit, his HbA1c was excellent so I advised him to stop glipizide and only use it as needed for larger meal.  I advised him to try to check some sugars in the middle of the day right after he stops the morning dose of glipizide to make sure he does not develop hyperglycemia.  Since last visit, HbA1c did increase slightly, to 6.5%, which is still at goal. - At this visit, per review of his log, his sugars are higher compared to before.  He is not sure why, as he did not change his diet.  He is still eating late dinners and eating out the majority of the time.  Does not identify any other factors that could contribute to his higher blood sugars.  Since his sugars are higher than goal in the morning, I advised him to try to move the entire metformin dose with dinnertime.  No other changes are needed for now. - I suggested to:   Patient Instructions  Please continue: - Januvia 100 mg before breakfast - Invokana 100 mg in am. - Glipizide as needed for a larger meal  Please move all Metformin ER at dinnertime (1500 mg).  Please return in 6 months with your sugar log.   - today, HbA1c is 6.0% (improved from 06/2018) - continue checking sugars at different times of the day - check 1x a day, rotating checks - advised for yearly eye exams >> he is UTD - UTD with flu shot - Return to clinic in 6 mo with sugar log     2. Overweight - lost 10 lbs after starting Invokana, then weight plateaued before last OV - Since last visit, he lost a net amount of 4 pounds  3. HL - Reviewed latest lipid panel from 06/2018: TG high, HDL low, but LDL at goal Lab Results  Component Value Date   CHOL 139 07/04/2018   HDL 26.10 (L) 07/04/2018   LDLCALC 74 07/04/2018   LDLDIRECT 87.0 06/30/2017   TRIG 195.0 (H) 07/04/2018   CHOLHDL 5 07/04/2018  - Continues the statin every other day without side effects.  Philemon Kingdom, MD PhD [facility] Endocrinology

## 2018-09-12 NOTE — Patient Instructions (Addendum)
Please continue: - Januvia 100 mg before breakfast - Invokana 100 mg in am. - Glipizide as needed for a larger meal  Please move all Metformin ER at dinnertime (1500 mg).  Please return in 6 months with your sugar log.

## 2018-09-26 ENCOUNTER — Ambulatory Visit: Payer: Self-pay

## 2018-09-26 NOTE — Telephone Encounter (Signed)
Pt.'s wife calling - pt. Had fever yesterday 100. Today 99.9. Body aches and headache. Wants Tamiflu called in. Offered appointment to wife. Will talk to pt. And call back.  Answer Assessment - Initial Assessment Questions 1. TEMPERATURE: "What is the most recent temperature?"  "How was it measured?"      99.9  oral 2. ONSET: "When did the fever start?"      Yesterday 3. SYMPTOMS: "Do you have any other symptoms besides the fever?"  (e.g., colds, headache, sore throat, earache, cough, rash, diarrhea, vomiting, abdominal pain)     Body aches, headache 4. CAUSE: If there are no symptoms, ask: "What do you think is causing the fever?"      "Maybe the flu" 5. CONTACTS: "Does anyone else in the family have an infection?"     No 6. TREATMENT: "What have you done so far to treat this fever?" (e.g., medications)     Ibu 7. IMMUNOCOMPROMISE: "Do you have of the following: diabetes, HIV positive, splenectomy, cancer chemotherapy, chronic steroid treatment, transplant patient, etc."     No 8. PREGNANCY: "Is there any chance you are pregnant?" "When was your last menstrual period?"     N/A 9. TRAVEL: "Have you traveled out of the country in the last month?" (e.g., travel history, exposures)     No  Protocols used: FEVER-A-AH

## 2018-09-29 ENCOUNTER — Encounter: Payer: Self-pay | Admitting: Internal Medicine

## 2018-09-30 ENCOUNTER — Ambulatory Visit: Payer: BLUE CROSS/BLUE SHIELD | Admitting: Family Medicine

## 2018-09-30 VITALS — BP 120/58 | HR 110 | Temp 98.3°F | Wt 224.9 lb

## 2018-09-30 DIAGNOSIS — N419 Inflammatory disease of prostate, unspecified: Secondary | ICD-10-CM

## 2018-09-30 DIAGNOSIS — R6883 Chills (without fever): Secondary | ICD-10-CM | POA: Diagnosis not present

## 2018-09-30 DIAGNOSIS — R350 Frequency of micturition: Secondary | ICD-10-CM

## 2018-09-30 DIAGNOSIS — R3915 Urgency of urination: Secondary | ICD-10-CM | POA: Diagnosis not present

## 2018-09-30 LAB — CBC WITH DIFFERENTIAL/PLATELET
Basophils Absolute: 0.1 10*3/uL (ref 0.0–0.1)
Basophils Relative: 0.3 % (ref 0.0–3.0)
Eosinophils Absolute: 0.2 10*3/uL (ref 0.0–0.7)
Eosinophils Relative: 1.3 % (ref 0.0–5.0)
HCT: 45.3 % (ref 39.0–52.0)
Hemoglobin: 15.7 g/dL (ref 13.0–17.0)
Lymphocytes Relative: 11.7 % — ABNORMAL LOW (ref 12.0–46.0)
Lymphs Abs: 1.9 10*3/uL (ref 0.7–4.0)
MCHC: 34.7 g/dL (ref 30.0–36.0)
MCV: 91.3 fl (ref 78.0–100.0)
Monocytes Absolute: 1.6 10*3/uL — ABNORMAL HIGH (ref 0.1–1.0)
Monocytes Relative: 9.9 % (ref 3.0–12.0)
Neutro Abs: 12.7 10*3/uL — ABNORMAL HIGH (ref 1.4–7.7)
Neutrophils Relative %: 76.8 % (ref 43.0–77.0)
PLATELETS: 196 10*3/uL (ref 150.0–400.0)
RBC: 4.96 Mil/uL (ref 4.22–5.81)
RDW: 14 % (ref 11.5–15.5)
WBC: 16.5 10*3/uL — ABNORMAL HIGH (ref 4.0–10.5)

## 2018-09-30 LAB — POCT URINALYSIS DIPSTICK
BILIRUBIN UA: NEGATIVE
Glucose, UA: POSITIVE — AB
Nitrite, UA: NEGATIVE
PH UA: 6 (ref 5.0–8.0)
Protein, UA: POSITIVE — AB
Spec Grav, UA: 1.015 (ref 1.010–1.025)
UROBILINOGEN UA: 0.2 U/dL

## 2018-09-30 LAB — COMPREHENSIVE METABOLIC PANEL
ALT: 34 U/L (ref 0–53)
AST: 16 U/L (ref 0–37)
Albumin: 4.2 g/dL (ref 3.5–5.2)
Alkaline Phosphatase: 88 U/L (ref 39–117)
BILIRUBIN TOTAL: 0.9 mg/dL (ref 0.2–1.2)
BUN: 14 mg/dL (ref 6–23)
CALCIUM: 9.4 mg/dL (ref 8.4–10.5)
CO2: 23 mEq/L (ref 19–32)
Chloride: 99 mEq/L (ref 96–112)
Creatinine, Ser: 0.72 mg/dL (ref 0.40–1.50)
GFR: 116.22 mL/min (ref >60.00)
Glucose, Bld: 134 mg/dL — ABNORMAL HIGH (ref 70–99)
Potassium: 4.1 mEq/L (ref 3.5–5.1)
Sodium: 136 mEq/L (ref 135–145)
Total Protein: 6.8 g/dL (ref 6.0–8.3)

## 2018-09-30 LAB — PSA: PSA: 13.99 ng/mL — AB (ref 0.10–4.00)

## 2018-09-30 MED ORDER — SULFAMETHOXAZOLE-TRIMETHOPRIM 800-160 MG PO TABS: 1.0000 | ORAL_TABLET | Freq: Two times a day (BID) | ORAL | 0 refills | Status: AC

## 2018-09-30 NOTE — Progress Notes (Signed)
Ivor Costa DOB: 16-Jun-1951 Encounter date: 09/30/2018  This is a 67 y.o. male who presents with Chief Complaint  Patient presents with  . Headache    night sweats, body aches, lower back pain, headache, urinary urgency, lethargic    History of present illness:  Tuesday before last had root canal and put on amoxicillin. Then last Saturday was very fatigued, felt like he was coming down with flu. Achy. Did get flu shot this year.   Some urinary urgency.   Severe headache for a few days,  Then just regular headache.   Feels better in afternoon, but by evening just run down.     Allergies  Allergen Reactions  . Hctz [Hydrochlorothiazide] Hives, Itching and Rash   Current Meds  Medication Sig  . amLODipine (NORVASC) 5 MG tablet Take 1 tablet (5 mg total) by mouth daily.  Marland Kitchen aspirin 81 MG tablet Take 81 mg by mouth daily.    Marland Kitchen atorvastatin (LIPITOR) 20 MG tablet TAKE 1 TABLET EVERY OTHER DAY  . canagliflozin (INVOKANA) 100 MG TABS tablet TAKE 1 TABLET (100 MG TOTAL) BY MOUTH DAILY BEFORE BREAKFAST.  Marland Kitchen glucose blood (FREESTYLE LITE) test strip USE TO TEST GLUCOSE DAILY OR AS DIRECTED BY YOUR DOCTOR  . losartan (COZAAR) 100 MG tablet Take 1 tablet (100 mg total) by mouth daily.  . metFORMIN (GLUCOPHAGE-XR) 750 MG 24 hr tablet Take one tab twice daily  . Multiple Vitamin (MULTIVITAMIN) tablet Take 1 tablet by mouth daily.    . Omega-3 Fatty Acids (FISH OIL) 1000 MG CAPS Take by mouth daily.    Marland Kitchen OVER THE COUNTER MEDICATION Vitamin C 500 mg, one tablet daily.  Marland Kitchen OVER THE COUNTER MEDICATION Cinnamon 1000 mg one tablet daily.  Marland Kitchen OVER THE COUNTER MEDICATION CQ10 one tablet daily.  . pantoprazole (PROTONIX) 40 MG tablet Take 1 tablet (40 mg total) by mouth daily.  . sildenafil (REVATIO) 20 MG tablet Take 1 to 2 tabs 2 - 3 hours before sex  . sitaGLIPtin (JANUVIA) 100 MG tablet TAKE 1 TABLET (100 MG TOTAL) BY MOUTH DAILY.  Marland Kitchen zolpidem (AMBIEN) 5 MG tablet One  tablet each bedtime when  necessary    Review of Systems  Constitutional: Positive for chills and fatigue. Negative for fever.  HENT: Negative for congestion, ear pain and sore throat.   Respiratory: Negative for cough and shortness of breath.   Cardiovascular: Negative for chest pain.  Gastrointestinal: Negative for abdominal pain, diarrhea and vomiting.  Genitourinary: Negative for difficulty urinating, dysuria and hematuria.  Musculoskeletal: Positive for back pain.    Objective:  BP (!) 120/58 (BP Location: Left Arm, Patient Position: Sitting, Cuff Size: Normal)   Pulse (!) 110 Comment: manual  Temp 98.3 F (36.8 C) (Oral)   Wt 224 lb 14.4 oz (102 kg)   BMI 28.88 kg/m   Weight: 224 lb 14.4 oz (102 kg)   BP Readings from Last 3 Encounters:  09/30/18 (!) 120/58  09/12/18 120/76  07/04/18 106/68   Wt Readings from Last 3 Encounters:  09/30/18 224 lb 14.4 oz (102 kg)  09/12/18 229 lb (103.9 kg)  07/04/18 224 lb (101.6 kg)    Physical Exam Constitutional:      General: He is not in acute distress.    Appearance: He is well-developed.  HENT:     Head: Normocephalic and atraumatic.     Mouth/Throat:     Mouth: Mucous membranes are moist.     Pharynx: Oropharynx is clear.  Neck:     Musculoskeletal: Neck supple.  Cardiovascular:     Rate and Rhythm: Normal rate and regular rhythm.     Heart sounds: Normal heart sounds. No murmur. No friction rub.     Comments: No lower extremity edema Pulmonary:     Effort: Pulmonary effort is normal. No respiratory distress.     Breath sounds: Normal breath sounds. No wheezing or rales.  Abdominal:     General: Bowel sounds are normal. There is no distension.     Palpations: Abdomen is soft.     Tenderness: There is no abdominal tenderness. There is no right CVA tenderness or left CVA tenderness.  Genitourinary:    Prostate: Enlarged (boggy; 4.5cm) and tender.  Lymphadenopathy:     Cervical: Cervical adenopathy (submandibulat bilat 1cm) present.   Neurological:     Mental Status: He is alert and oriented to person, place, and time.  Psychiatric:        Behavior: Behavior normal.     Assessment/Plan 1. Urinary frequency - POC Urinalysis Dipstick - Comprehensive metabolic panel; Future - Urine Culture; Future  2. Prostatitis, unspecified prostatitis type Will treat for prostatitis with bactrim DS BID x 4 weeks. Discussed long duration of treatment to ensure clearance. Discussed seeking care for any worsening of symptoms. - CBC with Differential/Platelet; Future - Comprehensive metabolic panel; Future - PSA - Urine Culture; Future  3. Chills - CBC with Differential/Platelet; Future  4. Urinary urgency See above; suspect related to prostate.   Return pending lab results.   Will check in with him once bloodwork and urine culture results have returned.    Micheline Rough, MD

## 2018-10-03 LAB — URINE CULTURE
MICRO NUMBER:: 91495541
SPECIMEN QUALITY:: ADEQUATE

## 2018-10-18 ENCOUNTER — Encounter: Payer: Self-pay | Admitting: Family Medicine

## 2018-10-23 ENCOUNTER — Other Ambulatory Visit: Payer: Self-pay | Admitting: Family Medicine

## 2018-10-23 DIAGNOSIS — N419 Inflammatory disease of prostate, unspecified: Secondary | ICD-10-CM

## 2018-10-28 ENCOUNTER — Other Ambulatory Visit (INDEPENDENT_AMBULATORY_CARE_PROVIDER_SITE_OTHER): Payer: BLUE CROSS/BLUE SHIELD

## 2018-10-28 DIAGNOSIS — N419 Inflammatory disease of prostate, unspecified: Secondary | ICD-10-CM

## 2018-10-28 LAB — CBC WITH DIFFERENTIAL/PLATELET
BASOS PCT: 1.1 % (ref 0.0–3.0)
Basophils Absolute: 0.1 10*3/uL (ref 0.0–0.1)
Eosinophils Absolute: 0.5 10*3/uL (ref 0.0–0.7)
Eosinophils Relative: 8.1 % — ABNORMAL HIGH (ref 0.0–5.0)
HCT: 45.8 % (ref 39.0–52.0)
Hemoglobin: 15.9 g/dL (ref 13.0–17.0)
Lymphocytes Relative: 36.3 % (ref 12.0–46.0)
Lymphs Abs: 2.2 10*3/uL (ref 0.7–4.0)
MCHC: 34.8 g/dL (ref 30.0–36.0)
MCV: 91.4 fl (ref 78.0–100.0)
Monocytes Absolute: 0.6 10*3/uL (ref 0.1–1.0)
Monocytes Relative: 9.5 % (ref 3.0–12.0)
Neutro Abs: 2.7 10*3/uL (ref 1.4–7.7)
Neutrophils Relative %: 45 % (ref 43.0–77.0)
Platelets: 228 10*3/uL (ref 150.0–400.0)
RBC: 5.01 Mil/uL (ref 4.22–5.81)
RDW: 13.9 % (ref 11.5–15.5)
WBC: 6.1 10*3/uL (ref 4.0–10.5)

## 2018-10-28 LAB — PSA: PSA: 2.12 ng/mL (ref 0.10–4.00)

## 2018-11-30 ENCOUNTER — Encounter: Payer: Self-pay | Admitting: Family Medicine

## 2018-11-30 ENCOUNTER — Ambulatory Visit: Payer: BLUE CROSS/BLUE SHIELD | Admitting: Family Medicine

## 2018-11-30 VITALS — BP 130/62 | HR 93 | Temp 98.3°F | Ht 74.0 in | Wt 228.7 lb

## 2018-11-30 DIAGNOSIS — K2100 Gastro-esophageal reflux disease with esophagitis, without bleeding: Secondary | ICD-10-CM

## 2018-11-30 DIAGNOSIS — E785 Hyperlipidemia, unspecified: Secondary | ICD-10-CM | POA: Diagnosis not present

## 2018-11-30 DIAGNOSIS — G4733 Obstructive sleep apnea (adult) (pediatric): Secondary | ICD-10-CM | POA: Diagnosis not present

## 2018-11-30 DIAGNOSIS — L719 Rosacea, unspecified: Secondary | ICD-10-CM

## 2018-11-30 DIAGNOSIS — E1165 Type 2 diabetes mellitus with hyperglycemia: Secondary | ICD-10-CM | POA: Diagnosis not present

## 2018-11-30 DIAGNOSIS — I10 Essential (primary) hypertension: Secondary | ICD-10-CM

## 2018-11-30 DIAGNOSIS — Z114 Encounter for screening for human immunodeficiency virus [HIV]: Secondary | ICD-10-CM | POA: Diagnosis not present

## 2018-11-30 LAB — LDL CHOLESTEROL, DIRECT: LDL DIRECT: 84 mg/dL

## 2018-11-30 LAB — MICROALBUMIN / CREATININE URINE RATIO
Creatinine,U: 57.1 mg/dL
MICROALB/CREAT RATIO: 1.3 mg/g (ref 0.0–30.0)
Microalb, Ur: 0.8 mg/dL (ref 0.0–1.9)

## 2018-11-30 LAB — LIPID PANEL
Cholesterol: 149 mg/dL (ref 0–200)
HDL: 26.9 mg/dL — ABNORMAL LOW (ref >39.00)
NonHDL: 121.72
Total CHOL/HDL Ratio: 6
Triglycerides: 308 mg/dL — ABNORMAL HIGH (ref 0.0–149.0)
VLDL: 61.6 mg/dL — ABNORMAL HIGH (ref 0.0–40.0)

## 2018-11-30 LAB — COMPREHENSIVE METABOLIC PANEL
ALK PHOS: 74 U/L (ref 39–117)
ALT: 45 U/L (ref 0–53)
AST: 22 U/L (ref 0–37)
Albumin: 4.5 g/dL (ref 3.5–5.2)
BUN: 13 mg/dL (ref 6–23)
CO2: 26 mEq/L (ref 19–32)
Calcium: 10.4 mg/dL (ref 8.4–10.5)
Chloride: 102 mEq/L (ref 96–112)
Creatinine, Ser: 0.71 mg/dL (ref 0.40–1.50)
GFR: 111.07 mL/min (ref >60.00)
Glucose, Bld: 164 mg/dL — ABNORMAL HIGH (ref 70–99)
Potassium: 4.1 mEq/L (ref 3.5–5.1)
Sodium: 139 mEq/L (ref 135–145)
TOTAL PROTEIN: 6.8 g/dL (ref 6.0–8.3)
Total Bilirubin: 0.5 mg/dL (ref 0.2–1.2)

## 2018-11-30 LAB — CBC WITH DIFFERENTIAL/PLATELET
Basophils Absolute: 0.1 10*3/uL (ref 0.0–0.1)
Basophils Relative: 1.2 % (ref 0.0–3.0)
EOS ABS: 0.3 10*3/uL (ref 0.0–0.7)
Eosinophils Relative: 4.7 % (ref 0.0–5.0)
HCT: 46.7 % (ref 39.0–52.0)
Hemoglobin: 16.1 g/dL (ref 13.0–17.0)
Lymphocytes Relative: 26.6 % (ref 12.0–46.0)
Lymphs Abs: 1.8 10*3/uL (ref 0.7–4.0)
MCHC: 34.6 g/dL (ref 30.0–36.0)
MCV: 91.3 fl (ref 78.0–100.0)
Monocytes Absolute: 0.6 10*3/uL (ref 0.1–1.0)
Monocytes Relative: 9.6 % (ref 3.0–12.0)
NEUTROS PCT: 57.9 % (ref 43.0–77.0)
Neutro Abs: 3.8 10*3/uL (ref 1.4–7.7)
Platelets: 248 10*3/uL (ref 150.0–400.0)
RBC: 5.11 Mil/uL (ref 4.22–5.81)
RDW: 14.2 % (ref 11.5–15.5)
WBC: 6.6 10*3/uL (ref 4.0–10.5)

## 2018-11-30 LAB — TSH: TSH: 2.93 u[IU]/mL (ref 0.35–4.50)

## 2018-11-30 NOTE — Addendum Note (Signed)
Addended by: Elmer Picker on: 11/30/2018 08:46 AM   Modules accepted: Orders

## 2018-11-30 NOTE — Progress Notes (Signed)
Derrick Brown DOB: September 28, 1951 Encounter date: 11/30/2018  This isa 68 y.o. male who presents to establish care. Chief Complaint  Patient presents with  . Transitions Of Care    History of present illness: HTN: checks at home sometimes. Had reduction in amlodipine from 10 to 5 mg. Usually in 120-130/70. On amlodipine, losartan 100mg .   HL: lipitor 20mg  daily.   DMII:Follows with endo. Last A1C was 6.5. On invokana, metformin, januvia.   Doesn't follow up with cardiology on regular basis. Does follow with endocrinology.   OSA: was heavier previously; after weight loss stopped snoring. Wife states not snoring any longer. Wakes up refreshed.   GERD: stlil has issues with reflux. Takes pantoprazole does help with symptoms.   Hearing loss left ear: bothers wife more than him.   Uses cream from derm for rosacea. Retin A has helped. Forgets other topical he is using.   Insomnia: Uses ambien 2-3 times/week. Not every night. Sometimes uses tylenol PM.  Had low T; gels didn't help much symptomatically  Repeat colonoscopy due 01/2023   Past Medical History:  Diagnosis Date  . ACNE ROSACEA 02/21/2009  . Allergy   . COUGH DUE TO ACE INHIBITORS 04/12/2009  . Diabetes mellitus without complication ([place])    type 2  . DYSPHAGIA 05/15/2009  . ESOPHAGITIS, REFLUX 05/13/2009  . FATIGUE 03/14/2010  . Fatty liver   . GERD (gastroesophageal reflux disease)   . HYPERGLYCEMIA 02/21/2009  . HYPERLIPIDEMIA 02/21/2009  . HYPERTENSION NEC 02/21/2009  . INSOMNIA 04/02/2010  . MUSCLE PAIN 05/13/2009  . OBSTRUCTIVE SLEEP APNEA 04/02/2010  . RENAL CALCULUS 02/21/2009  . Sleep apnea   . TEMPOROMANDIBULAR JOINT DISORDER 06/10/2009  . Unspecified hypertrophic and atrophic condition of skin    Past Surgical History:  Procedure Laterality Date  . COLONOSCOPY    . ESOPHAGOGASTRODUODENOSCOPY    . KIDNEY STONE SURGERY     removal  . TONSILLECTOMY AND ADENOIDECTOMY     Allergies  Allergen Reactions  . Hctz  [Hydrochlorothiazide] Hives, Itching and Rash   Current Meds  Medication Sig  . amLODipine (NORVASC) 5 MG tablet Take 1 tablet (5 mg total) by mouth daily.  Marland Kitchen aspirin 81 MG tablet Take 81 mg by mouth daily.    Marland Kitchen atorvastatin (LIPITOR) 20 MG tablet TAKE 1 TABLET EVERY OTHER DAY  . canagliflozin (INVOKANA) 100 MG TABS tablet TAKE 1 TABLET (100 MG TOTAL) BY MOUTH DAILY BEFORE BREAKFAST.  Marland Kitchen glucose blood (FREESTYLE LITE) test strip USE TO TEST GLUCOSE DAILY OR AS DIRECTED BY YOUR DOCTOR  . losartan (COZAAR) 100 MG tablet Take 1 tablet (100 mg total) by mouth daily.  . metFORMIN (GLUCOPHAGE-XR) 750 MG 24 hr tablet Take one tab twice daily  . Multiple Vitamin (MULTIVITAMIN) tablet Take 1 tablet by mouth daily.    . Omega-3 Fatty Acids (FISH OIL) 1000 MG CAPS Take by mouth daily.    Marland Kitchen OVER THE COUNTER MEDICATION Vitamin C 500 mg, one tablet daily.  Marland Kitchen OVER THE COUNTER MEDICATION Cinnamon 1000 mg one tablet daily.  Marland Kitchen OVER THE COUNTER MEDICATION CQ10 one tablet daily.  . pantoprazole (PROTONIX) 40 MG tablet Take 1 tablet (40 mg total) by mouth daily.  . sildenafil (REVATIO) 20 MG tablet Take 1 to 2 tabs 2 - 3 hours before sex  . sitaGLIPtin (JANUVIA) 100 MG tablet TAKE 1 TABLET (100 MG TOTAL) BY MOUTH DAILY.  Marland Kitchen zolpidem (AMBIEN) 5 MG tablet One  tablet each bedtime when necessary   Social History  Tobacco Use  . Smoking status: Never Smoker  . Smokeless tobacco: Never Used  Substance Use Topics  . Alcohol use: Yes    Alcohol/week: 3.0 standard drinks    Types: 2 Glasses of wine, 1 Standard drinks or equivalent per week   Family History  Problem Relation Age of Onset  . Stroke Mother 70  . Rectal cancer Sister   . Heart attack Father 29  . Healthy Brother   . Colon polyps Sister   . Heart disease Other        family histrory  . Diabetes Paternal Grandmother   . Colon cancer Neg Hx   . Esophageal cancer Neg Hx   . Liver cancer Neg Hx   . Pancreatic cancer Neg Hx   . Stomach cancer  Neg Hx      Review of Systems  Constitutional: Negative for chills, fatigue and fever.  Respiratory: Negative for cough, chest tightness, shortness of breath and wheezing.   Cardiovascular: Negative for chest pain, palpitations and leg swelling.    Objective:  BP 130/62 (BP Location: Left Arm, Patient Position: Sitting, Cuff Size: Normal)   Pulse 93   Temp 98.3 F (36.8 C) (Oral)   Ht 6\' 2"  (1.88 m)   Wt 228 lb 11.2 oz (103.7 kg)   SpO2 97%   BMI 29.36 kg/m   Weight: 228 lb 11.2 oz (103.7 kg)   BP Readings from Last 3 Encounters:  11/30/18 130/62  09/30/18 (!) 120/58  09/12/18 120/76   Wt Readings from Last 3 Encounters:  11/30/18 228 lb 11.2 oz (103.7 kg)  09/30/18 224 lb 14.4 oz (102 kg)  09/12/18 229 lb (103.9 kg)    Physical Exam Constitutional:      General: He is not in acute distress.    Appearance: He is well-developed.  Cardiovascular:     Rate and Rhythm: Normal rate and regular rhythm.     Heart sounds: Normal heart sounds. No murmur. No friction rub.  Pulmonary:     Effort: Pulmonary effort is normal. No respiratory distress.     Breath sounds: Normal breath sounds. No wheezing or rales.  Musculoskeletal:     Right lower leg: No edema.     Left lower leg: No edema.  Neurological:     Mental Status: He is alert and oriented to person, place, and time.  Psychiatric:        Behavior: Behavior normal.     Assessment/Plan:  1. Essential hypertension Stable; continue to check at home. We discussed monitoring for low bp. - CBC with Differential/Platelet; Future - Comprehensive metabolic panel; Future  2. OBSTRUCTIVE SLEEP APNEA No longer snoring; chronic conditions well controlled. Suspect resolution of OSA with weight loss.  3. ESOPHAGITIS, REFLUX Controlled. Monitor triggers.  4. Type 2 diabetes mellitus with hyperglycemia, without long-term current use of insulin ([facility]) - Microalbumin / creatinine urine ratio; Future  5. ACNE  ROSACEA Monitored by dermatology. Well controlled.  6. Hyperlipidemia, unspecified hyperlipidemia type - Lipid panel; Future - TSH; Future  Return in about 6 months (around 05/31/2019) for Chronic condition visit.  Micheline Rough, MD

## 2018-12-01 LAB — HIV ANTIBODY (ROUTINE TESTING W REFLEX): HIV: NONREACTIVE

## 2019-02-01 ENCOUNTER — Encounter: Payer: Self-pay | Admitting: Family Medicine

## 2019-02-02 ENCOUNTER — Other Ambulatory Visit: Payer: Self-pay

## 2019-02-02 ENCOUNTER — Ambulatory Visit (INDEPENDENT_AMBULATORY_CARE_PROVIDER_SITE_OTHER): Payer: BLUE CROSS/BLUE SHIELD | Admitting: Family Medicine

## 2019-02-02 DIAGNOSIS — J0110 Acute frontal sinusitis, unspecified: Secondary | ICD-10-CM

## 2019-02-02 MED ORDER — AMOXICILLIN-POT CLAVULANATE 875-125 MG PO TABS: 1.0000 | ORAL_TABLET | Freq: Two times a day (BID) | ORAL | 0 refills | Status: DC

## 2019-02-02 NOTE — Telephone Encounter (Signed)
Patient scheduled with Dr. Elease Hashimoto at 9:30 AM

## 2019-02-02 NOTE — Progress Notes (Signed)
Patient ID: Derrick Brown, male   DOB: 1951-05-15, 68 y.o.   MRN: [id number]  Virtual Visit via Video Note  I connected with Derrick Brown on 02/02/19 at  9:30 AM EDT by a video enabled telemedicine application and verified that I am speaking with the correct person using two identifiers.  Location patient: home Location provider:work or home office Persons participating in the virtual visit: patient, provider  I discussed the limitations of evaluation and management by telemedicine and the availability of in person appointments. The patient expressed understanding and agreed to proceed.   HPI: Patient relates about 6-week history of nasal congestion.  Has had frequent postnasal drip.  He initially attributed that to allergies.  He now has had 2 to 3 days of some greenish thick nasal discharge along with maxillary and frontal sinus pressure just on the left side.  He has had more frequent headaches.  No fevers or chills.  Occasional dry cough.  No dyspnea.  No sick contacts.  No bloody discharge.  Does have history of spring allergies and currently not taking any allergy medications.   ROS: See pertinent positives and negatives per HPI.  Past Medical History:  Diagnosis Date  . ACNE ROSACEA 02/21/2009  . Allergy   . COUGH DUE TO ACE INHIBITORS 04/12/2009  . Diabetes mellitus without complication ([place])    type 2  . DYSPHAGIA 05/15/2009  . ESOPHAGITIS, REFLUX 05/13/2009  . FATIGUE 03/14/2010  . Fatty liver   . GERD (gastroesophageal reflux disease)   . HYPERGLYCEMIA 02/21/2009  . HYPERLIPIDEMIA 02/21/2009  . HYPERTENSION NEC 02/21/2009  . INSOMNIA 04/02/2010  . MUSCLE PAIN 05/13/2009  . OBSTRUCTIVE SLEEP APNEA 04/02/2010  . RENAL CALCULUS 02/21/2009  . Sleep apnea   . TEMPOROMANDIBULAR JOINT DISORDER 06/10/2009  . Unspecified hypertrophic and atrophic condition of skin     Past Surgical History:  Procedure Laterality Date  . COLONOSCOPY    . ESOPHAGOGASTRODUODENOSCOPY    . KIDNEY STONE SURGERY      removal  . TONSILLECTOMY AND ADENOIDECTOMY      Family History  Problem Relation Age of Onset  . Stroke Mother 51  . Rectal cancer Sister   . Heart attack Father 70  . Healthy Brother   . Colon polyps Sister   . Heart disease Other        family histrory  . Diabetes Paternal Grandmother   . Colon cancer Neg Hx   . Esophageal cancer Neg Hx   . Liver cancer Neg Hx   . Pancreatic cancer Neg Hx   . Stomach cancer Neg Hx     SOCIAL HX: Non-smoker.  Works in Leisure centre manager   Current Outpatient Medications:  .  amLODipine (NORVASC) 5 MG tablet, Take 1 tablet (5 mg total) by mouth daily., Disp: 90 tablet, Rfl: 3 .  aspirin 81 MG tablet, Take 81 mg by mouth daily.  , Disp: , Rfl:  .  atorvastatin (LIPITOR) 20 MG tablet, TAKE 1 TABLET EVERY OTHER DAY, Disp: 100 tablet, Rfl: 3 .  canagliflozin (INVOKANA) 100 MG TABS tablet, TAKE 1 TABLET (100 MG TOTAL) BY MOUTH DAILY BEFORE BREAKFAST., Disp: 90 tablet, Rfl: 3 .  glucose blood (FREESTYLE LITE) test strip, USE TO TEST GLUCOSE DAILY OR AS DIRECTED BY YOUR DOCTOR, Disp: 100 each, Rfl: 1 .  losartan (COZAAR) 100 MG tablet, Take 1 tablet (100 mg total) by mouth daily., Disp: 100 tablet, Rfl: 3 .  metFORMIN (GLUCOPHAGE-XR) 750 MG 24 hr tablet, Take one  tab twice daily, Disp: 180 tablet, Rfl: 3 .  Multiple Vitamin (MULTIVITAMIN) tablet, Take 1 tablet by mouth daily.  , Disp: , Rfl:  .  Omega-3 Fatty Acids (FISH OIL) 1000 MG CAPS, Take by mouth daily.  , Disp: , Rfl:  .  OVER THE COUNTER MEDICATION, Vitamin C 500 mg, one tablet daily., Disp: , Rfl:  .  OVER THE COUNTER MEDICATION, Cinnamon 1000 mg one tablet daily., Disp: , Rfl:  .  OVER THE COUNTER MEDICATION, CQ10 one tablet daily., Disp: , Rfl:  .  pantoprazole (PROTONIX) 40 MG tablet, Take 1 tablet (40 mg total) by mouth daily., Disp: 100 tablet, Rfl: 3 .  sildenafil (REVATIO) 20 MG tablet, Take 1 to 2 tabs 2 - 3 hours before sex, Disp: 20 tablet, Rfl: 11 .  sitaGLIPtin (JANUVIA) 100 MG tablet,  TAKE 1 TABLET (100 MG TOTAL) BY MOUTH DAILY., Disp: 90 tablet, Rfl: 3 .  zolpidem (AMBIEN) 5 MG tablet, One  tablet each bedtime when necessary, Disp: 45 tablet, Rfl: 3  EXAM:  VITALS per patient if applicable:  GENERAL: alert, oriented, appears well and in no acute distress  HEENT: atraumatic, conjunttiva clear, no obvious abnormalities on inspection of external nose and ears  NECK: normal movements of the head and neck  LUNGS: on inspection no signs of respiratory distress, breathing rate appears normal, no obvious gross SOB, gasping or wheezing  CV: no obvious cyanosis  MS: moves all visible extremities without noticeable abnormality  PSYCH/NEURO: pleasant and cooperative, no obvious depression or anxiety, speech and thought processing grossly intact  ASSESSMENT AND PLAN:  Discussed the following assessment and plan:  Acute sinusitis left frontal and maxillary  -Stay well-hydrated and consider over-the-counter Mucinex -Consider over-the-counter Flonase -Augmentin 875 mg twice daily with food -Follow-up for any persistent or worsening symptoms     I discussed the assessment and treatment plan with the patient. The patient was provided an opportunity to ask questions and all were answered. The patient agreed with the plan and demonstrated an understanding of the instructions.   The patient was advised to call back or seek an in-person evaluation if the symptoms worsen or if the condition fails to improve as anticipated.   Carolann Littler, MD

## 2019-03-14 ENCOUNTER — Other Ambulatory Visit: Payer: Self-pay

## 2019-03-16 ENCOUNTER — Encounter: Payer: Self-pay | Admitting: Internal Medicine

## 2019-03-16 ENCOUNTER — Ambulatory Visit: Payer: BLUE CROSS/BLUE SHIELD | Admitting: Internal Medicine

## 2019-03-16 ENCOUNTER — Other Ambulatory Visit: Payer: Self-pay

## 2019-03-16 VITALS — BP 120/70 | HR 88 | Ht 74.0 in | Wt 230.0 lb

## 2019-03-16 DIAGNOSIS — E785 Hyperlipidemia, unspecified: Secondary | ICD-10-CM | POA: Diagnosis not present

## 2019-03-16 DIAGNOSIS — E663 Overweight: Secondary | ICD-10-CM | POA: Diagnosis not present

## 2019-03-16 DIAGNOSIS — E1165 Type 2 diabetes mellitus with hyperglycemia: Secondary | ICD-10-CM

## 2019-03-16 LAB — POCT GLYCOSYLATED HEMOGLOBIN (HGB A1C): Hemoglobin A1C: 6.3 % — AB (ref 4.0–5.6)

## 2019-03-16 MED ORDER — SEMAGLUTIDE 7 MG PO TABS: 7.0000 mg | ORAL_TABLET | Freq: Every day | ORAL | 5 refills | Status: DC

## 2019-03-16 NOTE — Progress Notes (Signed)
Patient ID: Derrick Brown, male   DOB: [birth year], 68 y.o.   MRN: [id number]   HPI: Derrick Brown is a 68 y.o.-year-old male, returning for follow-up for DM2, dx in ~2007, non-insulin-dependent, now more controlled, without long term complications. Last visit 6 months ago.  He had prostatitis in 09/2018 >> was on ABx for 1 mo >> sugars higher.  Reviewed HbA1c levels: Lab Results  Component Value Date   HGBA1C 6.0 (A) 09/12/2018   HGBA1C 6.5 07/04/2018   HGBA1C 5.8 (A) 03/11/2018   Pt is on a regimen of: - Metformin ER  750 mg twice a day with meals - Januvia 100 mg before breakfast - Invokana 100 mg in am. - Glipizide 5 mg as needed for a larger meal >> did not take it since last OV  Pt checks his sugars twice a day: - am:  92-123, 143 >> 119-151, 168 >> 123-154, 169 - 2h after b'fast: n/c >> 198, 200 >> n/c - before lunch: n/c - 2h after lunch: n/c - before dinner: n/c - 2h after dinner: n/c - bedtime:  67, 93-142 >> 99, 128-169 >> 128, 143-163, 177, 190, 193 (pasta) - nighttime: n/c Lowest sugar was 68 x1 >> 81 >> 92 >> 99 >> 123. Highest sugar was 170 >> 183 >> 142 >> 185 >> 193.  Glucometer: Freestyle Lite  Pt's meals are: - Breakfast: oatmeal + blueberries + pecan; cereals; fruit juice; coffee - Lunch: chicken salad or sandwich - late - Dinner: cereal or light dinner - late - Snacks: takes snack boxes to work: yoghurt, veggies, grapes/prunes, chips/cheetos No formal exercise.  -No CKD, last BUN/creatinine:  Lab Results  Component Value Date   BUN 13 11/30/2018   BUN 14 09/30/2018   CREATININE 0.71 11/30/2018   CREATININE 0.72 09/30/2018  On losartan.  No MAU: Lab Results  Component Value Date   MICRALBCREAT 1.3 11/30/2018   MICRALBCREAT 1.4 07/04/2018   MICRALBCREAT 1.2 06/30/2017   MICRALBCREAT 1.5 07/28/2016   MICRALBCREAT 1.0 10/18/2014   MICRALBCREAT 1.1 04/23/2014   MICRALBCREAT 0.8 09/08/2013   MICRALBCREAT 0.9 03/07/2010   MICRALBCREAT 11.5  02/14/2009   -+ HL; last set of lipids: Lab Results  Component Value Date   CHOL 149 11/30/2018   HDL 26.90 (L) 11/30/2018   LDLCALC 74 07/04/2018   LDLDIRECT 84.0 11/30/2018   TRIG 308.0 (H) 11/30/2018   CHOLHDL 6 11/30/2018  On Lipitor 20 mg every other day. - last eye exam was: 01/2018: No DR, + cataracts. Dr. Gershon Crane. - no numbness and tingling in his feet.  He also has HTN, GERD  ROS: Constitutional: no weight gain/no weight loss, no fatigue, no subjective hyperthermia, no subjective hypothermia Eyes: no blurry vision, no xerophthalmia ENT: no sore throat, no nodules palpated in neck, no dysphagia, no odynophagia, no hoarseness Cardiovascular: no CP/no SOB/no palpitations/no leg swelling Respiratory: no cough/no SOB/no wheezing Gastrointestinal: no N/no V/no D/no C/no acid reflux Musculoskeletal: no muscle aches/no joint aches Skin: no rashes, no hair loss Neurological: no tremors/no numbness/no tingling/no dizziness  I reviewed pt's medications, allergies, PMH, social hx, family hx, and changes were documented in the history of present illness. Otherwise, unchanged from my initial visit note.  Past Medical History:  Diagnosis Date  . ACNE ROSACEA 02/21/2009  . Allergy   . COUGH DUE TO ACE INHIBITORS 04/12/2009  . Diabetes mellitus without complication ([place])    type 2  . DYSPHAGIA 05/15/2009  . ESOPHAGITIS, REFLUX 05/13/2009  . FATIGUE  03/14/2010  . Fatty liver   . GERD (gastroesophageal reflux disease)   . HYPERGLYCEMIA 02/21/2009  . HYPERLIPIDEMIA 02/21/2009  . HYPERTENSION NEC 02/21/2009  . INSOMNIA 04/02/2010  . MUSCLE PAIN 05/13/2009  . OBSTRUCTIVE SLEEP APNEA 04/02/2010  . RENAL CALCULUS 02/21/2009  . Sleep apnea   . TEMPOROMANDIBULAR JOINT DISORDER 06/10/2009  . Unspecified hypertrophic and atrophic condition of skin    Past Surgical History:  Procedure Laterality Date  . COLONOSCOPY    . ESOPHAGOGASTRODUODENOSCOPY    . KIDNEY STONE SURGERY     removal  .  TONSILLECTOMY AND ADENOIDECTOMY     Social History   Social History  . Marital status: Married    Spouse name: N/A   Social History Main Topics  . Smoking status: Never Smoker  . Smokeless tobacco: Never Used  . Alcohol use 1.7 oz/week    1 burbon  . Drug use: No   Current Outpatient Medications on File Prior to Visit  Medication Sig Dispense Refill  . amLODipine (NORVASC) 5 MG tablet Take 1 tablet (5 mg total) by mouth daily. 90 tablet 3  . aspirin 81 MG tablet Take 81 mg by mouth daily.      Marland Kitchen atorvastatin (LIPITOR) 20 MG tablet TAKE 1 TABLET EVERY OTHER DAY 100 tablet 3  . canagliflozin (INVOKANA) 100 MG TABS tablet TAKE 1 TABLET (100 MG TOTAL) BY MOUTH DAILY BEFORE BREAKFAST. 90 tablet 3  . losartan (COZAAR) 100 MG tablet Take 1 tablet (100 mg total) by mouth daily. 100 tablet 3  . metFORMIN (GLUCOPHAGE-XR) 750 MG 24 hr tablet Take one tab twice daily 180 tablet 3  . Multiple Vitamin (MULTIVITAMIN) tablet Take 1 tablet by mouth daily.      . Omega-3 Fatty Acids (FISH OIL) 1000 MG CAPS Take by mouth daily.      Marland Kitchen OVER THE COUNTER MEDICATION Vitamin C 500 mg, one tablet daily.    Marland Kitchen OVER THE COUNTER MEDICATION Cinnamon 1000 mg one tablet daily.    Marland Kitchen OVER THE COUNTER MEDICATION CQ10 one tablet daily.    . pantoprazole (PROTONIX) 40 MG tablet Take 1 tablet (40 mg total) by mouth daily. 100 tablet 3  . sildenafil (REVATIO) 20 MG tablet Take 1 to 2 tabs 2 - 3 hours before sex 20 tablet 11  . sitaGLIPtin (JANUVIA) 100 MG tablet TAKE 1 TABLET (100 MG TOTAL) BY MOUTH DAILY. 90 tablet 3  . zolpidem (AMBIEN) 5 MG tablet One  tablet each bedtime when necessary 45 tablet 3   No current facility-administered medications on file prior to visit.    Allergies  Allergen Reactions  . Hctz [Hydrochlorothiazide] Hives, Itching and Rash   Family History  Problem Relation Age of Onset  . Stroke Mother 22  . Rectal cancer Sister   . Heart attack Father 43  . Healthy Brother   . Colon polyps  Sister   . Heart disease Other        family histrory  . Diabetes Paternal Grandmother   . Colon cancer Neg Hx   . Esophageal cancer Neg Hx   . Liver cancer Neg Hx   . Pancreatic cancer Neg Hx   . Stomach cancer Neg Hx    PE: BP 120/70   Pulse 88   Ht 6\' 2"  (1.88 m)   Wt 230 lb (104.3 kg)   SpO2 98%   BMI 29.53 kg/m  Wt Readings from Last 3 Encounters:  03/16/19 230 lb (104.3 kg)  11/30/18 228  lb 11.2 oz (103.7 kg)  09/30/18 224 lb 14.4 oz (102 kg)   Constitutional: overweight, in NAD Eyes: PERRLA, EOMI, no exophthalmos ENT: moist mucous membranes, no thyromegaly, no cervical lymphadenopathy Cardiovascular: RRR, No MRG Respiratory: CTA B Gastrointestinal: abdomen soft, NT, ND, BS+ Musculoskeletal: no deformities, strength intact in all 4 Skin: moist, warm,  + stasis dermatitis bilaterally Neurological: no tremor with outstretched hands, DTR normal in all 4  ASSESSMENT: 1. DM2, non-insulin-dependent, uncontrolled, without long term complications, but with hyperglycemia  2. Overweight  3. HL  PLAN:  1. Patient with longstanding, previously uncontrolled diabetes, with much improved control after starting Invokana.  We stopped glipizide in the past and advised him to only use it as needed for larger meal.  At last visit, HbA1c was excellent, at 6.0%.  Since sugars were slightly higher than goal in the morning, I advised him to move the entire metformin dose at night.  However, he tried this and sugars worsened.  He went back to taking it twice a day. -At this visit, sugars are higher than before, especially above target in the morning. -Discussed about several options for treatment including the following: Increasing metformin, increasing Invokana (I would like to avoid this due to his previous history of prostatitis), changing from Januvia to a po or injectable GLP-1 receptor agonist.  For now, he would want to avoid injectable medicines, so we will go ahead and start  Rybelsus.  Discussed about possible side effects and also benefits. - I suggested to:  Patient Instructions  Please continue: - Metformin ER  750 mg twice a day with meals - Invokana 100 mg before b'fast - Glipizide as needed for a larger meal  Stop Januvia.  Start - Rybelsus 7 mg daily before b'fast  Please return in 4 months with your sugar log.   - today, HbA1c is 6.3% (higher) - continue checking sugars at different times of the day - check 1x a day, rotating checks - advised for yearly eye exams >> he is not  UTD - Return to clinic in 4 mo with sugar log     2. Overweight -He lost 10 pounds after starting Invokana, afterwards, she lost another 4 pounds before our last visit.  Since last visit, lost and then gained weight,  overall, he is +1 pound -Continue the SGLT 2 inhibitor and will also add Rybelsus, which should also help with weight loss  3. HL - Reviewed latest lipid panel from 11/2018: LDL at goal, triglycerides high, HDL low Lab Results  Component Value Date   CHOL 149 11/30/2018   HDL 26.90 (L) 11/30/2018   LDLCALC 74 07/04/2018   LDLDIRECT 84.0 11/30/2018   TRIG 308.0 (H) 11/30/2018   CHOLHDL 6 11/30/2018  - Continues the statin every other day without side effects.  Philemon Kingdom, MD PhD [facility]

## 2019-03-16 NOTE — Patient Instructions (Addendum)
Patient Instructions  Please continue: - Metformin ER  750 mg twice a day with meals - Invokana 100 mg before b'fast - Glipizide as needed for a larger meal  Stop Januvia.  Start - Rybelsus 7 mg daily before b'fast  Please return in 4 months with your sugar log.

## 2019-03-17 MED ORDER — SEMAGLUTIDE 7 MG PO TABS: 7.0000 mg | ORAL_TABLET | Freq: Every day | ORAL | 0 refills | Status: DC

## 2019-03-28 ENCOUNTER — Other Ambulatory Visit: Payer: Self-pay | Admitting: Internal Medicine

## 2019-03-28 DIAGNOSIS — H2513 Age-related nuclear cataract, bilateral: Secondary | ICD-10-CM | POA: Diagnosis not present

## 2019-03-28 DIAGNOSIS — H5212 Myopia, left eye: Secondary | ICD-10-CM | POA: Diagnosis not present

## 2019-03-28 DIAGNOSIS — H52203 Unspecified astigmatism, bilateral: Secondary | ICD-10-CM | POA: Diagnosis not present

## 2019-03-28 DIAGNOSIS — H524 Presbyopia: Secondary | ICD-10-CM | POA: Diagnosis not present

## 2019-03-28 DIAGNOSIS — E119 Type 2 diabetes mellitus without complications: Secondary | ICD-10-CM | POA: Diagnosis not present

## 2019-03-28 DIAGNOSIS — H5201 Hypermetropia, right eye: Secondary | ICD-10-CM | POA: Diagnosis not present

## 2019-04-24 ENCOUNTER — Telehealth: Payer: Self-pay | Admitting: Family Medicine

## 2019-04-24 ENCOUNTER — Encounter: Payer: Self-pay | Admitting: Family Medicine

## 2019-04-24 NOTE — Telephone Encounter (Signed)
Please see if there are acute openings today. If not and sx are stable perhaps acute doxy with HK tomorrow?

## 2019-04-24 NOTE — Telephone Encounter (Signed)
Patient is calling back to schedule virtual with Dr. Maudie Mercury for tomorrow. Please advise Cb- [phone]

## 2019-04-25 ENCOUNTER — Telehealth: Payer: Self-pay | Admitting: *Deleted

## 2019-04-25 ENCOUNTER — Ambulatory Visit (INDEPENDENT_AMBULATORY_CARE_PROVIDER_SITE_OTHER): Payer: BC Managed Care – PPO | Admitting: Family Medicine

## 2019-04-25 ENCOUNTER — Other Ambulatory Visit: Payer: Self-pay

## 2019-04-25 ENCOUNTER — Encounter: Payer: Self-pay | Admitting: Family Medicine

## 2019-04-25 VITALS — Temp 97.0°F

## 2019-04-25 DIAGNOSIS — R509 Fever, unspecified: Secondary | ICD-10-CM

## 2019-04-25 DIAGNOSIS — Z20822 Contact with and (suspected) exposure to covid-19: Secondary | ICD-10-CM

## 2019-04-25 DIAGNOSIS — Z20828 Contact with and (suspected) exposure to other viral communicable diseases: Secondary | ICD-10-CM | POA: Diagnosis not present

## 2019-04-25 DIAGNOSIS — R197 Diarrhea, unspecified: Secondary | ICD-10-CM | POA: Diagnosis not present

## 2019-04-25 NOTE — Telephone Encounter (Signed)
-----   Message from Lucretia Kern, DO sent at 04/25/2019 11:58 AM EDT ----- -I sent [facility] referral for COVID19 testing-virtual f/u in 3 days

## 2019-04-25 NOTE — Telephone Encounter (Signed)
Scheduled patient for COVID 19 test tomorrow at 10:30 am at [facility].  Testing protocol reviewed.

## 2019-04-25 NOTE — Patient Instructions (Signed)
I have placed a referral for Coronavirus (COVID19) testing for you. Please call our office if you have any concerns or questions or this testing has not been arranged in the next 24-48 hours.   Drink plenty of fluids (with electrolytes if not eating). Can use imodium for diarrhea per instructions if needed. Tylenol if needed per instructions. I hope you are feeling better soon! Seek care promptly if your symptoms worsen, new concerns arise or you are not improving with treatment. Call ahead if seeking in person care.  Self Isolate: -see the CDC site for information:   RunningShows.co.za.html   -stay home except for to seek medical care -stay in your own room away from others in your house, wash hands frequently, wear a mask if you leave your room and interacted as little as possible with others -seek medical care if worsening - call out office for a visit or call ahead if going elsewhere -seek emergency care if very sick or severe symptoms - call 911 -isolate for at least 10 days from the onset of symptoms PLUS 3 days of no fever PLUS 3 days of improving symptoms, unless instructed otherwise by a doctor.   Follow up in 3 days. Sooner as needed.

## 2019-04-25 NOTE — Telephone Encounter (Signed)
Appointment scheduled with Dr. Maudie Mercury for 11:20 AM per patient request

## 2019-04-25 NOTE — Telephone Encounter (Signed)
Message sent to Dr Ethlyn Gallery as there are no openings available.

## 2019-04-25 NOTE — Progress Notes (Signed)
Virtual Visit via Video Note  I connected with@  on 04/25/19 at 11:20 AM EDT by a video enabled telemedicine application and verified that I am speaking with the correct person using two identifiers.  Location patient: home Location provider:work or home office Persons participating in the virtual visit: patient, provider  I discussed the limitations of evaluation and management by telemedicine and the availability of in person appointments. The patient expressed understanding and agreed to proceed.   HPI:  Acute visit for fever: -started about 3 days ago -symptoms include low grade fever on and off, diarrhea on and off the last 3 days, loss of appetite, malaise, mild body aches I the back, but not sure if is from laying in bed -denies:  hematochezia, melena, watery or excessive diarrhea (2-3 episodes per day), vomiting, abd pain, dysuria, radiation of back pain, weakness, numbness, bowel or bladder incontinence, loss of taste, rash, tick bites, sore throat, cough, nasal congestion, SOB, CP -no known sick contacts except wife has some sinus issues -patient works daily in Camera operator, he is around a lot of people, wears mask most of the time, but sometime not around collegues -BP 103/71 -fever 98-101, temp today 98 (fever gets higher in the evening)  ROS: See pertinent positives and negatives per HPI.  Past Medical History:  Diagnosis Date  . ACNE ROSACEA 02/21/2009  . Allergy   . COUGH DUE TO ACE INHIBITORS 04/12/2009  . Diabetes mellitus without complication ([place])    type 2  . DYSPHAGIA 05/15/2009  . ESOPHAGITIS, REFLUX 05/13/2009  . FATIGUE 03/14/2010  . Fatty liver   . GERD (gastroesophageal reflux disease)   . HYPERGLYCEMIA 02/21/2009  . HYPERLIPIDEMIA 02/21/2009  . HYPERTENSION NEC 02/21/2009  . INSOMNIA 04/02/2010  . MUSCLE PAIN 05/13/2009  . OBSTRUCTIVE SLEEP APNEA 04/02/2010  . RENAL CALCULUS 02/21/2009  . Sleep apnea   . TEMPOROMANDIBULAR JOINT DISORDER 06/10/2009  .  Unspecified hypertrophic and atrophic condition of skin     Past Surgical History:  Procedure Laterality Date  . COLONOSCOPY    . ESOPHAGOGASTRODUODENOSCOPY    . KIDNEY STONE SURGERY     removal  . TONSILLECTOMY AND ADENOIDECTOMY      Family History  Problem Relation Age of Onset  . Stroke Mother 33  . Rectal cancer Sister   . Heart attack Father 10  . Healthy Brother   . Colon polyps Sister   . Heart disease Other        family histrory  . Diabetes Paternal Grandmother   . Colon cancer Neg Hx   . Esophageal cancer Neg Hx   . Liver cancer Neg Hx   . Pancreatic cancer Neg Hx   . Stomach cancer Neg Hx     SOCIAL HX: see hpi   Current Outpatient Medications:  .  amLODipine (NORVASC) 5 MG tablet, Take 1 tablet (5 mg total) by mouth daily., Disp: 90 tablet, Rfl: 3 .  aspirin 81 MG tablet, Take 81 mg by mouth daily.  , Disp: , Rfl:  .  atorvastatin (LIPITOR) 20 MG tablet, TAKE 1 TABLET EVERY OTHER DAY, Disp: 100 tablet, Rfl: 3 .  canagliflozin (INVOKANA) 100 MG TABS tablet, TAKE 1 TABLET (100 MG TOTAL) BY MOUTH DAILY BEFORE BREAKFAST., Disp: 90 tablet, Rfl: 3 .  losartan (COZAAR) 100 MG tablet, Take 1 tablet (100 mg total) by mouth daily., Disp: 100 tablet, Rfl: 3 .  metFORMIN (GLUCOPHAGE-XR) 750 MG 24 hr tablet, TAKE 1 TABLET BY MOUTH TWICE A DAY,  Disp: 180 tablet, Rfl: 3 .  Multiple Vitamin (MULTIVITAMIN) tablet, Take 1 tablet by mouth daily.  , Disp: , Rfl:  .  Omega-3 Fatty Acids (FISH OIL) 1000 MG CAPS, Take by mouth daily.  , Disp: , Rfl:  .  OVER THE COUNTER MEDICATION, Vitamin C 500 mg, one tablet daily., Disp: , Rfl:  .  OVER THE COUNTER MEDICATION, Cinnamon 1000 mg one tablet daily., Disp: , Rfl:  .  OVER THE COUNTER MEDICATION, CQ10 one tablet daily., Disp: , Rfl:  .  pantoprazole (PROTONIX) 40 MG tablet, Take 1 tablet (40 mg total) by mouth daily., Disp: 100 tablet, Rfl: 3 .  Semaglutide (RYBELSUS) 7 MG TABS, Take 7 mg by mouth daily., Disp: 90 tablet, Rfl: 0 .   sildenafil (REVATIO) 20 MG tablet, Take 1 to 2 tabs 2 - 3 hours before sex, Disp: 20 tablet, Rfl: 11 .  sitaGLIPtin (JANUVIA) 100 MG tablet, TAKE 1 TABLET (100 MG TOTAL) BY MOUTH DAILY., Disp: 90 tablet, Rfl: 3 .  zolpidem (AMBIEN) 5 MG tablet, One  tablet each bedtime when necessary, Disp: 45 tablet, Rfl: 3  EXAM:  VITALS per patient if applicable: temp now 98, 103/71  GENERAL: alert, oriented, appears well and in no acute distress  HEENT: atraumatic, conjunttiva clear, no obvious abnormalities on inspection of external nose and ears  NECK: normal movements of the head and neck  LUNGS: on inspection no signs of respiratory distress, breathing rate appears normal, no obvious gross SOB, gasping or wheezing  CV: no obvious cyanosis  MS: moves all visible extremities without noticeable abnormality  PSYCH/NEURO: pleasant and cooperative, no obvious depression or anxiety, speech and thought processing grossly intact  ASSESSMENT AND PLAN: More than 50% of over 25 minutes spent in total in caring for this patient was spent  counseling and/or coordinating care.   Discussed the following assessment and plan:  Diarrhea, unspecified type  Fever, unspecified fever cause -  -we discussed possible serious and likely etiologies, workup and treatment, treatment risks and return precautions. Query mild viral gastroenteritis vs other, COVID19 testing warranted given exposure to others. Self quarantine advised per CDC guidelines. -after this discussion, Shelly opted for COVID19 testing - sent to [facility], home quarantine per CDC, imodium prn, tylenol prn, oral hydration -follow up advised in 3 days  -of course, we advised Zaiyden  to return or notify a doctor immediately if symptoms worsen or persist or new concerns arise.    I discussed the assessment and treatment plan with the patient. The patient was provided an opportunity to ask questions and all were answered. The patient agreed with the plan and  demonstrated an understanding of the instructions.   The patient was advised to call back or seek an in-person evaluation if the symptoms worsen or if the condition fails to improve as anticipated.    Follow up instructions: Advised assistant Wendie Simmer to help patient arrange the following: -I sent [facility] referral for COVID19 testing -virtual f/u in 3 days Lucretia Kern, DO

## 2019-04-25 NOTE — Telephone Encounter (Signed)
-----   Message from Lucretia Kern, DO sent at 04/25/2019 11:42 AM EDT ----- Please test for COVID19. Diarrhea, fevers, works around people, sometimes without proper masking.

## 2019-04-26 ENCOUNTER — Other Ambulatory Visit: Payer: BLUE CROSS/BLUE SHIELD

## 2019-04-26 DIAGNOSIS — Z20822 Contact with and (suspected) exposure to covid-19: Secondary | ICD-10-CM

## 2019-04-26 DIAGNOSIS — R6889 Other general symptoms and signs: Secondary | ICD-10-CM | POA: Diagnosis not present

## 2019-04-26 NOTE — Telephone Encounter (Signed)
Please open up my schedule 7/10 for half day morning and he can do follow up then.

## 2019-04-26 NOTE — Telephone Encounter (Signed)
I called the pt and scheduled an appt on 7/10 at 8:30am.

## 2019-04-27 ENCOUNTER — Other Ambulatory Visit: Payer: Self-pay | Admitting: Internal Medicine

## 2019-04-27 ENCOUNTER — Encounter: Payer: Self-pay | Admitting: Internal Medicine

## 2019-04-27 MED ORDER — SITAGLIPTIN PHOSPHATE 100 MG PO TABS: ORAL_TABLET | ORAL | 3 refills | Status: DC

## 2019-04-28 ENCOUNTER — Other Ambulatory Visit: Payer: Self-pay

## 2019-04-28 ENCOUNTER — Telehealth: Payer: Self-pay | Admitting: *Deleted

## 2019-04-28 ENCOUNTER — Ambulatory Visit (INDEPENDENT_AMBULATORY_CARE_PROVIDER_SITE_OTHER): Payer: BC Managed Care – PPO | Admitting: Family Medicine

## 2019-04-28 ENCOUNTER — Encounter: Payer: Self-pay | Admitting: Family Medicine

## 2019-04-28 DIAGNOSIS — E1165 Type 2 diabetes mellitus with hyperglycemia: Secondary | ICD-10-CM

## 2019-04-28 DIAGNOSIS — E785 Hyperlipidemia, unspecified: Secondary | ICD-10-CM | POA: Diagnosis not present

## 2019-04-28 DIAGNOSIS — Z20828 Contact with and (suspected) exposure to other viral communicable diseases: Secondary | ICD-10-CM | POA: Diagnosis not present

## 2019-04-28 DIAGNOSIS — Z20822 Contact with and (suspected) exposure to covid-19: Secondary | ICD-10-CM

## 2019-04-28 NOTE — Telephone Encounter (Signed)
-----   Message from Caren Macadam, MD sent at 04/28/2019  8:45 AM EDT ----- He has appointment set up with me in September; please schedule lab visit for bloodwork to be completed prior to this.

## 2019-04-28 NOTE — Progress Notes (Signed)
Virtual Visit via Video Note  I connected with Derrick Brown  on 04/28/19 at  8:30 AM EDT by a video enabled telemedicine application and verified that I am speaking with the correct person using two identifiers.  Location patient: home Location provider:work or home office Persons participating in the virtual visit: patient, provider  I discussed the limitations of evaluation and management by telemedicine and the availability of in person appointments. The patient expressed understanding and agreed to proceed.   Derrick Brown DOB: 1951-01-01 Encounter date: 04/28/2019  This is a 68 y.o. male who presents with Chief Complaint  Patient presents with  . Follow-up    History of present illness: Saw [facility] on 04/25/19 for 3 days fever - low grade on and off, diarrhea on and off x 3 days, loss appetite, malaise, body aches.   Doesn't think that he has COVID. Had testing done on Weds but doesn't have result yet. Temp this morning 97.4, bp 129/78. Weight 221. Feels that a lot of sx stemmed from change in diabetic medications. 1 mo ago started rybelus and after that seemed to really get wide ranges of blood sugars with testing. Switched back to [place] yesterday. Not running fever now, but had been up as high as 101. Just feels a little clammy, but not sure if just related to whats going on with his med changes. No respiratory symptoms. Diarrhea has resolved (just had this sx for one day).   Appetite is good. No loss of taste, smell, no change with hearing.   Allergies  Allergen Reactions  . Hctz [Hydrochlorothiazide] Hives, Itching and Rash   Current Meds  Medication Sig  . amLODipine (NORVASC) 5 MG tablet Take 1 tablet (5 mg total) by mouth daily.  Marland Kitchen aspirin 81 MG tablet Take 81 mg by mouth daily.    Marland Kitchen atorvastatin (LIPITOR) 20 MG tablet TAKE 1 TABLET EVERY OTHER DAY  . canagliflozin (INVOKANA) 100 MG TABS tablet TAKE 1 TABLET (100 MG TOTAL) BY MOUTH DAILY BEFORE BREAKFAST.  Marland Kitchen losartan (COZAAR)  100 MG tablet Take 1 tablet (100 mg total) by mouth daily.  . metFORMIN (GLUCOPHAGE-XR) 750 MG 24 hr tablet TAKE 1 TABLET BY MOUTH TWICE A DAY  . Multiple Vitamin (MULTIVITAMIN) tablet Take 1 tablet by mouth daily.    . Omega-3 Fatty Acids (FISH OIL) 1000 MG CAPS Take by mouth daily.    Marland Kitchen OVER THE COUNTER MEDICATION Vitamin C 500 mg, one tablet daily.  Marland Kitchen OVER THE COUNTER MEDICATION Cinnamon 1000 mg one tablet daily.  Marland Kitchen OVER THE COUNTER MEDICATION CQ10 one tablet daily.  . pantoprazole (PROTONIX) 40 MG tablet Take 1 tablet (40 mg total) by mouth daily.  . sildenafil (REVATIO) 20 MG tablet Take 1 to 2 tabs 2 - 3 hours before sex  . sitaGLIPtin (JANUVIA) 100 MG tablet TAKE 1 TABLET (100 MG TOTAL) BY MOUTH DAILY.  Marland Kitchen zolpidem (AMBIEN) 5 MG tablet One  tablet each bedtime when necessary  . [DISCONTINUED] Semaglutide (RYBELSUS) 7 MG TABS Take 7 mg by mouth daily.    Review of Systems  Constitutional: Negative for chills, fatigue and fever.       Complains of being a little clammy, but otherwise feels ok.  Respiratory: Negative for cough, chest tightness, shortness of breath and wheezing.   Cardiovascular: Negative for chest pain, palpitations and leg swelling.    Objective:  There were no vitals taken for this visit.      BP Readings from Last 3  Encounters:  03/16/19 120/70  11/30/18 130/62  09/30/18 (!) 120/58   Wt Readings from Last 3 Encounters:  03/16/19 230 lb (104.3 kg)  11/30/18 228 lb 11.2 oz (103.7 kg)  09/30/18 224 lb 14.4 oz (102 kg)    EXAM:  GENERAL: alert, oriented, appears well and in no acute distress  HEENT: atraumatic, conjunctiva clear, no obvious abnormalities on inspection of external nose and ears  NECK: normal movements of the head and neck  LUNGS: on inspection no signs of respiratory distress, breathing rate appears normal, no obvious gross SOB, gasping or wheezing  CV: no obvious cyanosis  MS: moves all visible extremities without noticeable  abnormality  PSYCH/NEURO: pleasant and cooperative, no obvious depression or anxiety, speech and thought processing grossly intact   Assessment/Plan  1. Type 2 diabetes mellitus with hyperglycemia, without long-term current use of insulin [facility]) Following with endocrinology. Working on med adjustment. I do agree with him that changes in meds and blood sugar fluctuations could have contributed to sx, but also seems there is potential for additional source of sx due to fevers. He is continuing to monitor sugars and will follow up with Dr. Renne Crigler.  - Comprehensive metabolic panel; Future - Hemoglobin A1c; Future  2. Hyperlipidemia, unspecified hyperlipidemia type - Lipid panel; Future  3. Exposure to Covid-19 Virus Still awaiting COVID test results. We discussed self-quarantine until these are known. We discussed monitoring of sx for any worsening and what to do if testing is indeed positive.    Return has physical scheduled; we will schedule bloodwork prior to this.  I discussed the assessment and treatment plan with the patient. The patient was provided an opportunity to ask questions and all were answered. The patient agreed with the plan and demonstrated an understanding of the instructions.   The patient was advised to call back or seek an in-person evaluation if the symptoms worsen or if the condition fails to improve as anticipated.  I provided 15 minutes of non-face-to-face time during this encounter.   Micheline Rough, MD

## 2019-04-28 NOTE — Telephone Encounter (Signed)
I called the pt and scheduled a lab appt for 9/16.

## 2019-05-01 ENCOUNTER — Encounter: Payer: Self-pay | Admitting: Family Medicine

## 2019-05-02 LAB — NOVEL CORONAVIRUS, NAA: SARS-CoV-2, NAA: NOT DETECTED

## 2019-05-20 ENCOUNTER — Encounter: Payer: Self-pay | Admitting: Family Medicine

## 2019-05-22 ENCOUNTER — Other Ambulatory Visit: Payer: Self-pay | Admitting: Family Medicine

## 2019-05-22 ENCOUNTER — Encounter: Payer: Self-pay | Admitting: Family Medicine

## 2019-05-22 MED ORDER — TRIAMCINOLONE ACETONIDE 0.1 % EX CREA: 1.0000 "application " | TOPICAL_CREAM | Freq: Two times a day (BID) | CUTANEOUS | 0 refills | Status: DC

## 2019-06-21 ENCOUNTER — Other Ambulatory Visit: Payer: Self-pay | Admitting: *Deleted

## 2019-06-22 MED ORDER — AMLODIPINE BESYLATE 5 MG PO TABS: 5.0000 mg | ORAL_TABLET | Freq: Every day | ORAL | 3 refills | Status: DC

## 2019-07-05 ENCOUNTER — Other Ambulatory Visit: Payer: Self-pay

## 2019-07-05 ENCOUNTER — Other Ambulatory Visit (INDEPENDENT_AMBULATORY_CARE_PROVIDER_SITE_OTHER): Payer: BC Managed Care – PPO

## 2019-07-05 DIAGNOSIS — E1165 Type 2 diabetes mellitus with hyperglycemia: Secondary | ICD-10-CM | POA: Diagnosis not present

## 2019-07-05 DIAGNOSIS — E785 Hyperlipidemia, unspecified: Secondary | ICD-10-CM

## 2019-07-05 LAB — HEMOGLOBIN A1C: Hgb A1c MFr Bld: 6.8 % — ABNORMAL HIGH (ref 4.6–6.5)

## 2019-07-05 LAB — COMPREHENSIVE METABOLIC PANEL
ALT: 30 U/L (ref 0–53)
AST: 17 U/L (ref 0–37)
Albumin: 4.3 g/dL (ref 3.5–5.2)
Alkaline Phosphatase: 64 U/L (ref 39–117)
BUN: 15 mg/dL (ref 6–23)
CO2: 29 mEq/L (ref 19–32)
Calcium: 10 mg/dL (ref 8.4–10.5)
Chloride: 101 mEq/L (ref 96–112)
Creatinine, Ser: 0.76 mg/dL (ref 0.40–1.50)
GFR: 102.5 mL/min (ref >60.00)
Glucose, Bld: 135 mg/dL — ABNORMAL HIGH (ref 70–99)
Potassium: 4.1 mEq/L (ref 3.5–5.1)
Sodium: 140 mEq/L (ref 135–145)
Total Bilirubin: 0.6 mg/dL (ref 0.2–1.2)
Total Protein: 6.6 g/dL (ref 6.0–8.3)

## 2019-07-05 LAB — LIPID PANEL
Cholesterol: 161 mg/dL (ref 0–200)
HDL: 27.4 mg/dL — ABNORMAL LOW (ref >39.00)
NonHDL: 133.58
Total CHOL/HDL Ratio: 6
Triglycerides: 389 mg/dL — ABNORMAL HIGH (ref 0.0–149.0)
VLDL: 77.8 mg/dL — ABNORMAL HIGH (ref 0.0–40.0)

## 2019-07-05 LAB — LDL CHOLESTEROL, DIRECT: Direct LDL: 78 mg/dL

## 2019-07-06 ENCOUNTER — Telehealth: Payer: Self-pay | Admitting: *Deleted

## 2019-07-06 DIAGNOSIS — E7801 Familial hypercholesterolemia: Secondary | ICD-10-CM

## 2019-07-06 NOTE — Telephone Encounter (Signed)
[facility] faxed a form requesting a refill on Pantoprazole 40mg -take 1 tablet by mouth every day-#100--last Rx given by Dr Sherren Mocha.

## 2019-07-07 MED ORDER — PANTOPRAZOLE SODIUM 40 MG PO TBEC: 40.0000 mg | DELAYED_RELEASE_TABLET | Freq: Every day | ORAL | 0 refills | Status: DC

## 2019-07-07 NOTE — Telephone Encounter (Signed)
Rx done. 

## 2019-07-07 NOTE — Telephone Encounter (Signed)
Ok for refill? 

## 2019-07-12 ENCOUNTER — Ambulatory Visit (INDEPENDENT_AMBULATORY_CARE_PROVIDER_SITE_OTHER): Payer: BC Managed Care – PPO | Admitting: Family Medicine

## 2019-07-12 ENCOUNTER — Other Ambulatory Visit: Payer: Self-pay

## 2019-07-12 ENCOUNTER — Encounter: Payer: Self-pay | Admitting: Family Medicine

## 2019-07-12 VITALS — BP 118/68 | HR 82 | Temp 97.2°F | Ht 72.0 in | Wt 229.8 lb

## 2019-07-12 DIAGNOSIS — E7801 Familial hypercholesterolemia: Secondary | ICD-10-CM | POA: Diagnosis not present

## 2019-07-12 DIAGNOSIS — Z0000 Encounter for general adult medical examination without abnormal findings: Secondary | ICD-10-CM

## 2019-07-12 DIAGNOSIS — I10 Essential (primary) hypertension: Secondary | ICD-10-CM

## 2019-07-12 DIAGNOSIS — G4700 Insomnia, unspecified: Secondary | ICD-10-CM | POA: Diagnosis not present

## 2019-07-12 MED ORDER — SHINGRIX 50 MCG/0.5ML IM SUSR: 0.5000 mL | Freq: Once | INTRAMUSCULAR | 0 refills | Status: AC

## 2019-07-12 MED ORDER — TRAZODONE HCL 50 MG PO TABS: 25.0000 mg | ORAL_TABLET | Freq: Every evening | ORAL | 3 refills | Status: DC | PRN

## 2019-07-12 NOTE — Addendum Note (Signed)
Addended by: Caren Macadam on: 07/12/2019 08:44 AM   Modules accepted: Orders

## 2019-07-12 NOTE — Progress Notes (Addendum)
Derrick Brown DOB: March 20, 1951 Encounter date: 07/12/2019  This is a 68 y.o. male who presents for complete physical   History of present illness/Additional concerns: Last visit was 04/28/19. Completed bloodwork 06/2019.  Rash on anterior shins went away with cream, but then seemed to come back up. Does seem to go away when he uses the cream. Another spot on shin - wife wanted it looked at.   DM: following w endocrinology - was adjusting medications at last visit and there was some concern for side effects with medication. Invokana, metformin, januvia. Feels better with this regimen. In mornings sugars around 130; in evening sugars 150-160.  HTN: amlodipine 5mg , losartan 100mg ; usually similar readings to today at home.  Insomnia: occasional ambien use; but usually tylenol pm. Lorrin Mais makes him feel more tired next day.  HL: lipitor 20mg  daily. No muscle aches/cramps.  GERD: pantoprazole every other day.   Follows regularly with dermatology Repeat colonoscopy due 01/2023  Past Medical History:  Diagnosis Date  . ACNE ROSACEA 02/21/2009  . Allergy   . COUGH DUE TO ACE INHIBITORS 04/12/2009  . Diabetes mellitus without complication ([place])    type 2  . DYSPHAGIA 05/15/2009  . ESOPHAGITIS, REFLUX 05/13/2009  . FATIGUE 03/14/2010  . Fatty liver   . GERD (gastroesophageal reflux disease)   . HYPERGLYCEMIA 02/21/2009  . HYPERLIPIDEMIA 02/21/2009  . HYPERTENSION NEC 02/21/2009  . INSOMNIA 04/02/2010  . MUSCLE PAIN 05/13/2009  . OBSTRUCTIVE SLEEP APNEA 04/02/2010  . RENAL CALCULUS 02/21/2009  . Sleep apnea   . TEMPOROMANDIBULAR JOINT DISORDER 06/10/2009  . Unspecified hypertrophic and atrophic condition of skin    Past Surgical History:  Procedure Laterality Date  . COLONOSCOPY    . ESOPHAGOGASTRODUODENOSCOPY    . KIDNEY STONE SURGERY     removal  . TONSILLECTOMY AND ADENOIDECTOMY     Allergies  Allergen Reactions  . Hctz [Hydrochlorothiazide] Hives, Itching and Rash   Current Meds  Medication Sig   . amLODipine (NORVASC) 5 MG tablet Take 1 tablet (5 mg total) by mouth daily.  Marland Kitchen aspirin 81 MG tablet Take 81 mg by mouth daily.    Marland Kitchen atorvastatin (LIPITOR) 20 MG tablet TAKE 1 TABLET EVERY OTHER DAY  . canagliflozin (INVOKANA) 100 MG TABS tablet TAKE 1 TABLET (100 MG TOTAL) BY MOUTH DAILY BEFORE BREAKFAST.  Marland Kitchen losartan (COZAAR) 100 MG tablet Take 1 tablet (100 mg total) by mouth daily.  . metFORMIN (GLUCOPHAGE-XR) 750 MG 24 hr tablet TAKE 1 TABLET BY MOUTH TWICE A DAY  . Multiple Vitamin (MULTIVITAMIN) tablet Take 1 tablet by mouth daily.    . Omega-3 Fatty Acids (FISH OIL) 1000 MG CAPS Take by mouth daily.    Marland Kitchen OVER THE COUNTER MEDICATION Vitamin C 500 mg, one tablet daily.  Marland Kitchen OVER THE COUNTER MEDICATION Cinnamon 1000 mg one tablet daily.  Marland Kitchen OVER THE COUNTER MEDICATION CQ10 one tablet daily.  . pantoprazole (PROTONIX) 40 MG tablet Take 1 tablet (40 mg total) by mouth daily.  . [place] 450 MG CAPS Take by mouth daily.  . sitaGLIPtin (JANUVIA) 100 MG tablet TAKE 1 TABLET (100 MG TOTAL) BY MOUTH DAILY.  Marland Kitchen triamcinolone cream (KENALOG) 0.1 % Apply 1 application topically 2 (two) times daily.  . [DISCONTINUED] sildenafil (REVATIO) 20 MG tablet Take 1 to 2 tabs 2 - 3 hours before sex  . [DISCONTINUED] zolpidem (AMBIEN) 5 MG tablet One  tablet each bedtime when necessary   Social History   Tobacco Use  . Smoking  status: Never Smoker  . Smokeless tobacco: Never Used  Substance Use Topics  . Alcohol use: Yes    Alcohol/week: 3.0 standard drinks    Types: 2 Glasses of wine, 1 Standard drinks or equivalent per week   Family History  Problem Relation Age of Onset  . Stroke Mother 34  . Rectal cancer Sister   . Heart attack Father 47  . Healthy Brother   . Colon polyps Sister   . Heart disease Other        family histrory  . Diabetes Paternal Grandmother   . Colon cancer Neg Hx   . Esophageal cancer Neg Hx   . Liver cancer Neg Hx   . Pancreatic cancer Neg Hx   . Stomach cancer  Neg Hx      Review of Systems  Constitutional: Negative for activity change, appetite change, chills, fatigue, fever and unexpected weight change.  HENT: Negative for congestion, ear pain, hearing loss, sinus pressure, sinus pain, sore throat and trouble swallowing.   Eyes: Negative for pain and visual disturbance.  Respiratory: Negative for cough, chest tightness, shortness of breath and wheezing.   Cardiovascular: Negative for chest pain, palpitations and leg swelling.  Gastrointestinal: Negative for abdominal distention, abdominal pain, blood in stool, constipation, diarrhea, nausea and vomiting.  Genitourinary: Negative for decreased urine volume, difficulty urinating, dysuria, penile pain and testicular pain.  Musculoskeletal: Negative for arthralgias, back pain and joint swelling.  Skin: Negative for rash.  Neurological: Negative for dizziness, weakness, numbness and headaches.  Hematological: Negative for adenopathy. Does not bruise/bleed easily.  Psychiatric/Behavioral: Negative for agitation, sleep disturbance and suicidal ideas. The patient is not nervous/anxious.     CBC:  Lab Results  Component Value Date   WBC 6.6 11/30/2018   HGB 16.1 11/30/2018   HCT 46.7 11/30/2018   MCH 32.0 11/01/2012   MCHC 34.6 11/30/2018   RDW 14.2 11/30/2018   PLT 248.0 11/30/2018   CMP: Lab Results  Component Value Date   NA 140 07/05/2019   K 4.1 07/05/2019   CL 101 07/05/2019   CO2 29 07/05/2019   GLUCOSE 135 (H) 07/05/2019   BUN 15 07/05/2019   CREATININE 0.76 07/05/2019   CREATININE 0.72 05/26/2017   GFRAA >89 05/26/2017   CALCIUM 10.0 07/05/2019   PROT 6.6 07/05/2019   BILITOT 0.6 07/05/2019   ALKPHOS 64 07/05/2019   ALT 30 07/05/2019   AST 17 07/05/2019   LIPID: Lab Results  Component Value Date   CHOL 161 07/05/2019   TRIG 389.0 (H) 07/05/2019   HDL 27.40 (L) 07/05/2019   LDLCALC 74 07/04/2018    Objective:  BP 118/68 (BP Location: Left Arm, Patient Position:  Sitting, Cuff Size: Large)   Pulse 82   Temp (!) 97.2 F (36.2 C) (Temporal)   Ht 6' (1.829 m)   Wt 229 lb 12.8 oz (104.2 kg)   SpO2 98%   BMI 31.17 kg/m   Weight: 229 lb 12.8 oz (104.2 kg)   BP Readings from Last 3 Encounters:  07/12/19 118/68  03/16/19 120/70  11/30/18 130/62   Wt Readings from Last 3 Encounters:  07/12/19 229 lb 12.8 oz (104.2 kg)  03/16/19 230 lb (104.3 kg)  11/30/18 228 lb 11.2 oz (103.7 kg)    Physical Exam Constitutional:      General: He is not in acute distress.    Appearance: He is well-developed.  HENT:     Head: Normocephalic and atraumatic.     Right Ear:  External ear normal.     Left Ear: External ear normal.     Nose: Nose normal.     Mouth/Throat:     Pharynx: No oropharyngeal exudate.  Eyes:     Conjunctiva/sclera: Conjunctivae normal.     Pupils: Pupils are equal, round, and reactive to light.  Neck:     Musculoskeletal: Neck supple.     Thyroid: No thyromegaly.  Cardiovascular:     Rate and Rhythm: Normal rate and regular rhythm.     Heart sounds: Normal heart sounds. No murmur. No friction rub. No gallop.   Pulmonary:     Effort: Pulmonary effort is normal. No respiratory distress.     Breath sounds: Normal breath sounds. No stridor. No wheezing or rales.  Abdominal:     General: Bowel sounds are normal.     Palpations: Abdomen is soft.  Musculoskeletal: Normal range of motion.  Skin:    General: Skin is warm and dry.     Comments: Normal diabetic foot exam  Neurological:     Mental Status: He is alert and oriented to person, place, and time.  Psychiatric:        Behavior: Behavior normal.        Thought Content: Thought content normal.        Judgment: Judgment normal.     Assessment/Plan: Health Maintenance Due  Topic Date Due  . FOOT EXAM  03/12/2019   Health Maintenance reviewed.  1. Preventative health care Work in increasing walking to at least 5 days/week. Consider some resistance training as well. Work on  cutting back foods/drinks that will elevate your cholesterol. Discussed in office together.   2. Hyperlipidemia, unspecified hyperlipidemia type Continue with lipitor. Will recheck in 3 mo time due to elevated TG.   3. Insomnia, unspecified type Trial trazodone instead of ambien if needed. Discussed new medication(s) today with patient. Discussed potential side effects and patient verbalized understanding.   5. Essential hypertension Well controlled. Continue current medication.   Reviewed all bloodwork together in office.    Return in about 6 months (around 01/09/2020) for Chronic condition visit in 6 months but recheck lipids in 3 months!Micheline Rough, MD

## 2019-07-12 NOTE — Patient Instructions (Signed)
High Triglycerides Eating Plan Triglycerides are a type of fat in the blood. High levels of triglycerides can increase your risk of heart disease and stroke. If your triglyceride levels are high, choosing the right foods can help lower your triglycerides and keep your heart healthy. Work with your health care provider or a diet and nutrition specialist (dietitian) to develop an eating plan that is right for you. What are tips for following this plan? General guidelines   Lose weight, if you are overweight. For most people, losing 5-10 lbs (2-5 kg) helps lower triglyceride levels. A weight-loss plan may include. ? 30 minutes of exercise at least 5 days a week. ? Reducing the amount of calories, sugar, and fat you eat.  Eat a wide variety of fresh fruits, vegetables, and whole grains. These foods are high in fiber.  Eat foods that contain healthy fats, such as fatty fish, nuts, seeds, and olive oil.  Avoid foods that are high in added sugar, added salt (sodium), saturated fat, and trans fat.  Avoid low-fiber, refined carbohydrates such as white bread, crackers, noodles, and white rice.  Avoid foods with partially hydrogenated oils (trans fats), such as fried foods or stick margarine.  Limit alcohol intake to no more than 1 drink a day for nonpregnant women and 2 drinks a day for men. One drink equals 12 oz of beer, 5 oz of wine, or 1 oz of hard liquor. Your health care provider may recommend that you drink less depending on your overall health. Reading food labels  Check food labels for the amount of saturated fat. Choose foods with no or very little saturated fat.  Check food labels for the amount of trans fat. Choose foods with no trans fat.  Check food labels for the amount of cholesterol. Choose foods low in cholesterol. Ask your dietitian how much cholesterol you should have each day.  Check food labels for the amount of sodium. Choose foods with less than 140 milligrams (mg) per  serving. Shopping  Buy dairy products labeled as nonfat (skim) or low-fat (1%).  Avoid buying processed or prepackaged foods. These are often high in added sugar, sodium, and fat. Cooking  Choose healthy fats when cooking, such as olive oil or canola oil.  Cook foods using lower fat methods, such as baking, broiling, boiling, or grilling.  Make your own sauces, dressings, and marinades when possible, instead of buying them. Store-bought sauces, dressings, and marinades are often high in sodium and sugar. Meal planning  Eat more home-cooked food and less restaurant, buffet, and fast food.  Eat fatty fish at least 2 times each week. Examples of fatty fish include salmon, trout, mackerel, tuna, and herring.  If you eat whole eggs, do not eat more than 3 egg yolks per week. What foods are recommended? The items listed may not be a complete list. Talk with your dietitian about what dietary choices are best for you. Grains Whole wheat or whole grain breads, crackers, cereals, and pasta. Unsweetened oatmeal. Bulgur. Barley. Quinoa. Brown rice. Whole wheat flour tortillas. Vegetables Fresh or frozen vegetables. Low-sodium canned vegetables. Fruits All fresh, canned (in natural juice), or frozen fruits. Meats and other protein foods Skinless chicken or turkey. Ground chicken or turkey. Lean cuts of pork, trimmed of fat. Fish and seafood, especially salmon, trout, and herring. Egg whites. Dried beans, peas, or lentils. Unsalted nuts or seeds. Unsalted canned beans. Natural peanut or almond butter. Dairy Low-fat dairy products. Skim or low-fat (1%) milk. Reduced fat (  2%) and low-sodium cheese. Low-fat ricotta cheese. Low-fat cottage cheese. Plain, low-fat yogurt. Fats and oils Tub margarine without trans fats. Light or reduced-fat mayonnaise. Light or reduced-fat salad dressings. Avocado. Safflower, olive, sunflower, soybean, and canola oils. What foods are not recommended? The items listed  may not be a complete list. Talk with your dietitian about what dietary choices are best for you. Grains White bread. White (regular) pasta. White rice. Cornbread. Bagels. Pastries. Crackers that contain trans fat. Vegetables Creamed or fried vegetables. Vegetables in a cheese sauce. Fruits Sweetened dried fruit. Canned fruit in syrup. Fruit juice. Meats and other protein foods Fatty cuts of meat. Ribs. Chicken wings. Bacon. Sausage. Bologna. Salami. Chitterlings. Fatback. Hot dogs. Bratwurst. Packaged lunch meats. Dairy Whole or reduced-fat (2%) milk. Half-and-half. Cream cheese. Full-fat or sweetened yogurt. Full-fat cheese. Nondairy creamers. Whipped toppings. Processed cheese or cheese spreads. Cheese curds. Beverages Alcohol. Sweetened drinks, such as soda, lemonade, fruit drinks, or punches. Fats and oils Butter. Stick margarine. Lard. Shortening. Ghee. Bacon fat. Tropical oils, such as coconut, palm kernel, or palm oils. Sweets and desserts Corn syrup. Sugars. Honey. Molasses. Candy. Jam and jelly. Syrup. Sweetened cereals. Cookies. Pies. Cakes. Donuts. Muffins. Ice cream. Condiments Store-bought sauces, dressings, and marinades that are high in sugar, such as ketchup and barbecue sauce. Summary  High levels of triglycerides can increase the risk of heart disease and stroke. Choosing the right foods can help lower your triglycerides.  Eat plenty of fresh fruits, vegetables, and whole grains. Choose low-fat dairy and lean meats. Eat fatty fish at least twice a week.  Avoid processed and prepackaged foods with added sugar, sodium, saturated fat, and trans fat.  If you need suggestions or have questions about what types of food are good for you, talk with your health care provider or a dietitian. This information is not intended to replace advice given to you by your health care provider. Make sure you discuss any questions you have with your health care provider. Document Released:  07/23/2004 Document Revised: 09/17/2017 Document Reviewed: 12/08/2016 Elsevier Patient Education  2020 [employer]  

## 2019-07-13 ENCOUNTER — Other Ambulatory Visit: Payer: Self-pay | Admitting: Family Medicine

## 2019-07-13 ENCOUNTER — Encounter: Payer: Self-pay | Admitting: Family Medicine

## 2019-07-14 ENCOUNTER — Other Ambulatory Visit: Payer: Self-pay | Admitting: Family Medicine

## 2019-07-14 ENCOUNTER — Other Ambulatory Visit: Payer: Self-pay

## 2019-07-14 MED ORDER — TRAZODONE HCL 50 MG PO TABS: 25.0000 mg | ORAL_TABLET | Freq: Every evening | ORAL | 1 refills | Status: DC | PRN

## 2019-07-18 ENCOUNTER — Other Ambulatory Visit: Payer: Self-pay

## 2019-07-18 ENCOUNTER — Encounter: Payer: Self-pay | Admitting: Internal Medicine

## 2019-07-18 ENCOUNTER — Ambulatory Visit (INDEPENDENT_AMBULATORY_CARE_PROVIDER_SITE_OTHER): Payer: BC Managed Care – PPO | Admitting: Internal Medicine

## 2019-07-18 VITALS — BP 132/60 | HR 85 | Ht 73.0 in | Wt 229.0 lb

## 2019-07-18 DIAGNOSIS — E663 Overweight: Secondary | ICD-10-CM

## 2019-07-18 DIAGNOSIS — E785 Hyperlipidemia, unspecified: Secondary | ICD-10-CM | POA: Diagnosis not present

## 2019-07-18 DIAGNOSIS — E1165 Type 2 diabetes mellitus with hyperglycemia: Secondary | ICD-10-CM | POA: Diagnosis not present

## 2019-07-18 NOTE — Patient Instructions (Addendum)
Please continue: - Metformin ER 750 mg 2x a day - Invokana 100 mg before b'fast - Glipizide 5 mg as needed for a larger meal - Januvia 100 mg before b'fast  Write down comments about the unusual blood sugars.  Increase Fish oil to 1000 mg 2x a day.  Please return in 4 months with your sugar log.

## 2019-07-18 NOTE — Progress Notes (Signed)
Patient ID: AARIS YACKEL, male   DOB: 1950-11-18, 68 y.o.   MRN: [id number]   HPI: PARKE CONNEELY is a 68 y.o.-year-old male, returning for follow-up for DM2, dx in ~2007, non-insulin-dependent, now more controlled, without long term complications. Last visit 4 months ago.  Reviewed HbA1c levels: Lab Results  Component Value Date   HGBA1C 6.8 (H) 07/05/2019   HGBA1C 6.3 (A) 03/16/2019   HGBA1C 6.0 (A) 09/12/2018   Pt is on a regimen of: - Metformin ER  750 mg twice a day with meals >> 1500 mg with dinner >> nausea >> 750 mg 2x a day - Januvia 100 mg before breakfast - Invokana 100 mg in am. - Glipizide 5 mg as needed for larger meal In 02/2019 we tried Rybelsus but he could not tolerate this due to GI side effects and sugars were also more fluctuating.  We restarted Januvia afterwards.  Pt checks his sugars twice a day: - am: 119-151, 168 >> 123-154, 169 >> 111, 121-163 - 2h after b'fast: n/c >> 198, 200 >> n/c - before lunch: n/c - 2h after lunch: n/c - before dinner: n/c - 2h after dinner: n/c - bedtime:  128, 143-163, 177, 190, 193 (pasta) >> 119-205 - nighttime: n/c Lowest sugar was 99 >> 123 >> 90. Highest sugar was 185 >> 193 >> 205.  Glucometer: Freestyle Lite  Pt's meals are: - Breakfast: oatmeal + blueberries + pecan; cereals; fruit juice; coffee - Lunch: chicken salad or sandwich - late - Dinner: cereal or light dinner - late - Snacks: takes snack boxes to work: yoghurt, veggies, grapes/prunes, chips/cheetos No formal exercise.  -No CKD, last BUN/creatinine:  Lab Results  Component Value Date   BUN 15 07/05/2019   BUN 13 11/30/2018   CREATININE 0.76 07/05/2019   CREATININE 0.71 11/30/2018  On losartan.  No MAU: Lab Results  Component Value Date   MICRALBCREAT 1.3 11/30/2018   MICRALBCREAT 1.4 07/04/2018   MICRALBCREAT 1.2 06/30/2017   MICRALBCREAT 1.5 07/28/2016   MICRALBCREAT 1.0 10/18/2014   MICRALBCREAT 1.1 04/23/2014   MICRALBCREAT 0.8 09/08/2013    MICRALBCREAT 0.9 03/07/2010   MICRALBCREAT 11.5 02/14/2009   -+ HL; last set of lipids: Lab Results  Component Value Date   CHOL 161 07/05/2019   HDL 27.40 (L) 07/05/2019   LDLCALC 74 07/04/2018   LDLDIRECT 78.0 07/05/2019   TRIG 389.0 (H) 07/05/2019   CHOLHDL 6 07/05/2019  On Lipitor 20 every other day. On Fish Oil 1000 mg dailt - last eye exam was: 01/2019: No DR, + cataracts. Dr. Gershon Crane. -He denies numbness and tingling in his feet. Foot exam 06/2019  He also has HTN, GERD  ROS: Constitutional: no weight gain/no weight loss, no fatigue, no subjective hyperthermia, no subjective hypothermia Eyes: no blurry vision, no xerophthalmia ENT: no sore throat, no nodules palpated in neck, no dysphagia, no odynophagia, no hoarseness Cardiovascular: no CP/no SOB/no palpitations/no leg swelling Respiratory: no cough/no SOB/no wheezing Gastrointestinal: no N/no V/no D/no C/no acid reflux Musculoskeletal: no muscle aches/no joint aches Skin: no rashes, no hair loss Neurological: no tremors/no numbness/no tingling/no dizziness  I reviewed pt's medications, allergies, PMH, social hx, family hx, and changes were documented in the history of present illness. Otherwise, unchanged from my initial visit note.  Past Medical History:  Diagnosis Date  . ACNE ROSACEA 02/21/2009  . Allergy   . COUGH DUE TO ACE INHIBITORS 04/12/2009  . Diabetes mellitus without complication ([place])    type 2  .  DYSPHAGIA 05/15/2009  . ESOPHAGITIS, REFLUX 05/13/2009  . FATIGUE 03/14/2010  . Fatty liver   . GERD (gastroesophageal reflux disease)   . HYPERGLYCEMIA 02/21/2009  . HYPERLIPIDEMIA 02/21/2009  . HYPERTENSION NEC 02/21/2009  . INSOMNIA 04/02/2010  . MUSCLE PAIN 05/13/2009  . OBSTRUCTIVE SLEEP APNEA 04/02/2010  . RENAL CALCULUS 02/21/2009  . Sleep apnea   . TEMPOROMANDIBULAR JOINT DISORDER 06/10/2009  . Unspecified hypertrophic and atrophic condition of skin    Past Surgical History:  Procedure Laterality Date  .  COLONOSCOPY    . ESOPHAGOGASTRODUODENOSCOPY    . KIDNEY STONE SURGERY     removal  . TONSILLECTOMY AND ADENOIDECTOMY     Social History   Social History  . Marital status: Married    Spouse name: N/A   Social History Main Topics  . Smoking status: Never Smoker  . Smokeless tobacco: Never Used  . Alcohol use 1.7 oz/week    1 burbon  . Drug use: No   Current Outpatient Medications on File Prior to Visit  Medication Sig Dispense Refill  . amLODipine (NORVASC) 5 MG tablet Take 1 tablet (5 mg total) by mouth daily. 90 tablet 3  . aspirin 81 MG tablet Take 81 mg by mouth daily.      Marland Kitchen atorvastatin (LIPITOR) 20 MG tablet TAKE 1 TABLET EVERY OTHER DAY 100 tablet 3  . canagliflozin (INVOKANA) 100 MG TABS tablet TAKE 1 TABLET (100 MG TOTAL) BY MOUTH DAILY BEFORE BREAKFAST. 90 tablet 3  . losartan (COZAAR) 100 MG tablet Take 1 tablet (100 mg total) by mouth daily. 100 tablet 3  . metFORMIN (GLUCOPHAGE-XR) 750 MG 24 hr tablet TAKE 1 TABLET BY MOUTH TWICE A DAY 180 tablet 3  . Multiple Vitamin (MULTIVITAMIN) tablet Take 1 tablet by mouth daily.      . Omega-3 Fatty Acids (FISH OIL) 1000 MG CAPS Take by mouth daily.      Marland Kitchen OVER THE COUNTER MEDICATION Vitamin C 500 mg, one tablet daily.    Marland Kitchen OVER THE COUNTER MEDICATION Cinnamon 1000 mg one tablet daily.    Marland Kitchen OVER THE COUNTER MEDICATION CQ10 one tablet daily.    . pantoprazole (PROTONIX) 40 MG tablet Take 1 tablet (40 mg total) by mouth daily. 100 tablet 0  . Saw Palmetto 450 MG CAPS Take by mouth daily.    . sitaGLIPtin (JANUVIA) 100 MG tablet TAKE 1 TABLET (100 MG TOTAL) BY MOUTH DAILY. 90 tablet 3  . traZODone (DESYREL) 50 MG tablet Take 0.5-1 tablets (25-50 mg total) by mouth at bedtime as needed for sleep. 90 tablet 1  . triamcinolone cream (KENALOG) 0.1 % APPLY TO AFFECTED AREA TWICE A DAY 30 g 0   No current facility-administered medications on file prior to visit.    Allergies  Allergen Reactions  . Hctz [Hydrochlorothiazide] Hives,  Itching and Rash   Family History  Problem Relation Age of Onset  . Stroke Mother 63  . Rectal cancer Sister   . Heart attack Father 63  . Healthy Brother   . Colon polyps Sister   . Heart disease Other        family histrory  . Diabetes Paternal Grandmother   . Colon cancer Neg Hx   . Esophageal cancer Neg Hx   . Liver cancer Neg Hx   . Pancreatic cancer Neg Hx   . Stomach cancer Neg Hx    PE: BP 132/60   Pulse 85   Ht 6\' 1"  (1.854 m) Comment: measured today  without shoes  Wt 229 lb (103.9 kg)   SpO2 95%   BMI 30.21 kg/m  Wt Readings from Last 3 Encounters:  07/18/19 229 lb (103.9 kg)  07/12/19 229 lb 12.8 oz (104.2 kg)  03/16/19 230 lb (104.3 kg)   Constitutional: overweight, in NAD Eyes: PERRLA, EOMI, no exophthalmos ENT: moist mucous membranes, no thyromegaly, no cervical lymphadenopathy Cardiovascular: RRR, No MRG Respiratory: CTA B Gastrointestinal: abdomen soft, NT, ND, BS+ Musculoskeletal: no deformities, strength intact in all 4 Skin: moist, warm, + bilateral stasis dermatitis Neurological: no tremor with outstretched hands, DTR normal in all 4  ASSESSMENT: 1. DM2, non-insulin-dependent, uncontrolled, without long term complications, but with hyperglycemia  2. Overweight  3. HL  PLAN:  1. Patient with longstanding, previously uncontrolled diabetes, with much improved control after starting Invokana.  He is also using metformin ER and glipizide as needed for a larger meal.  At last visit, we tried to start Januvia and switch to Rybelsus, but he could not tolerate it so we restarted Januvia.  At that time, I advised him to take the entire dose of metformin with dinner since his sugars are slightly higher than goal in the morning.  However, he could not tolerate this due to nausea.  He is now back to taking the metformin twice a day. -At this visit, sugars are similar to before, fluctuating from the upper limit of normal to slightly above this.  We discussed  about writing sugars in his log daily, rather than right before visits, so he can see patterns and cannot comments to his sugar log.  Otherwise, we will not make changes in his regimen for now but discussed about using glipizide more frequently, as needed, and adjusting dinners based on the time when he eats them (the later he can eat dinner, the lighter the meal).  -We also discussed about targets for CBGs: Aim for less than 130 before meals, however, since he is usually higher in the morning, it is okay to aim for less than 140.  At night, I advised him to aim for less than 160. - I suggested to:  Patient Instructions  Please continue: - Metformin ER 750 mg 2x a day - Invokana 100 mg before b'fast - Glipizide 5 mg as needed for a larger meal - Januvia 100 mg before b'fast  Write down comments about the unusual blood sugars.  Increase Fish oil to 1000 mg 2x a day.  Please return in 4 months with your sugar log.   - advised to check sugars at different times of the day - 2x a day, rotating check times - advised for yearly eye exams >> he is UTD - UTD with flu shot - return to clinic in 4 months    2. Overweight -He lost 10 pounds after starting Invokana, and 3 more pounds afterwards. -At last visit, we continued Invokana and also added Rybelsus, which should also help with weight loss.  However, he could not tolerate Rybelsus so he is now back on Januvia, which is weight neutral -His weight is stable since last visit  3. HL -Reviewed latest lipid panel from 06/2019: LDL at goal, triglycerides high, HDL low: Lab Results  Component Value Date   CHOL 161 07/05/2019   HDL 27.40 (L) 07/05/2019   LDLCALC 74 07/04/2018   LDLDIRECT 78.0 07/05/2019   TRIG 389.0 (H) 07/05/2019   CHOLHDL 6 07/05/2019  -Continues the statin every other day without side effects -He is also taking fish oil  but only 1000 mg daily and I advised him to increase to twice a day to improve his  triglycerides  Philemon Kingdom, MD PhD [facility]

## 2019-08-18 DIAGNOSIS — D2262 Melanocytic nevi of left upper limb, including shoulder: Secondary | ICD-10-CM | POA: Diagnosis not present

## 2019-08-18 DIAGNOSIS — L821 Other seborrheic keratosis: Secondary | ICD-10-CM | POA: Diagnosis not present

## 2019-08-18 DIAGNOSIS — L570 Actinic keratosis: Secondary | ICD-10-CM | POA: Diagnosis not present

## 2019-08-18 DIAGNOSIS — L817 Pigmented purpuric dermatosis: Secondary | ICD-10-CM | POA: Diagnosis not present

## 2019-08-18 DIAGNOSIS — D2271 Melanocytic nevi of right lower limb, including hip: Secondary | ICD-10-CM | POA: Diagnosis not present

## 2019-09-03 ENCOUNTER — Other Ambulatory Visit: Payer: Self-pay | Admitting: Internal Medicine

## 2019-10-01 ENCOUNTER — Other Ambulatory Visit: Payer: Self-pay | Admitting: Family Medicine

## 2019-10-01 DIAGNOSIS — E7801 Familial hypercholesterolemia: Secondary | ICD-10-CM

## 2019-10-02 ENCOUNTER — Encounter: Payer: Self-pay | Admitting: Family Medicine

## 2019-10-06 ENCOUNTER — Telehealth: Payer: Self-pay | Admitting: *Deleted

## 2019-10-06 ENCOUNTER — Other Ambulatory Visit: Payer: Self-pay | Admitting: Family Medicine

## 2019-10-06 DIAGNOSIS — I10 Essential (primary) hypertension: Secondary | ICD-10-CM

## 2019-10-06 MED ORDER — LOSARTAN POTASSIUM 100 MG PO TABS: 100.0000 mg | ORAL_TABLET | Freq: Every day | ORAL | 1 refills | Status: DC

## 2019-10-06 NOTE — Telephone Encounter (Signed)
[facility] faxed a refill request for Losartan 100mg -take 1 tablet by mouth every day-#100.  Message sent to Dr Ethlyn Gallery as the last Rx was given by Dr Sherren Mocha.

## 2019-10-06 NOTE — Telephone Encounter (Signed)
sent 

## 2019-10-06 NOTE — Telephone Encounter (Signed)
Noted  

## 2019-11-12 ENCOUNTER — Encounter: Payer: Self-pay | Admitting: Family Medicine

## 2019-11-14 ENCOUNTER — Ambulatory Visit: Payer: BC Managed Care – PPO

## 2019-11-23 ENCOUNTER — Ambulatory Visit: Payer: BLUE CROSS/BLUE SHIELD | Attending: Internal Medicine

## 2019-11-23 DIAGNOSIS — Z23 Encounter for immunization: Secondary | ICD-10-CM | POA: Insufficient documentation

## 2019-11-23 NOTE — Progress Notes (Signed)
   Covid-19 Vaccination Clinic  Name:  Derrick Brown    MRN: [id number] DOB: 16-Oct-1951  11/23/2019  Derrick Brown was observed post Covid-19 immunization for 15 minutes without incidence. He was provided with Vaccine Information Sheet and instruction to access the V-Safe system.   Derrick Brown was instructed to call 911 with any severe reactions post vaccine: Marland Kitchen Difficulty breathing  . Swelling of your face and throat  . A fast heartbeat  . A bad rash all over your body  . Dizziness and weakness    Immunizations Administered    Name Date Dose VIS Date Route   Pfizer COVID-19 Vaccine 11/23/2019 12:08 PM 0.3 mL 09/29/2019 Intramuscular   Manufacturer: [place]   Lot: [id number]   [place]: [id number]

## 2019-11-24 ENCOUNTER — Other Ambulatory Visit: Payer: Self-pay

## 2019-11-28 ENCOUNTER — Encounter: Payer: Self-pay | Admitting: Internal Medicine

## 2019-11-28 ENCOUNTER — Ambulatory Visit: Payer: BLUE CROSS/BLUE SHIELD | Admitting: Internal Medicine

## 2019-11-28 ENCOUNTER — Other Ambulatory Visit: Payer: Self-pay

## 2019-11-28 VITALS — BP 120/60 | HR 90 | Ht 73.0 in | Wt 237.0 lb

## 2019-11-28 DIAGNOSIS — E1165 Type 2 diabetes mellitus with hyperglycemia: Secondary | ICD-10-CM

## 2019-11-28 DIAGNOSIS — E785 Hyperlipidemia, unspecified: Secondary | ICD-10-CM

## 2019-11-28 DIAGNOSIS — E669 Obesity, unspecified: Secondary | ICD-10-CM

## 2019-11-28 LAB — POCT GLYCOSYLATED HEMOGLOBIN (HGB A1C): Hemoglobin A1C: 6.4 % — AB (ref 4.0–5.6)

## 2019-11-28 MED ORDER — GLIPIZIDE 5 MG PO TABS: 5.0000 mg | ORAL_TABLET | Freq: Every day | ORAL | 3 refills | Status: DC

## 2019-11-28 NOTE — Progress Notes (Signed)
Patient ID: TOMOTHY OLIVOS, male   DOB: December 04, 1950, 69 y.o.   MRN: [id number]   This visit occurred during the SARS-CoV-2 public health emergency.  Safety protocols were in place, including screening questions prior to the visit, additional usage of staff PPE, and extensive cleaning of exam room while observing appropriate contact time as indicated for disinfecting solutions.   HPI: DEE STEENO is a 69 y.o.-year-old male, returning for follow-up for DM2, dx in ~2007, non-insulin-dependent, now more controlled, without long term complications. Last visit 5 months ago.  Reviewed HbA1c levels: Lab Results  Component Value Date   HGBA1C 6.8 (H) 07/05/2019   HGBA1C 6.3 (A) 03/16/2019   HGBA1C 6.0 (A) 09/12/2018   Pt is on a regimen of: - Metformin ER  750 mg twice a day with meals >> 1500 mg with dinner >> nausea >> 750 mg 2x a day - Januvia 100 mg before breakfast - Invokana 100 mg in am. - Glipizide 5 mg as needed for larger meal In 02/2019 we tried Rybelsus but he could not tolerate this due to GI side effects and sugars were also more fluctuating.  We restarted Januvia afterwards.  Pt checks his sugars twice a day: - am: 119-151, 168 >> 123-154, 169 >> 111, 121-163 >> 93-147, 157 - 2h after b'fast: n/c >> 198, 200 >> n/c - before lunch: n/c - 2h after lunch: n/c - before dinner: n/c - 2h after dinner: n/c - bedtime:  128, 143-163, 177, 190, 193 (pasta) >> 119-205 >> 111-185 - nighttime: n/c Lowest sugar was 99 >> 123 >> 90 >> 93. Highest sugar was 185 >> 193 >> 205 >> 185.  Glucometer: Freestyle Lite  Pt's meals are: - Breakfast: oatmeal + blueberries + pecan; cereals; fruit juice; coffee - Lunch: chicken salad or sandwich - late - Dinner: cereal or light dinner - late - Snacks: takes snack boxes to work: yoghurt, veggies, grapes/prunes, chips/cheetos No formal exercise.  -No CKD, last BUN/creatinine:  Lab Results  Component Value Date   BUN 15 07/05/2019   BUN 13 11/30/2018    CREATININE 0.76 07/05/2019   CREATININE 0.71 11/30/2018  On losartan.  No MAU: Lab Results  Component Value Date   MICRALBCREAT 1.3 11/30/2018   MICRALBCREAT 1.4 07/04/2018   MICRALBCREAT 1.2 06/30/2017   MICRALBCREAT 1.5 07/28/2016   MICRALBCREAT 1.0 10/18/2014   MICRALBCREAT 1.1 04/23/2014   MICRALBCREAT 0.8 09/08/2013   MICRALBCREAT 0.9 03/07/2010   MICRALBCREAT 11.5 02/14/2009   -+ HL; last set of lipids: Lab Results  Component Value Date   CHOL 161 07/05/2019   HDL 27.40 (L) 07/05/2019   LDLCALC 74 07/04/2018   LDLDIRECT 78.0 07/05/2019   TRIG 389.0 (H) 07/05/2019   CHOLHDL 6 07/05/2019  On Lipitor 20 every other day.  Also, fish oil 1000 mg twice a day. - last eye exam was: 01/2019: No DR, + cataracts. Dr. Gershon Crane. - no numbness and tingling in his feet.  Latest foot exam 06/2019.  He also has a history of HTN, GERD.  ROS: Constitutional: no weight gain/no weight loss, no fatigue, no subjective hyperthermia, no subjective hypothermia Eyes: no blurry vision, no xerophthalmia ENT: no sore throat, no nodules palpated in neck, no dysphagia, no odynophagia, no hoarseness Cardiovascular: no CP/no SOB/no palpitations/no leg swelling Respiratory: no cough/no SOB/no wheezing Gastrointestinal: no N/no V/no D/no C/no acid reflux Musculoskeletal: no muscle aches/no joint aches Skin:  no hair loss, + rash on shins (tried steroidal cream which helped the rash  on 1 shin only) Neurological: no tremors/no numbness/no tingling/no dizziness  I reviewed pt's medications, allergies, PMH, social hx, family hx, and changes were documented in the history of present illness. Otherwise, unchanged from my initial visit note.  Past Medical History:  Diagnosis Date  . ACNE ROSACEA 02/21/2009  . Allergy   . COUGH DUE TO ACE INHIBITORS 04/12/2009  . Diabetes mellitus without complication ([place])    type 2  . DYSPHAGIA 05/15/2009  . ESOPHAGITIS, REFLUX 05/13/2009  . FATIGUE 03/14/2010  .  Fatty liver   . GERD (gastroesophageal reflux disease)   . HYPERGLYCEMIA 02/21/2009  . HYPERLIPIDEMIA 02/21/2009  . HYPERTENSION NEC 02/21/2009  . INSOMNIA 04/02/2010  . MUSCLE PAIN 05/13/2009  . OBSTRUCTIVE SLEEP APNEA 04/02/2010  . RENAL CALCULUS 02/21/2009  . Sleep apnea   . TEMPOROMANDIBULAR JOINT DISORDER 06/10/2009  . Unspecified hypertrophic and atrophic condition of skin    Past Surgical History:  Procedure Laterality Date  . COLONOSCOPY    . ESOPHAGOGASTRODUODENOSCOPY    . KIDNEY STONE SURGERY     removal  . TONSILLECTOMY AND ADENOIDECTOMY     Social History   Social History  . Marital status: Married    Spouse name: N/A   Social History Main Topics  . Smoking status: Never Smoker  . Smokeless tobacco: Never Used  . Alcohol use 1.7 oz/week    1 burbon  . Drug use: No   Current Outpatient Medications on File Prior to Visit  Medication Sig Dispense Refill  . amLODipine (NORVASC) 5 MG tablet Take 1 tablet (5 mg total) by mouth daily. 90 tablet 3  . aspirin 81 MG tablet Take 81 mg by mouth daily.      Marland Kitchen atorvastatin (LIPITOR) 20 MG tablet TAKE 1 TABLET EVERY OTHER DAY 100 tablet 3  . INVOKANA 100 MG TABS tablet TAKE 1 TABLET BY MOUTH EVERY DAY BEFORE BREAKFAST 90 tablet 3  . losartan (COZAAR) 100 MG tablet Take 1 tablet (100 mg total) by mouth daily. 90 tablet 1  . metFORMIN (GLUCOPHAGE-XR) 750 MG 24 hr tablet TAKE 1 TABLET BY MOUTH TWICE A DAY 180 tablet 3  . Multiple Vitamin (MULTIVITAMIN) tablet Take 1 tablet by mouth daily.      . Omega-3 Fatty Acids (FISH OIL) 1000 MG CAPS Take by mouth daily.      Marland Kitchen OVER THE COUNTER MEDICATION Vitamin C 500 mg, one tablet daily.    Marland Kitchen OVER THE COUNTER MEDICATION Cinnamon 1000 mg one tablet daily.    Marland Kitchen OVER THE COUNTER MEDICATION CQ10 one tablet daily.    . pantoprazole (PROTONIX) 40 MG tablet TAKE 1 TABLET BY MOUTH EVERY DAY 90 tablet 1  . Saw Palmetto 450 MG CAPS Take by mouth daily.    . sitaGLIPtin (JANUVIA) 100 MG tablet TAKE 1  TABLET (100 MG TOTAL) BY MOUTH DAILY. 90 tablet 3  . traZODone (DESYREL) 50 MG tablet Take 0.5-1 tablets (25-50 mg total) by mouth at bedtime as needed for sleep. 90 tablet 1  . triamcinolone cream (KENALOG) 0.1 % APPLY TO AFFECTED AREA TWICE A DAY 30 g 0   No current facility-administered medications on file prior to visit.   Allergies  Allergen Reactions  . Hctz [Hydrochlorothiazide] Hives, Itching and Rash   Family History  Problem Relation Age of Onset  . Stroke Mother 66  . Rectal cancer Sister   . Heart attack Father 83  . Healthy Brother   . Colon polyps Sister   . Heart disease  Other        family histrory  . Diabetes Paternal Grandmother   . Colon cancer Neg Hx   . Esophageal cancer Neg Hx   . Liver cancer Neg Hx   . Pancreatic cancer Neg Hx   . Stomach cancer Neg Hx    PE: BP 120/60   Pulse 90   Ht 6\' 1"  (1.854 m)   Wt 237 lb (107.5 kg)   SpO2 96%   BMI 31.27 kg/m  Wt Readings from Last 3 Encounters:  11/28/19 237 lb (107.5 kg)  07/18/19 229 lb (103.9 kg)  07/12/19 229 lb 12.8 oz (104.2 kg)   Constitutional: overweight, in NAD Eyes: PERRLA, EOMI, no exophthalmos ENT: moist mucous membranes, no thyromegaly, no cervical lymphadenopathy Cardiovascular: RRR, No MRG Respiratory: CTA B Gastrointestinal: abdomen soft, NT, ND, BS+ Musculoskeletal: no deformities, strength intact in all 4 Skin: moist, warm, + petechial rash on bilateral shins Neurological: no tremor with outstretched hands, DTR normal in all 4  ASSESSMENT: 1. DM2, non-insulin-dependent, uncontrolled, without long term complications, but with hyperglycemia  2. Obesity class 1  3. HL  PLAN:  1. Patient with longstanding, previously uncontrolled diabetes, with much improved control after starting SGLT2 inhibitor.  He is also on Metformin ER and sulfonylurea as needed before a larger meal.  We tried Rybelsus but he could not tolerate it due to nausea so we started back on Januvia.  We tried to  move the entire metformin dose at night to help with morning sugars but he could not tolerate it due to nausea.  At last visit, sugars were fluctuating from the upper limit of normal to slightly above this.  We discussed about writing in his sugar log possible reasons for high blood sugars if he could see any patterns.  We also discussed about using glipizide more frequently, but otherwise, we did not change his regimen. -At this visit, sugars are almost all at goal, with few exceptions in the morning, when sugars increased to 140-150.  He now does admit job in writing down possible reasons with high blood sugars and night and in the morning, and they are related to either dietary secretions (pizza, pie) or forgetting his medicines.  However, due to the, I do not feel we need to change his regimen. - I suggested to:  Patient Instructions  Please continue: - Metformin ER 750 mg 2x a day - Invokana 100 mg before b'fast - Glipizide 5 mg as needed before a larger meal - Januvia 100 mg before b'fast  Continue fish oil 1000 mg 2x a day.  Please return in 4 months with your sugar log.   - we checked his HbA1c: 6.4% (improved) - advised to check sugars at different times of the day - 2x a day, rotating check times - advised for yearly eye exams >> he is UTD - return to clinic in 3-4 months   2. Obesity class1 -He lost 13 pounds after starting Invokana.  We then added Rybelsus, which should also help with weight loss.  However, he could not tolerate Rybelsus and he is now back on Januvia, which is weight neutral. -He gained 8 pounds since last visit, during the holidays  3. HL -Reviewed latest lipid panel from 06/2019: LDL at goal, but triglycerides high and HDL low: Lab Results  Component Value Date   CHOL 161 07/05/2019   HDL 27.40 (L) 07/05/2019   LDLCALC 74 07/04/2018   LDLDIRECT 78.0 07/05/2019   TRIG 389.0 (H)  07/05/2019   CHOLHDL 6 07/05/2019  -Continues the statin every other day  without side effects -At last visit I advised him to increase his fish oil to 1000 mg twice a day to improve his triglycerides -He continues on the higher dose.  He needs a repeat lipid panel.  He has an appointment with PCP coming up soon.  Philemon Kingdom, MD PhD [facility]

## 2019-11-28 NOTE — Addendum Note (Signed)
Addended by: Cardell Peach I on: 11/28/2019 08:27 AM   Modules accepted: Orders

## 2019-11-28 NOTE — Patient Instructions (Signed)
Please continue: - Metformin ER 750 mg 2x a day - Invokana 100 mg before b'fast - Glipizide 5 mg as needed for a larger meal - Januvia 100 mg before b'fast  Continue fish oil 1000 mg 2x a day.  Please return in 4 months with your sugar log.

## 2019-12-05 ENCOUNTER — Ambulatory Visit: Payer: BC Managed Care – PPO

## 2019-12-06 ENCOUNTER — Other Ambulatory Visit: Payer: Self-pay | Admitting: Family Medicine

## 2019-12-08 MED ORDER — TRIAMCINOLONE ACETONIDE 0.1 % EX CREA: TOPICAL_CREAM | Freq: Two times a day (BID) | CUTANEOUS | 0 refills | Status: AC | PRN

## 2019-12-19 ENCOUNTER — Other Ambulatory Visit: Payer: Self-pay

## 2019-12-19 ENCOUNTER — Ambulatory Visit: Payer: BLUE CROSS/BLUE SHIELD | Attending: Internal Medicine

## 2019-12-19 NOTE — Progress Notes (Signed)
   Covid-19 Vaccination Clinic  Name:  Derrick Brown    MRN: [id number] DOB: 01-Jan-1951  12/19/2019  [name] was observed post Covid-19 immunization for 15 minutes without incident. He was provided with Vaccine Information Sheet and instruction to access the V-Safe system.   [name] was instructed to call 911 with any severe reactions post vaccine: Marland Kitchen Difficulty breathing  . Swelling of face and throat  . A fast heartbeat  . A bad rash all over body  . Dizziness and weakness

## 2019-12-20 ENCOUNTER — Encounter: Payer: Self-pay | Admitting: Family Medicine

## 2019-12-20 ENCOUNTER — Ambulatory Visit (INDEPENDENT_AMBULATORY_CARE_PROVIDER_SITE_OTHER): Payer: BLUE CROSS/BLUE SHIELD | Admitting: Family Medicine

## 2019-12-20 VITALS — BP 130/78 | HR 79 | Temp 97.1°F | Ht 73.0 in | Wt 236.7 lb

## 2019-12-20 DIAGNOSIS — E785 Hyperlipidemia, unspecified: Secondary | ICD-10-CM | POA: Diagnosis not present

## 2019-12-20 DIAGNOSIS — K2100 Gastro-esophageal reflux disease with esophagitis, without bleeding: Secondary | ICD-10-CM

## 2019-12-20 DIAGNOSIS — E1165 Type 2 diabetes mellitus with hyperglycemia: Secondary | ICD-10-CM | POA: Diagnosis not present

## 2019-12-20 DIAGNOSIS — I10 Essential (primary) hypertension: Secondary | ICD-10-CM | POA: Diagnosis not present

## 2019-12-20 NOTE — Progress Notes (Signed)
Derrick Brown DOB: 12-29-1950 Encounter date: 12/20/2019  This is a 69 y.o. male who presents with Chief Complaint  Patient presents with  . Follow-up    History of present illness: No specific concerns today. Had second COVID shot yesterday.   HTN: Amlodipine, losartan. Not checking at home.  OSA: doesn't use cpap and hasn't in years. When diagnosed his weight was up. Feels that sleeping was better with weight loss. DMII: Follows with endocrinology, Dr. Renne Crigler.  A1c well controlled at 6.4.  Metformin, Invokana, glipizide, and Januvia. GERD: Protonix 40 mg. Takes the protonix every other day.  Hyperlipidemia: Statin every other day, endocrinology encouraged fish oil last visit.   Follows regularly with dermatology Repeat colonoscopy due 01/2023  Right leg still has anterior rash. Dermatologist stated might have to live with it. Uses steroid cream.   Allergies  Allergen Reactions  . Hctz [Hydrochlorothiazide] Hives, Itching and Rash   Current Meds  Medication Sig  . amLODipine (NORVASC) 5 MG tablet Take 1 tablet (5 mg total) by mouth daily.  Marland Kitchen aspirin 81 MG tablet Take 81 mg by mouth daily.    Marland Kitchen atorvastatin (LIPITOR) 20 MG tablet TAKE 1 TABLET EVERY OTHER DAY  . glipiZIDE (GLUCOTROL) 5 MG tablet Take 1 tablet (5 mg total) by mouth daily before supper.  . INVOKANA 100 MG TABS tablet TAKE 1 TABLET BY MOUTH EVERY DAY BEFORE BREAKFAST  . losartan (COZAAR) 100 MG tablet Take 1 tablet (100 mg total) by mouth daily.  . metFORMIN (GLUCOPHAGE-XR) 750 MG 24 hr tablet TAKE 1 TABLET BY MOUTH TWICE A DAY  . Multiple Vitamin (MULTIVITAMIN) tablet Take 1 tablet by mouth daily.    . Omega-3 Fatty Acids (FISH OIL) 1000 MG CAPS Take 1,000 mg by mouth 2 (two) times daily.    Marland Kitchen OVER THE COUNTER MEDICATION Vitamin C 500 mg, one tablet daily.  Marland Kitchen OVER THE COUNTER MEDICATION Cinnamon 1000 mg one tablet daily.  Marland Kitchen OVER THE COUNTER MEDICATION CQ10 one tablet daily.  . pantoprazole (PROTONIX) 40 MG tablet  TAKE 1 TABLET BY MOUTH EVERY DAY  . Saw Palmetto 450 MG CAPS Take by mouth daily.  . sitaGLIPtin (JANUVIA) 100 MG tablet TAKE 1 TABLET (100 MG TOTAL) BY MOUTH DAILY.  . traZODone (DESYREL) 50 MG tablet Take 0.5-1 tablets (25-50 mg total) by mouth at bedtime as needed for sleep.  Marland Kitchen triamcinolone cream (KENALOG) 0.1 % Apply topically 2 (two) times daily as needed.    Review of Systems  Constitutional: Negative for chills, fatigue and fever.  Respiratory: Negative for cough, chest tightness, shortness of breath and wheezing.   Cardiovascular: Negative for chest pain, palpitations and leg swelling.    Objective:  BP 130/78 (BP Location: Left Arm, Patient Position: Sitting, Cuff Size: Large)   Pulse 79   Temp (!) 97.1 F (36.2 C) (Temporal)   Ht 6\' 1"  (1.854 m)   Wt 236 lb 11.2 oz (107.4 kg)   SpO2 98%   BMI 31.23 kg/m   Weight: 236 lb 11.2 oz (107.4 kg)   BP Readings from Last 3 Encounters:  12/20/19 130/78  11/28/19 120/60  07/18/19 132/60   Wt Readings from Last 3 Encounters:  12/20/19 236 lb 11.2 oz (107.4 kg)  11/28/19 237 lb (107.5 kg)  07/18/19 229 lb (103.9 kg)    Physical Exam Constitutional:      General: He is not in acute distress.    Appearance: He is well-developed.  HENT:     Head:  Normocephalic and atraumatic.  Cardiovascular:     Rate and Rhythm: Normal rate and regular rhythm.     Heart sounds: Normal heart sounds. No murmur.  Pulmonary:     Effort: Pulmonary effort is normal.     Breath sounds: Normal breath sounds.  Abdominal:     General: Bowel sounds are normal. There is no distension.     Palpations: Abdomen is soft.     Tenderness: There is no abdominal tenderness. There is no guarding.  Skin:    General: Skin is warm and dry.     Comments: Sensory exam of the foot is normal, tested with the monofilament. Good pulses, no lesions or ulcers, good peripheral pulses.  Psychiatric:        Judgment: Judgment normal.     Assessment/Plan  1.  Essential hypertension Well controlled.  - CBC with Differential/Platelet; Future - Comprehensive metabolic panel; Future  2. Gastroesophageal reflux disease with esophagitis, unspecified whether hemorrhage Decrease protonix to 20mg  every other day. Try to cut out completely if tolerating.   3. Type 2 diabetes mellitus with hyperglycemia, without long-term current use of insulin (HCC) Well controlled. Continue current medications. - Hemoglobin A1c; Future - Microalbumin / creatinine urine ratio; Future  4. Hyperlipidemia, unspecified hyperlipidemia type TG have been high. Increase fish oil to 3-4 g daily; discussed working on healthy eating to help lower TG. Repeat bloodwork around May and can further address pending those results. - Lipid panel; Future    Return in about 6 months (around 06/21/2020) for physical exam.    Blood work in May. Micheline Rough, MD

## 2019-12-20 NOTE — Patient Instructions (Signed)
Consider increasing fish oil 3-4 grams daily

## 2020-01-05 ENCOUNTER — Encounter: Payer: Self-pay | Admitting: Family Medicine

## 2020-01-05 MED ORDER — BETAMETHASONE DIPROPIONATE 0.05 % EX CREA: TOPICAL_CREAM | Freq: Two times a day (BID) | CUTANEOUS | 0 refills | Status: DC

## 2020-01-09 ENCOUNTER — Encounter: Payer: Self-pay | Admitting: Internal Medicine

## 2020-01-10 ENCOUNTER — Other Ambulatory Visit: Payer: Self-pay

## 2020-01-10 MED ORDER — CANAGLIFLOZIN 300 MG PO TABS: ORAL_TABLET | ORAL | 3 refills | Status: DC

## 2020-01-16 ENCOUNTER — Other Ambulatory Visit: Payer: Self-pay

## 2020-01-16 MED ORDER — CANAGLIFLOZIN 100 MG PO TABS: 100.0000 mg | ORAL_TABLET | Freq: Every day | ORAL | 3 refills | Status: DC

## 2020-01-17 ENCOUNTER — Encounter: Payer: Self-pay | Admitting: Family Medicine

## 2020-02-02 ENCOUNTER — Other Ambulatory Visit: Payer: Self-pay | Admitting: Family Medicine

## 2020-02-05 MED ORDER — BETAMETHASONE DIPROPIONATE 0.05 % EX CREA: TOPICAL_CREAM | Freq: Two times a day (BID) | CUTANEOUS | 0 refills | Status: DC

## 2020-02-14 ENCOUNTER — Other Ambulatory Visit: Payer: Self-pay | Admitting: Family Medicine

## 2020-02-20 ENCOUNTER — Other Ambulatory Visit: Payer: BLUE CROSS/BLUE SHIELD

## 2020-02-21 ENCOUNTER — Encounter: Payer: Self-pay | Admitting: Family Medicine

## 2020-02-21 ENCOUNTER — Other Ambulatory Visit: Payer: Self-pay | Admitting: Family Medicine

## 2020-02-21 DIAGNOSIS — E7801 Familial hypercholesterolemia: Secondary | ICD-10-CM

## 2020-02-21 MED ORDER — ATORVASTATIN CALCIUM 20 MG PO TABS: ORAL_TABLET | ORAL | 3 refills | Status: DC

## 2020-02-22 ENCOUNTER — Other Ambulatory Visit: Payer: Self-pay

## 2020-02-23 ENCOUNTER — Other Ambulatory Visit (INDEPENDENT_AMBULATORY_CARE_PROVIDER_SITE_OTHER): Payer: BLUE CROSS/BLUE SHIELD

## 2020-02-23 DIAGNOSIS — E7801 Familial hypercholesterolemia: Secondary | ICD-10-CM

## 2020-02-23 DIAGNOSIS — E1165 Type 2 diabetes mellitus with hyperglycemia: Secondary | ICD-10-CM

## 2020-02-23 DIAGNOSIS — I10 Essential (primary) hypertension: Secondary | ICD-10-CM | POA: Diagnosis not present

## 2020-02-23 DIAGNOSIS — E785 Hyperlipidemia, unspecified: Secondary | ICD-10-CM | POA: Diagnosis not present

## 2020-02-23 LAB — LDL CHOLESTEROL, DIRECT: Direct LDL: 83 mg/dL

## 2020-02-23 LAB — COMPREHENSIVE METABOLIC PANEL
ALT: 42 U/L (ref 0–53)
AST: 22 U/L (ref 0–37)
Albumin: 4.5 g/dL (ref 3.5–5.2)
Alkaline Phosphatase: 82 U/L (ref 39–117)
BUN: 16 mg/dL (ref 6–23)
CO2: 24 mEq/L (ref 19–32)
Calcium: 9.7 mg/dL (ref 8.4–10.5)
Chloride: 103 mEq/L (ref 96–112)
Creatinine, Ser: 0.75 mg/dL (ref 0.40–1.50)
GFR: 103.87 mL/min (ref >60.00)
Glucose, Bld: 185 mg/dL — ABNORMAL HIGH (ref 70–99)
Potassium: 3.8 mEq/L (ref 3.5–5.1)
Sodium: 137 mEq/L (ref 135–145)
Total Bilirubin: 0.6 mg/dL (ref 0.2–1.2)
Total Protein: 6.6 g/dL (ref 6.0–8.3)

## 2020-02-23 LAB — CBC WITH DIFFERENTIAL/PLATELET
Basophils Absolute: 0.1 10*3/uL (ref 0.0–0.1)
Basophils Relative: 1.2 % (ref 0.0–3.0)
Eosinophils Absolute: 0.4 10*3/uL (ref 0.0–0.7)
Eosinophils Relative: 5.9 % — ABNORMAL HIGH (ref 0.0–5.0)
HCT: 45.3 % (ref 39.0–52.0)
Hemoglobin: 15.8 g/dL (ref 13.0–17.0)
Lymphocytes Relative: 36.2 % (ref 12.0–46.0)
Lymphs Abs: 2.4 10*3/uL (ref 0.7–4.0)
MCHC: 34.9 g/dL (ref 30.0–36.0)
MCV: 92.1 fl (ref 78.0–100.0)
Monocytes Absolute: 0.6 10*3/uL (ref 0.1–1.0)
Monocytes Relative: 8.2 % (ref 3.0–12.0)
Neutro Abs: 3.3 10*3/uL (ref 1.4–7.7)
Neutrophils Relative %: 48.5 % (ref 43.0–77.0)
Platelets: 220 10*3/uL (ref 150.0–400.0)
RBC: 4.92 Mil/uL (ref 4.22–5.81)
RDW: 13.9 % (ref 11.5–15.5)
WBC: 6.8 10*3/uL (ref 4.0–10.5)

## 2020-02-23 LAB — LIPID PANEL
Cholesterol: 162 mg/dL (ref 0–200)
HDL: 24.7 mg/dL — ABNORMAL LOW (ref >39.00)
NonHDL: 137.14
Total CHOL/HDL Ratio: 7
Triglycerides: 383 mg/dL — ABNORMAL HIGH (ref 0.0–149.0)
VLDL: 76.6 mg/dL — ABNORMAL HIGH (ref 0.0–40.0)

## 2020-02-23 LAB — HEMOGLOBIN A1C: Hgb A1c MFr Bld: 6.6 % — ABNORMAL HIGH (ref 4.6–6.5)

## 2020-02-23 LAB — MICROALBUMIN / CREATININE URINE RATIO
Creatinine,U: 94.9 mg/dL
Microalb Creat Ratio: 1.3 mg/g (ref 0.0–30.0)
Microalb, Ur: 1.2 mg/dL (ref 0.0–1.9)

## 2020-03-09 ENCOUNTER — Encounter: Payer: Self-pay | Admitting: Internal Medicine

## 2020-03-11 ENCOUNTER — Encounter: Payer: Self-pay | Admitting: Internal Medicine

## 2020-03-12 ENCOUNTER — Encounter: Payer: Self-pay | Admitting: Internal Medicine

## 2020-03-12 ENCOUNTER — Other Ambulatory Visit: Payer: Self-pay | Admitting: Internal Medicine

## 2020-03-12 DIAGNOSIS — M65331 Trigger finger, right middle finger: Secondary | ICD-10-CM | POA: Diagnosis not present

## 2020-03-12 DIAGNOSIS — M65312 Trigger thumb, left thumb: Secondary | ICD-10-CM | POA: Diagnosis not present

## 2020-03-12 MED ORDER — DAPAGLIFLOZIN PROPANEDIOL 10 MG PO TABS: 10.0000 mg | ORAL_TABLET | Freq: Every day | ORAL | 3 refills | Status: DC

## 2020-03-17 ENCOUNTER — Other Ambulatory Visit: Payer: Self-pay | Admitting: Internal Medicine

## 2020-03-26 ENCOUNTER — Other Ambulatory Visit: Payer: Self-pay | Admitting: Family Medicine

## 2020-03-26 DIAGNOSIS — E7801 Familial hypercholesterolemia: Secondary | ICD-10-CM

## 2020-03-28 ENCOUNTER — Encounter: Payer: Self-pay | Admitting: Internal Medicine

## 2020-03-28 DIAGNOSIS — H2513 Age-related nuclear cataract, bilateral: Secondary | ICD-10-CM | POA: Diagnosis not present

## 2020-03-28 DIAGNOSIS — E119 Type 2 diabetes mellitus without complications: Secondary | ICD-10-CM | POA: Diagnosis not present

## 2020-03-28 DIAGNOSIS — H02824 Cysts of left upper eyelid: Secondary | ICD-10-CM | POA: Diagnosis not present

## 2020-03-28 DIAGNOSIS — Z7984 Long term (current) use of oral hypoglycemic drugs: Secondary | ICD-10-CM | POA: Diagnosis not present

## 2020-03-28 LAB — HM DIABETES EYE EXAM

## 2020-04-02 ENCOUNTER — Encounter: Payer: Self-pay | Admitting: Internal Medicine

## 2020-04-02 ENCOUNTER — Ambulatory Visit (INDEPENDENT_AMBULATORY_CARE_PROVIDER_SITE_OTHER): Payer: BLUE CROSS/BLUE SHIELD | Admitting: Internal Medicine

## 2020-04-02 VITALS — BP 110/70 | HR 97 | Ht 73.0 in | Wt 232.0 lb

## 2020-04-02 DIAGNOSIS — E1165 Type 2 diabetes mellitus with hyperglycemia: Secondary | ICD-10-CM | POA: Diagnosis not present

## 2020-04-02 DIAGNOSIS — E785 Hyperlipidemia, unspecified: Secondary | ICD-10-CM

## 2020-04-02 DIAGNOSIS — E669 Obesity, unspecified: Secondary | ICD-10-CM

## 2020-04-02 NOTE — Progress Notes (Signed)
Patient ID: Derrick Brown, male   DOB: 1951-05-06, 69 y.o.   MRN: [id number]   This visit occurred during the SARS-CoV-2 public health emergency.  Safety protocols were in place, including screening questions prior to the visit, additional usage of staff PPE, and extensive cleaning of exam room while observing appropriate contact time as indicated for disinfecting solutions.   HPI: Derrick Brown is a 69 y.o.-year-old male, returning for follow-up for DM2, dx in ~2007, non-insulin-dependent, now more controlled, without long term complications. Last visit 4 months ago.  Reviewed HbA1c levels: Lab Results  Component Value Date   HGBA1C 6.6 (H) 02/23/2020   HGBA1C 6.4 (A) 11/28/2019   HGBA1C 6.8 (H) 07/05/2019   Pt is on a regimen of: - Metformin ER  750 mg twice a day with meals >> 1500 mg with dinner >> nausea >> 750 mg twice a day - Januvia 100 mg before breakfast - Invokana 100  mg before breakfast (which is secondary insurance, this is not free for him) - Glipizide 5 mg as needed for larger meal - once a week In 02/2019 we tried Rybelsus but we had to stop due to GI side effects and sugars were also more fluctuating.  We restarted Januvia afterwards.  Pt checks his sugars twice a day: - am: 123-154, 169 >> 111, 121-163 >> 93-147, 157>> 93-150, 153 - 2h after b'fast: n/c >> 198, 200 >> n/c - before lunch: n/c - 2h after lunch: n/c - before dinner: n/c - 2h after dinner: n/c - bedtime: 128, 143-163, 177, 190, 193 >> 119-205 >> 111-185 >> 103-183, 228 - nighttime: n/c Lowest sugar was 99 >> 123 >> 90 >> 93 >> 93. Highest sugar was 185 >> 193 >> 205 >> 185 >> 228.  Glucometer: Freestyle Lite  Pt's meals are: - Breakfast: oatmeal + blueberries + pecan; cereals; fruit juice; coffee - Lunch: chicken salad or sandwich - late - Dinner: cereal or light dinner - late >> eating out more - Snacks: takes snack boxes to work: yoghurt, veggies, grapes/prunes, chips/cheetos No formal  exercise.  -No CKD, last BUN/creatinine:  Lab Results  Component Value Date   BUN 16 02/23/2020   BUN 15 07/05/2019   CREATININE 0.75 02/23/2020   CREATININE 0.76 07/05/2019  On losartan.  No MAU: Lab Results  Component Value Date   MICRALBCREAT 1.3 02/23/2020   MICRALBCREAT 1.3 11/30/2018   MICRALBCREAT 1.4 07/04/2018   MICRALBCREAT 1.2 06/30/2017   MICRALBCREAT 1.5 07/28/2016   MICRALBCREAT 1.0 10/18/2014   MICRALBCREAT 1.1 04/23/2014   MICRALBCREAT 0.8 09/08/2013   MICRALBCREAT 0.9 03/07/2010   MICRALBCREAT 11.5 02/14/2009   -+HL; last set of lipids: Lab Results  Component Value Date   CHOL 162 02/23/2020   CHOL 161 07/05/2019   CHOL 149 11/30/2018   Lab Results  Component Value Date   HDL 24.70 (L) 02/23/2020   HDL 27.40 (L) 07/05/2019   HDL 26.90 (L) 11/30/2018   Lab Results  Component Value Date   LDLCALC 74 07/04/2018   LDLCALC 75 09/08/2013   LDLCALC 70 11/07/2012   Lab Results  Component Value Date   TRIG 383.0 (H) 02/23/2020   TRIG 389.0 (H) 07/05/2019   TRIG 308.0 (H) 11/30/2018   Lab Results  Component Value Date   CHOLHDL 7 02/23/2020   CHOLHDL 6 07/05/2019   CHOLHDL 6 11/30/2018   Lab Results  Component Value Date   LDLDIRECT 83.0 02/23/2020   LDLDIRECT 78.0 07/05/2019   LDLDIRECT 84.0  11/30/2018  On Lipitor 20 every other day and fish oil 2000 mg twice a day. - last eye exam was: 03/2020: No DR, + incipient cataracts. Dr. Gershon Crane. - no numbness and tingling in his feet.  Latest foot exam 06/2019.  He also has a history of HTN, GERD.  ROS: Constitutional: no weight gain/no weight loss, no fatigue, no subjective hyperthermia, no subjective hypothermia Eyes: no blurry vision, no xerophthalmia ENT: no sore throat, no nodules palpated in neck, no dysphagia, no odynophagia, no hoarseness Cardiovascular: no CP/no SOB/no palpitations/no leg swelling Respiratory: no cough/no SOB/no wheezing Gastrointestinal: no N/no V/no D/no C/no acid  reflux Musculoskeletal: no muscle aches/no joint aches Skin: no rashes, no hair loss Neurological: no tremors/no numbness/no tingling/no dizziness  I reviewed pt's medications, allergies, PMH, social hx, family hx, and changes were documented in the history of present illness. Otherwise, unchanged from my initial visit note.  Past Medical History:  Diagnosis Date  . ACNE ROSACEA 02/21/2009  . Allergy   . COUGH DUE TO ACE INHIBITORS 04/12/2009  . Diabetes mellitus without complication ([place])    type 2  . DYSPHAGIA 05/15/2009  . ESOPHAGITIS, REFLUX 05/13/2009  . FATIGUE 03/14/2010  . Fatty liver   . GERD (gastroesophageal reflux disease)   . HYPERGLYCEMIA 02/21/2009  . HYPERLIPIDEMIA 02/21/2009  . HYPERTENSION NEC 02/21/2009  . INSOMNIA 04/02/2010  . MUSCLE PAIN 05/13/2009  . OBSTRUCTIVE SLEEP APNEA 04/02/2010  . RENAL CALCULUS 02/21/2009  . Sleep apnea   . TEMPOROMANDIBULAR JOINT DISORDER 06/10/2009  . Unspecified hypertrophic and atrophic condition of skin    Past Surgical History:  Procedure Laterality Date  . COLONOSCOPY    . ESOPHAGOGASTRODUODENOSCOPY    . KIDNEY STONE SURGERY     removal  . TONSILLECTOMY AND ADENOIDECTOMY     Social History   Social History  . Marital status: Married    Spouse name: N/A   Social History Main Topics  . Smoking status: Never Smoker  . Smokeless tobacco: Never Used  . Alcohol use 1.7 oz/week    1 burbon  . Drug use: No   Current Outpatient Medications on File Prior to Visit  Medication Sig Dispense Refill  . amLODipine (NORVASC) 5 MG tablet Take 1 tablet (5 mg total) by mouth daily. 90 tablet 3  . aspirin 81 MG tablet Take 81 mg by mouth daily.      Marland Kitchen atorvastatin (LIPITOR) 20 MG tablet TAKE 1 TABLET EVERY OTHER DAY 90 tablet 3  . betamethasone dipropionate 0.05 % cream Apply topically 2 (two) times daily. 30 g 0  . canagliflozin (INVOKANA) 100 MG TABS tablet Take 1 tablet (100 mg total) by mouth daily before breakfast. 90 tablet 3  .  dapagliflozin propanediol (FARXIGA) 10 MG TABS tablet Take 1 tablet (10 mg total) by mouth daily. 90 tablet 3  . glipiZIDE (GLUCOTROL) 5 MG tablet Take 1 tablet (5 mg total) by mouth daily before supper. 90 tablet 3  . losartan (COZAAR) 100 MG tablet Take 1 tablet (100 mg total) by mouth daily. 90 tablet 1  . metFORMIN (GLUCOPHAGE-XR) 750 MG 24 hr tablet TAKE 1 TABLET BY MOUTH TWICE A DAY 180 tablet 3  . Multiple Vitamin (MULTIVITAMIN) tablet Take 1 tablet by mouth daily.      . Omega-3 Fatty Acids (FISH OIL) 1000 MG CAPS Take 1,000 mg by mouth 2 (two) times daily.      Marland Kitchen OVER THE COUNTER MEDICATION Vitamin C 500 mg, one tablet daily.    Marland Kitchen  OVER THE COUNTER MEDICATION Cinnamon 1000 mg one tablet daily.    Marland Kitchen OVER THE COUNTER MEDICATION CQ10 one tablet daily.    . pantoprazole (PROTONIX) 40 MG tablet TAKE 1 TABLET BY MOUTH EVERY DAY 90 tablet 1  . Saw Palmetto 450 MG CAPS Take by mouth daily.    . sitaGLIPtin (JANUVIA) 100 MG tablet TAKE 1 TABLET (100 MG TOTAL) BY MOUTH DAILY. 90 tablet 3  . traZODone (DESYREL) 50 MG tablet TAKE 1/2-1 TABLET BY MOUTH AT BEDTIME AS NEEDED FOR SLEEP. 90 tablet 1  . triamcinolone cream (KENALOG) 0.1 % Apply topically 2 (two) times daily as needed. 30 g 0   No current facility-administered medications on file prior to visit.   Allergies  Allergen Reactions  . Hctz [Hydrochlorothiazide] Hives, Itching and Rash   Family History  Problem Relation Age of Onset  . Stroke Mother 63  . Rectal cancer Sister   . Heart attack Father 64  . Healthy Brother   . Colon polyps Sister   . Heart disease Other        family histrory  . Diabetes Paternal Grandmother   . Colon cancer Neg Hx   . Esophageal cancer Neg Hx   . Liver cancer Neg Hx   . Pancreatic cancer Neg Hx   . Stomach cancer Neg Hx    PE: BP 110/70   Pulse 97   Ht 6\' 1"  (1.854 m)   Wt 232 lb (105.2 kg)   SpO2 99%   BMI 30.61 kg/m  Wt Readings from Last 3 Encounters:  04/02/20 232 lb (105.2 kg)   12/20/19 236 lb 11.2 oz (107.4 kg)  11/28/19 237 lb (107.5 kg)   Constitutional: overweight, in NAD Eyes: PERRLA, EOMI, no exophthalmos ENT: moist mucous membranes, no thyromegaly, no cervical lymphadenopathy Cardiovascular: RRR, No MRG Respiratory: CTA B Gastrointestinal: abdomen soft, NT, ND, BS+ Musculoskeletal: no deformities, strength intact in all 4 Skin: moist, warm, no rashes Neurological: no tremor with outstretched hands, DTR normal in all 4  ASSESSMENT: 1. DM2, non-insulin-dependent, uncontrolled, without long term complications, but with hyperglycemia  2. Obesity class 1  3. HL  PLAN:  1. Patient with longstanding, previously uncontrolled diabetes, with much improved control after starting SGLT2 inhibitor.  He is also on Metformin ER and sulfonylurea as needed before a larger meal.  We tried Rybelsus but he could not tolerate it due to nausea so we switched back to Januvia.  We also try to move the entire metformin dose at night to help with morning sugars but he could not tolerate it due to nausea.  At last visit, sugars were almost all at goal, with few exceptions in the morning, when sugars were in the 140s up to 150.  These were due to dietary indiscretions or forgetting his medicines.  We did not change his regimen at that time. -We reviewed together his most recent HbA1c from last month, which was still very good but higher than before, at 6.6% -Since last visit, he is eating out more at dinnertime and sugars are slightly higher at bedtime and subsequently in the morning.  Upon questioning, he is taking glipizide at bedtime, his sugars are high, rather than before a large dinner.  We discussed about the importance and the rationale for taking the glipizide before, rather than after a larger meal.  Other than this, I do not think we need changes in his regimen.  However, I did advise him to try to eat out less,  which would benefit both his diabetes and his lipids - I  suggested to:  Patient Instructions  Please continue: - Metformin ER 750 mg 2x a day - Invokana 100 mg before b'fast - Januvia 100 mg before b'fast  Take: - Glipizide 5 mg as needed before a larger meal  Continue fish oil 2000 mg 2x a day.  Please return in 4 months with your sugar log.   - advised to check sugars at different times of the day - 1x a day, rotating check times - advised for yearly eye exams >> he is UTD - return to clinic in 3-4 months   2. Obesity class 1 -He lost 13 pounds after starting Invokana, but then gained 8 pounds over the holidays. -We tried Rybelsus which should have helped with weight loss, however, he could not tolerate this and he is now back on Januvia, which is weight neutral -Since last visit, he lost 5 lbs  3. HL -Reviewed latest lipid panel from last month: LDL at goal, triglycerides high, HDL low: Lab Results  Component Value Date   CHOL 162 02/23/2020   HDL 24.70 (L) 02/23/2020   LDLCALC 74 07/04/2018   LDLDIRECT 83.0 02/23/2020   TRIG 383.0 (H) 02/23/2020   CHOLHDL 7 02/23/2020  -He continues on Lipitor 20 every other day without side effects -We increased fish oil to 2000 mg twice a day to improve his triglycerides.  His triglycerides, however, did not improve after increasing fish oil.  Discussed about the improvement of reducing fatty foods, by eating out less.  Philemon Kingdom, MD PhD [facility] Endocrinology

## 2020-04-02 NOTE — Patient Instructions (Signed)
Please continue: - Metformin ER 750 mg 2x a day - Invokana 100 mg before b'fast - Januvia 100 mg before b'fast  Take: - Glipizide 5 mg as needed before a larger meal  Continue fish oil 2000 mg 2x a day.  Please return in 4 months with your sugar log.

## 2020-04-03 ENCOUNTER — Other Ambulatory Visit: Payer: Self-pay | Admitting: Family Medicine

## 2020-04-03 DIAGNOSIS — I10 Essential (primary) hypertension: Secondary | ICD-10-CM

## 2020-04-09 DIAGNOSIS — H02824 Cysts of left upper eyelid: Secondary | ICD-10-CM | POA: Diagnosis not present

## 2020-04-16 DIAGNOSIS — M65312 Trigger thumb, left thumb: Secondary | ICD-10-CM | POA: Diagnosis not present

## 2020-04-26 DIAGNOSIS — M65312 Trigger thumb, left thumb: Secondary | ICD-10-CM | POA: Diagnosis not present

## 2020-05-02 DIAGNOSIS — M25532 Pain in left wrist: Secondary | ICD-10-CM | POA: Diagnosis not present

## 2020-05-17 ENCOUNTER — Other Ambulatory Visit: Payer: Self-pay | Admitting: Internal Medicine

## 2020-06-10 ENCOUNTER — Other Ambulatory Visit: Payer: Self-pay | Admitting: Family Medicine

## 2020-07-15 ENCOUNTER — Ambulatory Visit: Payer: BLUE CROSS/BLUE SHIELD | Admitting: Family Medicine

## 2020-07-15 ENCOUNTER — Other Ambulatory Visit: Payer: Self-pay

## 2020-07-15 ENCOUNTER — Other Ambulatory Visit: Payer: Self-pay | Admitting: Family Medicine

## 2020-07-15 ENCOUNTER — Encounter: Payer: Self-pay | Admitting: Family Medicine

## 2020-07-15 VITALS — BP 122/72 | HR 87 | Temp 98.0°F | Ht 73.25 in | Wt 237.9 lb

## 2020-07-15 DIAGNOSIS — K2100 Gastro-esophageal reflux disease with esophagitis, without bleeding: Secondary | ICD-10-CM | POA: Diagnosis not present

## 2020-07-15 DIAGNOSIS — Z0000 Encounter for general adult medical examination without abnormal findings: Secondary | ICD-10-CM | POA: Diagnosis not present

## 2020-07-15 DIAGNOSIS — E1165 Type 2 diabetes mellitus with hyperglycemia: Secondary | ICD-10-CM | POA: Diagnosis not present

## 2020-07-15 DIAGNOSIS — I10 Essential (primary) hypertension: Secondary | ICD-10-CM

## 2020-07-15 DIAGNOSIS — E785 Hyperlipidemia, unspecified: Secondary | ICD-10-CM | POA: Diagnosis not present

## 2020-07-15 DIAGNOSIS — R232 Flushing: Secondary | ICD-10-CM | POA: Diagnosis not present

## 2020-07-15 MED ORDER — CANAGLIFLOZIN 100 MG PO TABS
100.0000 mg | ORAL_TABLET | Freq: Every day | ORAL | 3 refills | Status: DC
Start: 2020-07-15 — End: 2020-07-27

## 2020-07-15 NOTE — Progress Notes (Signed)
Derrick Brown DOB: [birth year] Encounter date: 07/15/2020  This is a 69 y.o. male who presents for complete physical   History of present illness/Additional concerns: Last visit with me was March/2021.  Last visit with endocrinology in June.  Got shingles vaccine last Friday. And flu shot.  HTN: Amlodipine, losartan. Not checking at home. Still running well.   DMII: Follows with endocrinology, Dr. Renne Crigler.  A1c well controlled below 7 for the last year. Metformin, farxiga, glipizide, and Januvia. No low sugars.   GERD: Protonix 40 mg. Takes the protonix every other day. No issues with this. If he eats spicy foods then makes sure he takes medication.    Hyperlipidemia: Statin (Lipitor 20 mg) every other day, endocrinology encouraged fish oil last visit.  He does follow regularly with [facility] dermatology  Last colonoscopy was in 01/2018.  Repeat suggested in 5 years (01/2023)   Past Medical History:  Diagnosis Date  . ACNE ROSACEA 02/21/2009  . Allergy   . COUGH DUE TO ACE INHIBITORS 04/12/2009  . Diabetes mellitus without complication ([place])    type 2  . DYSPHAGIA 05/15/2009  . ESOPHAGITIS, REFLUX 05/13/2009  . FATIGUE 03/14/2010  . Fatty liver   . GERD (gastroesophageal reflux disease)   . HYPERGLYCEMIA 02/21/2009  . HYPERLIPIDEMIA 02/21/2009  . HYPERTENSION NEC 02/21/2009  . INSOMNIA 04/02/2010  . MUSCLE PAIN 05/13/2009  . OBSTRUCTIVE SLEEP APNEA 04/02/2010  . RENAL CALCULUS 02/21/2009  . Sleep apnea   . TEMPOROMANDIBULAR JOINT DISORDER 06/10/2009  . Unspecified hypertrophic and atrophic condition of skin    Past Surgical History:  Procedure Laterality Date  . COLONOSCOPY    . ESOPHAGOGASTRODUODENOSCOPY    . KIDNEY STONE SURGERY     removal  . TONSILLECTOMY AND ADENOIDECTOMY     Allergies  Allergen Reactions  . Hctz [Hydrochlorothiazide] Hives, Itching and Rash   Current Meds  Medication Sig  . amLODipine (NORVASC) 5 MG tablet TAKE 1 TABLET BY MOUTH EVERY DAY  . aspirin 81 MG  tablet Take 81 mg by mouth daily.    Marland Kitchen atorvastatin (LIPITOR) 20 MG tablet TAKE 1 TABLET EVERY OTHER DAY  . betamethasone dipropionate 0.05 % cream Apply topically 2 (two) times daily.  . dapagliflozin propanediol (FARXIGA) 10 MG TABS tablet Take 1 tablet (10 mg total) by mouth daily.  Marland Kitchen glipiZIDE (GLUCOTROL) 5 MG tablet Take 1 tablet (5 mg total) by mouth daily before supper.  . losartan (COZAAR) 100 MG tablet TAKE 1 TABLET BY MOUTH EVERY DAY  . metFORMIN (GLUCOPHAGE-XR) 750 MG 24 hr tablet TAKE 1 TABLET BY MOUTH TWICE A DAY  . Multiple Vitamin (MULTIVITAMIN) tablet Take 1 tablet by mouth daily.    . Omega-3 Fatty Acids (FISH OIL) 1000 MG CAPS Take 1,000 mg by mouth 2 (two) times daily.    Marland Kitchen OVER THE COUNTER MEDICATION Vitamin C 500 mg, one tablet daily.  Marland Kitchen OVER THE COUNTER MEDICATION Cinnamon 1000 mg one tablet daily.  Marland Kitchen OVER THE COUNTER MEDICATION CQ10 one tablet daily.  . pantoprazole (PROTONIX) 40 MG tablet TAKE 1 TABLET BY MOUTH EVERY DAY  . Saw Palmetto 450 MG CAPS Take by mouth daily.  . sitaGLIPtin (JANUVIA) 100 MG tablet TAKE 1 TABLET BY MOUTH EVERY DAY  . traZODone (DESYREL) 50 MG tablet TAKE 1/2-1 TABLET BY MOUTH AT BEDTIME AS NEEDED FOR SLEEP.  Marland Kitchen triamcinolone cream (KENALOG) 0.1 % Apply topically 2 (two) times daily as needed.  . [DISCONTINUED] canagliflozin (INVOKANA) 100 MG TABS tablet Take 1 tablet (  100 mg total) by mouth daily before breakfast.   Social History   Tobacco Use  . Smoking status: Never Smoker  . Smokeless tobacco: Never Used  Substance Use Topics  . Alcohol use: Yes    Alcohol/week: 3.0 standard drinks    Types: 2 Glasses of wine, 1 Standard drinks or equivalent per week   Family History  Problem Relation Age of Onset  . Stroke Mother 42  . Rectal cancer Sister   . Heart attack Father 79  . Healthy Brother   . Colon polyps Sister   . Heart disease Other        family histrory  . Diabetes Paternal Grandmother   . Colon cancer Neg Hx   .  Esophageal cancer Neg Hx   . Liver cancer Neg Hx   . Pancreatic cancer Neg Hx   . Stomach cancer Neg Hx      Review of Systems  Constitutional: Negative for activity change, appetite change, chills, fatigue, fever and unexpected weight change.  HENT: Negative for congestion, ear pain, hearing loss, sinus pressure, sinus pain, sore throat and trouble swallowing.   Eyes: Negative for pain and visual disturbance.  Respiratory: Negative for cough, chest tightness, shortness of breath and wheezing.   Cardiovascular: Negative for chest pain, palpitations and leg swelling.  Gastrointestinal: Negative for abdominal distention, abdominal pain, blood in stool, constipation, diarrhea, nausea and vomiting.  Genitourinary: Negative for decreased urine volume, difficulty urinating, dysuria, penile pain and testicular pain.  Musculoskeletal: Negative for arthralgias, back pain and joint swelling.  Skin: Negative for rash.  Neurological: Negative for dizziness, weakness, numbness and headaches.  Hematological: Negative for adenopathy. Does not bruise/bleed easily.  Psychiatric/Behavioral: Negative for agitation, sleep disturbance and suicidal ideas. The patient is not nervous/anxious.     CBC:  Lab Results  Component Value Date   WBC 6.8 02/23/2020   HGB 15.8 02/23/2020   HCT 45.3 02/23/2020   MCH 32.0 11/01/2012   MCHC 34.9 02/23/2020   RDW 13.9 02/23/2020   PLT 220.0 02/23/2020   CMP: Lab Results  Component Value Date   NA 137 02/23/2020   K 3.8 02/23/2020   CL 103 02/23/2020   CO2 24 02/23/2020   GLUCOSE 185 (H) 02/23/2020   BUN 16 02/23/2020   CREATININE 0.75 02/23/2020   CREATININE 0.72 05/26/2017   GFRAA >89 05/26/2017   CALCIUM 9.7 02/23/2020   PROT 6.6 02/23/2020   BILITOT 0.6 02/23/2020   ALKPHOS 82 02/23/2020   ALT 42 02/23/2020   AST 22 02/23/2020   LIPID: Lab Results  Component Value Date   CHOL 162 02/23/2020   TRIG 383.0 (H) 02/23/2020   HDL 24.70 (L) 02/23/2020    LDLCALC 74 07/04/2018    Objective:  BP 122/72 (BP Location: Left Arm, Patient Position: Sitting, Cuff Size: Large)   Pulse 87   Temp 98 F (36.7 C) (Oral)   Ht 6' 1.25" (1.861 m)   Wt 237 lb 14.4 oz (107.9 kg)   SpO2 97%   BMI 31.17 kg/m   Weight: 237 lb 14.4 oz (107.9 kg)   BP Readings from Last 3 Encounters:  07/15/20 122/72  04/02/20 110/70  12/20/19 130/78   Wt Readings from Last 3 Encounters:  07/15/20 237 lb 14.4 oz (107.9 kg)  04/02/20 232 lb (105.2 kg)  12/20/19 236 lb 11.2 oz (107.4 kg)    Physical Exam Constitutional:      General: He is not in acute distress.    Appearance:  He is well-developed.  HENT:     Head: Normocephalic and atraumatic.     Right Ear: External ear normal.     Left Ear: External ear normal.     Nose: Nose normal.     Mouth/Throat:     Pharynx: No oropharyngeal exudate.  Eyes:     Conjunctiva/sclera: Conjunctivae normal.     Pupils: Pupils are equal, round, and reactive to light.  Neck:     Thyroid: No thyromegaly.  Cardiovascular:     Rate and Rhythm: Normal rate and regular rhythm.     Heart sounds: Normal heart sounds. No murmur heard.  No friction rub. No gallop.   Pulmonary:     Effort: Pulmonary effort is normal. No respiratory distress.     Breath sounds: Normal breath sounds. No stridor. No wheezing or rales.  Abdominal:     General: Bowel sounds are normal.     Palpations: Abdomen is soft.  Musculoskeletal:        General: Normal range of motion.     Cervical back: Neck supple.  Skin:    General: Skin is warm and dry.  Neurological:     Mental Status: He is alert and oriented to person, place, and time.  Psychiatric:        Behavior: Behavior normal.        Thought Content: Thought content normal.        Judgment: Judgment normal.     Assessment/Plan: There are no preventive care reminders to display for this patient. Health Maintenance reviewed - up to date.  1. Preventative health care Keep up with  healthy lifestyle, regular exercise and healthy eating.   2. Essential hypertension Well controlled. Continue current losartan 100mg , amlodipine 5mg . - CBC with Differential/Platelet; Future - Comprehensive metabolic panel; Future  3. Gastroesophageal reflux disease with esophagitis, unspecified whether hemorrhage Continue with Protonix 40 mg every other day.  He does use Tums as needed for spicy foods.  5. Type 2 diabetes mellitus with hyperglycemia, without long-term current use of insulin ([place]) Well-controlled.  Follows with endocrinology.  Continue glipizide 5 mg, Metformin 750 mg XR twice daily, Farxiga 10 mg daily, januvia 100mg  daily - Hemoglobin A1c; Future  6. Hyperlipidemia, unspecified hyperlipidemia type - Lipid panel; Future - TSH; Future  7. Hot flashes Coming intermittent, short acting, no associated sx. He is going to track episodes and we will have him bring log of these to endo appointment next month. - T4, free; Future   Return in about 6 months (around 01/12/2021) for Chronic condition visit.  Micheline Rough, MD

## 2020-07-16 ENCOUNTER — Other Ambulatory Visit: Payer: Self-pay | Admitting: Family Medicine

## 2020-07-16 DIAGNOSIS — E1165 Type 2 diabetes mellitus with hyperglycemia: Secondary | ICD-10-CM

## 2020-07-16 DIAGNOSIS — E781 Pure hyperglyceridemia: Secondary | ICD-10-CM

## 2020-07-16 DIAGNOSIS — E785 Hyperlipidemia, unspecified: Secondary | ICD-10-CM

## 2020-07-16 DIAGNOSIS — E1169 Type 2 diabetes mellitus with other specified complication: Secondary | ICD-10-CM

## 2020-07-16 LAB — T4, FREE: Free T4: 1.2 ng/dL (ref 0.8–1.8)

## 2020-07-16 LAB — CBC WITH DIFFERENTIAL/PLATELET
Absolute Monocytes: 677 cells/uL (ref 200–950)
Basophils Absolute: 79 cells/uL (ref 0–200)
Basophils Relative: 1.1 %
Eosinophils Absolute: 382 cells/uL (ref 15–500)
Eosinophils Relative: 5.3 %
HCT: 47.3 % (ref 38.5–50.0)
Hemoglobin: 16 g/dL (ref 13.2–17.1)
Lymphs Abs: 2218 cells/uL (ref 850–3900)
MCH: 31 pg (ref 27.0–33.0)
MCHC: 33.8 g/dL (ref 32.0–36.0)
MCV: 91.7 fL (ref 80.0–100.0)
MPV: 10.3 fL (ref 7.5–12.5)
Monocytes Relative: 9.4 %
Neutro Abs: 3845 cells/uL (ref 1500–7800)
Neutrophils Relative %: 53.4 %
Platelets: 254 10*3/uL (ref 140–400)
RBC: 5.16 10*6/uL (ref 4.20–5.80)
RDW: 12.9 % (ref 11.0–15.0)
Total Lymphocyte: 30.8 %
WBC: 7.2 10*3/uL (ref 3.8–10.8)

## 2020-07-16 LAB — COMPREHENSIVE METABOLIC PANEL
AG Ratio: 2.1 (calc) (ref 1.0–2.5)
ALT: 35 U/L (ref 9–46)
AST: 18 U/L (ref 10–35)
Albumin: 4.4 g/dL (ref 3.6–5.1)
Alkaline phosphatase (APISO): 72 U/L (ref 35–144)
BUN: 17 mg/dL (ref 7–25)
CO2: 24 mmol/L (ref 20–32)
Calcium: 10.1 mg/dL (ref 8.6–10.3)
Chloride: 103 mmol/L (ref 98–110)
Creat: 0.72 mg/dL (ref 0.70–1.25)
Globulin: 2.1 g/dL (calc) (ref 1.9–3.7)
Glucose, Bld: 181 mg/dL — ABNORMAL HIGH (ref 65–99)
Potassium: 4.1 mmol/L (ref 3.5–5.3)
Sodium: 138 mmol/L (ref 135–146)
Total Bilirubin: 0.5 mg/dL (ref 0.2–1.2)
Total Protein: 6.5 g/dL (ref 6.1–8.1)

## 2020-07-16 LAB — LIPID PANEL
Cholesterol: 162 mg/dL (ref <200)
HDL: 25 mg/dL — ABNORMAL LOW (ref >=40)
Non-HDL Cholesterol (Calc): 137 mg/dL (calc) — ABNORMAL HIGH (ref <130)
Total CHOL/HDL Ratio: 6.5 (calc) — ABNORMAL HIGH (ref <5.0)
Triglycerides: 468 mg/dL — ABNORMAL HIGH (ref <150)

## 2020-07-16 LAB — TSH: TSH: 3.29 mIU/L (ref 0.40–4.50)

## 2020-07-16 LAB — HEMOGLOBIN A1C
Hgb A1c MFr Bld: 6.5 % of total Hgb — ABNORMAL HIGH (ref <5.7)
Mean Plasma Glucose: 140 (calc)
eAG (mmol/L): 7.7 (calc)

## 2020-07-16 MED ORDER — ICOSAPENT ETHYL 1 G PO CAPS: 2.0000 g | ORAL_CAPSULE | Freq: Two times a day (BID) | ORAL | 1 refills | Status: DC

## 2020-07-27 ENCOUNTER — Other Ambulatory Visit: Payer: Self-pay | Admitting: Family Medicine

## 2020-07-27 MED ORDER — CANAGLIFLOZIN 100 MG PO TABS
100.0000 mg | ORAL_TABLET | Freq: Every day | ORAL | 3 refills | Status: DC
Start: 2020-07-27 — End: 2020-08-05

## 2020-08-02 ENCOUNTER — Encounter: Payer: Self-pay | Admitting: Family Medicine

## 2020-08-04 ENCOUNTER — Other Ambulatory Visit: Payer: Self-pay | Admitting: Family Medicine

## 2020-08-05 ENCOUNTER — Telehealth: Payer: Self-pay

## 2020-08-05 MED ORDER — CANAGLIFLOZIN 100 MG PO TABS
100.0000 mg | ORAL_TABLET | Freq: Every day | ORAL | 3 refills | Status: DC
Start: 2020-08-05 — End: 2021-01-20

## 2020-08-05 NOTE — Telephone Encounter (Signed)
Yes, that is OK. Thank you!

## 2020-08-05 NOTE — Telephone Encounter (Signed)
I received notification that Invokana needed a PA, I completed it and it was approved. I sent the medication to the pharmacy, but seen Januvia on the med list as well.  Is this still okay to do?   Thanks!

## 2020-08-07 ENCOUNTER — Ambulatory Visit (INDEPENDENT_AMBULATORY_CARE_PROVIDER_SITE_OTHER): Payer: BLUE CROSS/BLUE SHIELD | Admitting: Internal Medicine

## 2020-08-07 ENCOUNTER — Encounter: Payer: Self-pay | Admitting: Internal Medicine

## 2020-08-07 ENCOUNTER — Other Ambulatory Visit: Payer: Self-pay

## 2020-08-07 VITALS — BP 134/78 | HR 87 | Ht 73.0 in | Wt 235.8 lb

## 2020-08-07 DIAGNOSIS — E669 Obesity, unspecified: Secondary | ICD-10-CM

## 2020-08-07 DIAGNOSIS — E785 Hyperlipidemia, unspecified: Secondary | ICD-10-CM

## 2020-08-07 DIAGNOSIS — E1165 Type 2 diabetes mellitus with hyperglycemia: Secondary | ICD-10-CM | POA: Diagnosis not present

## 2020-08-07 LAB — POCT GLYCOSYLATED HEMOGLOBIN (HGB A1C): Hemoglobin A1C: 6.4 % — AB (ref 4.0–5.6)

## 2020-08-07 NOTE — Progress Notes (Signed)
Patient ID: Derrick Brown, male   DOB: 1950/11/07, 69 y.o.   MRN: [id number]   This visit occurred during the SARS-CoV-2 public health emergency.  Safety protocols were in place, including screening questions prior to the visit, additional usage of staff PPE, and extensive cleaning of exam room while observing appropriate contact time as indicated for disinfecting solutions.   HPI: DAREN YEAGLE is a 69 y.o.-year-old male, returning for follow-up for DM2, dx in ~2007, non-insulin-dependent, now more controlled, without long term complications. Last visit 4 months ago.  Reviewed his HbA1c levels: Lab Results  Component Value Date   HGBA1C 6.5 (H) 07/15/2020   HGBA1C 6.6 (H) 02/23/2020   HGBA1C 6.4 (A) 11/28/2019   Pt is on a regimen of: - Metformin ER  750 mg twice a day with meals >> 1500 mg with dinner >> nausea >> 750 mg twice a day - Januvia 100 mg before breakfast - Invokana 100 mg >> Jardiance 25 before breakfast  - Glipizide 5 mg as needed for larger meal - once a week >> moved this before a larger meal at last visit In 02/2019 we tried Rybelsus but we had to stop due to GI side effects and sugars were also more fluctuating.  We restarted Januvia afterwards.  Pt checks his sugars twice a day: - am: 111, 121-163 >> 93-147, 157>> 93-150, 153 >> 108, 121-172, 192 - 2h after b'fast: n/c >> 198, 200 >> n/c - before lunch: n/c - 2h after lunch: n/c - before dinner: n/c - 2h after dinner: n/c - bedtime: 119-205 >> 111-185 >> 103-183, 228 >> 87, 100-175, 203 - nighttime: n/c Lowest sugar was 93 >> 93 >> 87. Highest sugar was 185 >> 228>> 203 .  Glucometer: Freestyle Lite  Pt's meals are: - Breakfast: oatmeal + blueberries + pecan; cereals; fruit juice; coffee - Lunch: chicken salad or sandwich - late - Dinner: cereal or light dinner - late >> eating out more - Snacks: takes snack boxes to work: yoghurt, veggies, grapes/prunes, chips/cheetos No formal exercise.  No CKD, last  BUN/creatinine:  Lab Results  Component Value Date   BUN 17 07/15/2020   BUN 16 02/23/2020   CREATININE 0.72 07/15/2020   CREATININE 0.75 02/23/2020  On losartan.  No MAU: Lab Results  Component Value Date   MICRALBCREAT 1.3 02/23/2020   MICRALBCREAT 1.3 11/30/2018   MICRALBCREAT 1.4 07/04/2018   MICRALBCREAT 1.2 06/30/2017   MICRALBCREAT 1.5 07/28/2016   MICRALBCREAT 1.0 10/18/2014   MICRALBCREAT 1.1 04/23/2014   MICRALBCREAT 0.8 09/08/2013   MICRALBCREAT 0.9 03/07/2010   MICRALBCREAT 11.5 02/14/2009   + HL; last set of lipids: Lab Results  Component Value Date   CHOL 162 07/15/2020   CHOL 162 02/23/2020   CHOL 161 07/05/2019   Lab Results  Component Value Date   HDL 25 (L) 07/15/2020   HDL 24.70 (L) 02/23/2020   HDL 27.40 (L) 07/05/2019   Lab Results  Component Value Date   [facility]  07/15/2020     Comment:     . LDL cholesterol not calculated. Triglyceride levels greater than 400 mg/dL invalidate calculated LDL results. . Reference range: <100 . Desirable range <100 mg/dL for primary prevention;   <70 mg/dL for patients with CHD or diabetic patients  with > or = 2 CHD risk factors. Marland Kitchen LDL-C is now calculated using the Martin-Hopkins  calculation, which is a validated novel method providing  better accuracy than the Friedewald equation in the  estimation of  LDL-C.  Cresenciano Genre et al. Annamaria Helling. 6160;737(10): 2061-2068  (http://education.[employer].com/faq/FAQ164)    LDLCALC 74 07/04/2018   LDLCALC 75 09/08/2013   Lab Results  Component Value Date   TRIG 468 (H) 07/15/2020   TRIG 383.0 (H) 02/23/2020   TRIG 389.0 (H) 07/05/2019   Lab Results  Component Value Date   CHOLHDL 6.5 (H) 07/15/2020   CHOLHDL 7 02/23/2020   CHOLHDL 6 07/05/2019   Lab Results  Component Value Date   LDLDIRECT 83.0 02/23/2020   LDLDIRECT 78.0 07/05/2019   LDLDIRECT 84.0 11/30/2018  On Lipitor 20 every other day and fish oil 2000 mg twice daily. Planning to start  [place]. - last eye exam was: 03/2020: No DR, + incipient cataracts. Dr. Gershon Crane. - no numbness and tingling in his feet.    She also has HTN, GERD.  ROS: Constitutional: no weight gain/no weight loss, no fatigue, no subjective hyperthermia, no subjective hypothermia Eyes: no blurry vision, no xerophthalmia ENT: no sore throat, no nodules palpated in neck, no dysphagia, no odynophagia, no hoarseness Cardiovascular: no CP/no SOB/no palpitations/no leg swelling Respiratory: no cough/no SOB/no wheezing Gastrointestinal: no N/no V/no D/no C/no acid reflux Musculoskeletal: no muscle aches/no joint aches Skin: no rashes, no hair loss Neurological: no tremors/no numbness/no tingling/no dizziness  I reviewed pt's medications, allergies, PMH, social hx, family hx, and changes were documented in the history of present illness. Otherwise, unchanged from my initial visit note.  Past Medical History:  Diagnosis Date  . ACNE ROSACEA 02/21/2009  . Allergy   . COUGH DUE TO ACE INHIBITORS 04/12/2009  . Diabetes mellitus without complication ([place])    type 2  . DYSPHAGIA 05/15/2009  . ESOPHAGITIS, REFLUX 05/13/2009  . FATIGUE 03/14/2010  . Fatty liver   . GERD (gastroesophageal reflux disease)   . HYPERGLYCEMIA 02/21/2009  . HYPERLIPIDEMIA 02/21/2009  . HYPERTENSION NEC 02/21/2009  . INSOMNIA 04/02/2010  . MUSCLE PAIN 05/13/2009  . OBSTRUCTIVE SLEEP APNEA 04/02/2010  . RENAL CALCULUS 02/21/2009  . Sleep apnea   . TEMPOROMANDIBULAR JOINT DISORDER 06/10/2009  . Unspecified hypertrophic and atrophic condition of skin    Past Surgical History:  Procedure Laterality Date  . COLONOSCOPY    . ESOPHAGOGASTRODUODENOSCOPY    . KIDNEY STONE SURGERY     removal  . TONSILLECTOMY AND ADENOIDECTOMY     Social History   Social History  . Marital status: Married    Spouse name: N/A   Social History Main Topics  . Smoking status: Never Smoker  . Smokeless tobacco: Never Used  . Alcohol use 1.7 oz/week    1  burbon  . Drug use: No   Current Outpatient Medications on File Prior to Visit  Medication Sig Dispense Refill  . amLODipine (NORVASC) 5 MG tablet TAKE 1 TABLET BY MOUTH EVERY DAY 90 tablet 1  . aspirin 81 MG tablet Take 81 mg by mouth daily.      Marland Kitchen atorvastatin (LIPITOR) 20 MG tablet TAKE 1 TABLET EVERY OTHER DAY 90 tablet 3  . betamethasone dipropionate 0.05 % cream Apply topically 2 (two) times daily. 30 g 0  . canagliflozin (INVOKANA) 100 MG TABS tablet Take 1 tablet (100 mg total) by mouth daily before breakfast. 90 tablet 3  . glipiZIDE (GLUCOTROL) 5 MG tablet Take 1 tablet (5 mg total) by mouth daily before supper. 90 tablet 3  . icosapent Ethyl (VASCEPA) 1 g capsule Take 2 capsules (2 g total) by mouth 2 (two) times daily. 180 capsule 1  . losartan (  COZAAR) 100 MG tablet TAKE 1 TABLET BY MOUTH EVERY DAY 90 tablet 1  . metFORMIN (GLUCOPHAGE-XR) 750 MG 24 hr tablet TAKE 1 TABLET BY MOUTH TWICE A DAY 180 tablet 3  . Multiple Vitamin (MULTIVITAMIN) tablet Take 1 tablet by mouth daily.      . Omega-3 Fatty Acids (FISH OIL) 1000 MG CAPS Take 1,000 mg by mouth 2 (two) times daily.      Marland Kitchen OVER THE COUNTER MEDICATION Vitamin C 500 mg, one tablet daily.    Marland Kitchen OVER THE COUNTER MEDICATION Cinnamon 1000 mg one tablet daily.    Marland Kitchen OVER THE COUNTER MEDICATION CQ10 one tablet daily.    . pantoprazole (PROTONIX) 40 MG tablet TAKE 1 TABLET BY MOUTH EVERY DAY 90 tablet 1  . Saw Palmetto 450 MG CAPS Take by mouth daily.    . sitaGLIPtin (JANUVIA) 100 MG tablet TAKE 1 TABLET BY MOUTH EVERY DAY 90 tablet 3  . traZODone (DESYREL) 50 MG tablet TAKE 1/2-1 TABLET BY MOUTH AT BEDTIME AS NEEDED FOR SLEEP. 90 tablet 1  . triamcinolone cream (KENALOG) 0.1 % Apply topically 2 (two) times daily as needed. 30 g 0   No current facility-administered medications on file prior to visit.   Allergies  Allergen Reactions  . Hctz [Hydrochlorothiazide] Hives, Itching and Rash   Family History  Problem Relation Age of  Onset  . Stroke Mother 41  . Rectal cancer Sister   . Heart attack Father 61  . Healthy Brother   . Colon polyps Sister   . Heart disease Other        family histrory  . Diabetes Paternal Grandmother   . Colon cancer Neg Hx   . Esophageal cancer Neg Hx   . Liver cancer Neg Hx   . Pancreatic cancer Neg Hx   . Stomach cancer Neg Hx    PE: BP 134/78   Pulse 87   Ht 6\' 1"  (1.854 m)   Wt 235 lb 12.8 oz (107 kg)   SpO2 97%   BMI 31.11 kg/m  Wt Readings from Last 3 Encounters:  08/07/20 235 lb 12.8 oz (107 kg)  07/15/20 237 lb 14.4 oz (107.9 kg)  04/02/20 232 lb (105.2 kg)   Constitutional: overweight, in NAD Eyes: PERRLA, EOMI, no exophthalmos ENT: moist mucous membranes, no thyromegaly, no cervical lymphadenopathy Cardiovascular: RRR, No MRG Respiratory: CTA B Gastrointestinal: abdomen soft, NT, ND, BS+ Musculoskeletal: no deformities, strength intact in all 4 Skin: moist, warm, no rashes Neurological: no tremor with outstretched hands, DTR normal in all 4  ASSESSMENT: 1. DM2, non-insulin-dependent, uncontrolled, without long term complications, but with hyperglycemia  2. Obesity class 1  3. HL  PLAN:  1. Patient with longstanding, previously uncontrolled diabetes, with much improved control after starting an SGLT2 inhibitor.  He obtains Invokana unfortunately with a coupon, without running through insurance.  He is also on Metformin ER and sulfonylurea as needed before a large meal.  We tried to Rybelsus but he could not tolerate it due to nausea.  He is now back on Januvia.  We tried to move the entire metformin dose at night to help with morning sugars but he could not tolerate it due to nausea.  At last visit, HbA1c was slightly higher than before at 6.6%.  At that time, he was eating dinner is out more and sugars were expired at bedtime and subsequently in the morning.  He was taking glipizide at bedtime as needed for high blood sugars and  I advised him to most days  before a larger meal.  We also discussed about the importance of limiting eating out. Since then, he had another HbA1c less than a month ago and is at 6.5%, decreased. - HbA1c today: 6.4% (improved) -At today's visit, sugars are at goal at bedtime are high in the morning.  Most of his blood sugars are in the 130s and 140s.  Occasionally, they are in the 160s to 170s.  He mentions that he comes home around 7 PM and eats a late dinner.  This is probably the reason why his sugars are still high in the morning.  He is not very active, but occasionally walks in the neighborhood in the evening.  We discussed that the increased exercise can help with the morning sugars.  Also, discussed about other options due to his a.m. sugars:  Moving the entire Metformin dose at night (retry)  Trying again to switch from a DPP 4 inhibitor to a GLP-1 receptor agonist  Starting insulin -For now, he agrees to retry to take the entire Metformin dose with dinner.  If this is not tolerated, she will take 1 tablet with dinner and 1 at bedtime. - I suggested to:  Patient Instructions  Please try to move: - Metformin ER 1500 mg at night  Continue: - Invokana 100 mg before b'fast - Januvia 100 mg before b'fast - Glipizide 5 mg as needed before a larger meal  Please return in 4 months with your sugar log.   - advised to check sugars at different times of the day - 1x a day, rotating check times - advised for yearly eye exams >> he is UTD - return to clinic in 4 months   2. Obesity class 1 -Continue SGLT2 inhibitor which should also help with weight loss -We tried Rybelsus which should have helped with weight loss, however, he could not tolerate this and he is now back on Januvia, which is weight neutral -Before last visit, he lost 5 pounds; he gained 3 pounds since then  3. HL - Reviewed latest lipid panel from 06/2020: LDL at goal, lower than 100, triglycerides high, HDL low: Lab Results  Component Value Date    CHOL 162 07/15/2020   HDL 25 (L) 07/15/2020   [place]  07/15/2020     Comment:     . LDL cholesterol not calculated. Triglyceride levels greater than 400 mg/dL invalidate calculated LDL results. . Reference range: <100 . Desirable range <100 mg/dL for primary prevention;   <70 mg/dL for patients with CHD or diabetic patients  with > or = 2 CHD risk factors. Marland Kitchen LDL-C is now calculated using the Martin-Hopkins  calculation, which is a validated novel method providing  better accuracy than the Friedewald equation in the  estimation of LDL-C.  Cresenciano Genre et al. Annamaria Helling. 4010;272(53): 2061-2068  (http://education.[facility].com/faq/FAQ164)    LDLDIRECT 83.0 02/23/2020   TRIG 468 (H) 07/15/2020   CHOLHDL 6.5 (H) 07/15/2020  - Continues Lipitor 20 every other day without side effects -We increase fish oil to 2000 mg twice a day to improve his triglycerides.  However, this did not help so we discussed at last visit about reducing fatty foods and eating out less. -PCP is planning to start him on Vascepa (PA in process).  If this is not approved, she would probably need fenofibrate.  Philemon Kingdom, MD PhD [place] [facility] Endocrinology

## 2020-08-07 NOTE — Patient Instructions (Addendum)
Please try to move: - Metformin ER 1500 mg at night  Continue: - Invokana 100 mg before b'fast - Januvia 100 mg before b'fast - Glipizide 5 mg as needed before a larger meal  Please return in 4 months with your sugar log.

## 2020-08-07 NOTE — Addendum Note (Signed)
Addended by: Caprice Beaver T on: 08/07/2020 09:25 AM   Modules accepted: Orders

## 2020-09-08 ENCOUNTER — Other Ambulatory Visit: Payer: Self-pay | Admitting: Family Medicine

## 2020-09-08 DIAGNOSIS — E7801 Familial hypercholesterolemia: Secondary | ICD-10-CM

## 2020-09-21 ENCOUNTER — Other Ambulatory Visit: Payer: Self-pay | Admitting: Family Medicine

## 2020-09-21 DIAGNOSIS — I10 Essential (primary) hypertension: Secondary | ICD-10-CM

## 2020-10-02 ENCOUNTER — Other Ambulatory Visit: Payer: Self-pay | Admitting: Family Medicine

## 2020-10-02 DIAGNOSIS — E1165 Type 2 diabetes mellitus with hyperglycemia: Secondary | ICD-10-CM

## 2020-10-02 DIAGNOSIS — E781 Pure hyperglyceridemia: Secondary | ICD-10-CM

## 2020-10-02 DIAGNOSIS — E1169 Type 2 diabetes mellitus with other specified complication: Secondary | ICD-10-CM

## 2020-10-07 ENCOUNTER — Encounter: Payer: Self-pay | Admitting: Family Medicine

## 2020-10-07 NOTE — Telephone Encounter (Signed)
Did we try to get prior auth? I selected this due to his history and current treatments. This would be my preferred medication. I don't see anything in scanned re prior auth.

## 2020-10-08 ENCOUNTER — Other Ambulatory Visit: Payer: Self-pay | Admitting: Family Medicine

## 2020-10-08 DIAGNOSIS — E1169 Type 2 diabetes mellitus with other specified complication: Secondary | ICD-10-CM

## 2020-10-08 DIAGNOSIS — E785 Hyperlipidemia, unspecified: Secondary | ICD-10-CM

## 2020-10-08 DIAGNOSIS — E781 Pure hyperglyceridemia: Secondary | ICD-10-CM

## 2020-10-08 DIAGNOSIS — E1165 Type 2 diabetes mellitus with hyperglycemia: Secondary | ICD-10-CM

## 2020-10-08 NOTE — Telephone Encounter (Signed)
See prior note from 08/07/20 in which prior auth was sent.  I have not seen a fax regarding coverage or denial.  Message sent to PCP and patient via Mychart message.

## 2020-10-08 NOTE — Telephone Encounter (Signed)
Prior auth sent in October and patient was informed via Mychart message.

## 2020-10-14 ENCOUNTER — Other Ambulatory Visit: Payer: Self-pay | Admitting: Family Medicine

## 2020-10-14 MED ORDER — FENOFIBRATE 48 MG PO TABS: 48.0000 mg | ORAL_TABLET | Freq: Every day | ORAL | 1 refills | Status: DC

## 2020-10-14 NOTE — Telephone Encounter (Signed)
[place] like our other option lovaza is not on formulary either. I sent in fenofibrate which is an alternative, but not the same medication. I esnt it locally, but if he wants this to mail order; please change it. Let's have him try this for 2 months along with statin and then recheck lipid panel in 2 months time. We can adjust from there.

## 2020-10-14 NOTE — Telephone Encounter (Signed)
Attempted prior auth via [email or web address] and note was received stating [employer] does not handle prior auths.  Message sent to PCP.

## 2020-10-21 ENCOUNTER — Encounter: Payer: Self-pay | Admitting: Family Medicine

## 2020-11-04 ENCOUNTER — Other Ambulatory Visit: Payer: Self-pay | Admitting: Internal Medicine

## 2020-11-29 ENCOUNTER — Other Ambulatory Visit: Payer: Self-pay | Admitting: Family Medicine

## 2020-12-10 ENCOUNTER — Ambulatory Visit: Payer: Self-pay | Admitting: Internal Medicine

## 2020-12-10 ENCOUNTER — Other Ambulatory Visit: Payer: Self-pay

## 2020-12-10 ENCOUNTER — Encounter: Payer: Self-pay | Admitting: Internal Medicine

## 2020-12-10 VITALS — BP 120/82 | HR 92 | Ht 73.0 in | Wt 236.8 lb

## 2020-12-10 DIAGNOSIS — E669 Obesity, unspecified: Secondary | ICD-10-CM | POA: Diagnosis not present

## 2020-12-10 DIAGNOSIS — E785 Hyperlipidemia, unspecified: Secondary | ICD-10-CM

## 2020-12-10 DIAGNOSIS — E1165 Type 2 diabetes mellitus with hyperglycemia: Secondary | ICD-10-CM

## 2020-12-10 LAB — POCT GLYCOSYLATED HEMOGLOBIN (HGB A1C): Hemoglobin A1C: 6.7 % — AB (ref 4.0–5.6)

## 2020-12-10 MED ORDER — RYBELSUS 7 MG PO TABS: 7.0000 mg | ORAL_TABLET | Freq: Every day | ORAL | 3 refills | Status: DC

## 2020-12-10 NOTE — Patient Instructions (Signed)
Please continue: - Metformin ER 1500 mg at night - Invokana 100 mg before b'fast - Glipizide 5 mg as needed before a larger meal  Please change from Januvia to Rybelsus 3.5 mg daily before b'fast x 1-2 weeks, then increase to 7 mg daily.  Please return in 3-4 months with your sugar log.

## 2020-12-10 NOTE — Progress Notes (Signed)
Patient ID: Derrick Brown, male   DOB: 1951/02/25, 70 y.o.   MRN: [id number]   This visit occurred during the SARS-CoV-2 public health emergency.  Safety protocols were in place, including screening questions prior to the visit, additional usage of staff PPE, and extensive cleaning of exam room while observing appropriate contact time as indicated for disinfecting solutions.   HPI: Derrick Brown is a 70 y.o.-year-old male, returning for follow-up for DM2, dx in ~2007, non-insulin-dependent, now more controlled, without long term complications. Last visit 4 months ago. He has a new insurance.  He is not on Medicare yet.  Reviewed HbA1c levels: Lab Results  Component Value Date   HGBA1C 6.4 (A) 08/07/2020   HGBA1C 6.5 (H) 07/15/2020   HGBA1C 6.6 (H) 02/23/2020   Pt is on a regimen of: - Metformin ER  750 mg twice a day with meals >> 1500 mg with dinner >> nausea >> 750 mg twice a day >> 1500 mg at dinnertime - Januvia 100 mg before breakfast - Invokana 100 mg  before breakfast  - Glipizide 5 mg as needed before a larger meal In 02/2019 we tried Rybelsus but we had to stop due to GI side effects and sugars were also more fluctuating.  We restarted Januvia afterwards.  Pt checks his sugars twice a day: - am: 93-147, 157>> 93-150, 153 >> 108, 121-172, 192 >> 97-165, 173  - 2h after b'fast: n/c >> 198, 200 >> n/c - before lunch: n/c - 2h after lunch: n/c - before dinner: n/c - 2h after dinner: n/c - bedtime: 111-185 >> 103-183, 228 >> 87, 100-175, 203 >> 86-170 - nighttime: n/c Lowest sugar was 93 >> 93 >> 87. Highest sugar was 185 >> 228>> 203 .  Glucometer: Freestyle Lite  Pt's meals are: - Breakfast: oatmeal + blueberries + pecan; cereals; fruit juice; coffee - Lunch: chicken salad or sandwich - late - Dinner: cereal or light dinner - late >> eating out more - Snacks: takes snack boxes to work: yoghurt, veggies, grapes/prunes, chips/cheetos No formal exercise.  No CKD, last  BUN/creatinine:  Lab Results  Component Value Date   BUN 17 07/15/2020   BUN 16 02/23/2020   CREATININE 0.72 07/15/2020   CREATININE 0.75 02/23/2020  On losartan.  No MAU: Lab Results  Component Value Date   MICRALBCREAT 1.3 02/23/2020   MICRALBCREAT 1.3 11/30/2018   MICRALBCREAT 1.4 07/04/2018   MICRALBCREAT 1.2 06/30/2017   MICRALBCREAT 1.5 07/28/2016   MICRALBCREAT 1.0 10/18/2014   MICRALBCREAT 1.1 04/23/2014   MICRALBCREAT 0.8 09/08/2013   MICRALBCREAT 0.9 03/07/2010   MICRALBCREAT 11.5 02/14/2009   + HL; last set of lipids: Lab Results  Component Value Date   CHOL 162 07/15/2020   CHOL 162 02/23/2020   CHOL 161 07/05/2019   Lab Results  Component Value Date   HDL 25 (L) 07/15/2020   HDL 24.70 (L) 02/23/2020   HDL 27.40 (L) 07/05/2019   Lab Results  Component Value Date   [facility]  07/15/2020     Comment:     . LDL cholesterol not calculated. Triglyceride levels greater than 400 mg/dL invalidate calculated LDL results. . Reference range: <100 . Desirable range <100 mg/dL for primary prevention;   <70 mg/dL for patients with CHD or diabetic patients  with > or = 2 CHD risk factors. Marland Kitchen LDL-C is now calculated using the Martin-Hopkins  calculation, which is a validated novel method providing  better accuracy than the Friedewald equation in the  estimation of LDL-C.  Cresenciano Genre et al. Annamaria Helling. 0947;096(28): 2061-2068  (http://education.[facility].com/faq/FAQ164)    LDLCALC 74 07/04/2018   LDLCALC 75 09/08/2013   Lab Results  Component Value Date   TRIG 468 (H) 07/15/2020   TRIG 383.0 (H) 02/23/2020   TRIG 389.0 (H) 07/05/2019   Lab Results  Component Value Date   CHOLHDL 6.5 (H) 07/15/2020   CHOLHDL 7 02/23/2020   CHOLHDL 6 07/05/2019   Lab Results  Component Value Date   LDLDIRECT 83.0 02/23/2020   LDLDIRECT 78.0 07/05/2019   LDLDIRECT 84.0 11/30/2018  On Lipitor 20 every other day and fish oil 2000 g twice a day.  Vascepa 2 g twice a day  was not covered >> now Fenofibrate 48 mg daily - last eye exam was: 03/2020: No DR, + incipient cataracts. Dr. Gershon Crane. - no numbness and tingling in his feet.    She also has HTN, GERD.  ROS: Constitutional: no weight gain/no weight loss, no fatigue, no subjective hyperthermia, no subjective hypothermia Eyes: no blurry vision, no xerophthalmia ENT: no sore throat, no nodules palpated in neck, no dysphagia, no odynophagia, no hoarseness Cardiovascular: no CP/no SOB/no palpitations/no leg swelling Respiratory: no cough/no SOB/no wheezing Gastrointestinal: no N/no V/no D/no C/no acid reflux Musculoskeletal: no muscle aches/no joint aches Skin: no rashes, no hair loss Neurological: no tremors/no numbness/no tingling/no dizziness  I reviewed pt's medications, allergies, PMH, social hx, family hx, and changes were documented in the history of present illness. Otherwise, unchanged from my initial visit note.  Past Medical History:  Diagnosis Date  . ACNE ROSACEA 02/21/2009  . Allergy   . COUGH DUE TO ACE INHIBITORS 04/12/2009  . Diabetes mellitus without complication ([place])    type 2  . DYSPHAGIA 05/15/2009  . ESOPHAGITIS, REFLUX 05/13/2009  . FATIGUE 03/14/2010  . Fatty liver   . GERD (gastroesophageal reflux disease)   . HYPERGLYCEMIA 02/21/2009  . HYPERLIPIDEMIA 02/21/2009  . HYPERTENSION NEC 02/21/2009  . INSOMNIA 04/02/2010  . MUSCLE PAIN 05/13/2009  . OBSTRUCTIVE SLEEP APNEA 04/02/2010  . RENAL CALCULUS 02/21/2009  . Sleep apnea   . TEMPOROMANDIBULAR JOINT DISORDER 06/10/2009  . Unspecified hypertrophic and atrophic condition of skin    Past Surgical History:  Procedure Laterality Date  . COLONOSCOPY    . ESOPHAGOGASTRODUODENOSCOPY    . KIDNEY STONE SURGERY     removal  . TONSILLECTOMY AND ADENOIDECTOMY     Social History   Social History  . Marital status: Married    Spouse name: N/A   Social History Main Topics  . Smoking status: Never Smoker  . Smokeless tobacco: Never Used   . Alcohol use 1.7 oz/week    1 burbon  . Drug use: No   Current Outpatient Medications on File Prior to Visit  Medication Sig Dispense Refill  . amLODipine (NORVASC) 5 MG tablet TAKE 1 TABLET BY MOUTH EVERY DAY 90 tablet 0  . aspirin 81 MG tablet Take 81 mg by mouth daily.      Marland Kitchen atorvastatin (LIPITOR) 20 MG tablet TAKE 1 TABLET EVERY OTHER DAY 90 tablet 3  . betamethasone dipropionate 0.05 % cream Apply topically 2 (two) times daily. 30 g 0  . canagliflozin (INVOKANA) 100 MG TABS tablet Take 1 tablet (100 mg total) by mouth daily before breakfast. 90 tablet 3  . fenofibrate (TRICOR) 48 MG tablet Take 1 tablet (48 mg total) by mouth daily. 90 tablet 1  . glipiZIDE (GLUCOTROL) 5 MG tablet TAKE 1 TABLET BY MOUTH EVERY  DAY BEFORE SUPPER 90 tablet 3  . losartan (COZAAR) 100 MG tablet TAKE 1 TABLET BY MOUTH EVERY DAY 90 tablet 1  . metFORMIN (GLUCOPHAGE-XR) 750 MG 24 hr tablet TAKE 1 TABLET BY MOUTH TWICE A DAY 180 tablet 3  . Multiple Vitamin (MULTIVITAMIN) tablet Take 1 tablet by mouth daily.      . Omega-3 Fatty Acids (FISH OIL) 1000 MG CAPS Take 1,000 mg by mouth 2 (two) times daily.      Marland Kitchen OVER THE COUNTER MEDICATION Vitamin C 500 mg, one tablet daily.    Marland Kitchen OVER THE COUNTER MEDICATION Cinnamon 1000 mg one tablet daily.    Marland Kitchen OVER THE COUNTER MEDICATION CQ10 one tablet daily.    . pantoprazole (PROTONIX) 40 MG tablet TAKE 1 TABLET BY MOUTH EVERY DAY 90 tablet 1  . Saw Palmetto 450 MG CAPS Take by mouth daily.    . sitaGLIPtin (JANUVIA) 100 MG tablet TAKE 1 TABLET BY MOUTH EVERY DAY 90 tablet 3  . traZODone (DESYREL) 50 MG tablet TAKE 1/2-1 TABLET BY MOUTH AT BEDTIME AS NEEDED FOR SLEEP. 90 tablet 1  . triamcinolone cream (KENALOG) 0.1 % Apply topically 2 (two) times daily as needed. 30 g 0  . VASCEPA 1 g capsule TAKE 1 CAPSULE BY MOUTH FOUR TIMES A DAY 120 capsule 0   No current facility-administered medications on file prior to visit.   Allergies  Allergen Reactions  . Hctz  [Hydrochlorothiazide] Hives, Itching and Rash   Family History  Problem Relation Age of Onset  . Stroke Mother 1  . Rectal cancer Sister   . Heart attack Father 57  . Healthy Brother   . Colon polyps Sister   . Heart disease Other        family histrory  . Diabetes Paternal Grandmother   . Colon cancer Neg Hx   . Esophageal cancer Neg Hx   . Liver cancer Neg Hx   . Pancreatic cancer Neg Hx   . Stomach cancer Neg Hx    PE: BP 120/82   Pulse 92   Ht 6\' 1"  (1.854 m)   Wt 236 lb 12.8 oz (107.4 kg)   SpO2 98%   BMI 31.24 kg/m  Wt Readings from Last 3 Encounters:  12/10/20 236 lb 12.8 oz (107.4 kg)  08/07/20 235 lb 12.8 oz (107 kg)  07/15/20 237 lb 14.4 oz (107.9 kg)   Constitutional: overweight, in NAD Eyes: PERRLA, EOMI, no exophthalmos ENT: moist mucous membranes, no thyromegaly, no cervical lymphadenopathy Cardiovascular: RRR, No MRG Respiratory: CTA B Gastrointestinal: abdomen soft, NT, ND, BS+ Musculoskeletal: no deformities, strength intact in all 4 Skin: moist, warm, no rashes Neurological: no tremor with outstretched hands, DTR normal in all 4  ASSESSMENT: 1. DM2, non-insulin-dependent, uncontrolled, without long term complications, but with hyperglycemia  2. Obesity class 1  3. HL  PLAN:  1. Patient with longstanding, previously uncontrolled type 2 diabetes, with much improved control after starting with an SGLT2 inhibitor.  We tried Rybelsus but he could not tolerate it due to nausea.  He is now back on Januvia.  He had nausea when we moved the entire Metformin dose at night but at last visit, he wanted to retry this as sugars were higher than goal in the morning (lowest BG 130s and 140s, but occasionally in the 160s and 170s).  He was eating a late dinner and I advised him to try to move it earlier.  We also discussed about possibly starting insulin but  we did not do this at last visit.  At that time, HbA1c was still at goal, and improved, at 6.4%. -At today's  visit, sugars are approximately the same as before, occasionally in the 160s and 170s in the morning, despite moving the Metformin dose at night at last visit.  He is tolerating this higher dose well. -As he is running out of Januvia, we discussed about switching from [place] to Rybelsus.  He did try this in the past and had GI symptoms, but retrospectively, at the same time he also had COVID and he mentions that the symptoms may have been related to the viral infection.  We will start at a low dose and increase as tolerated.  We will continue the rest of the regimen. - I suggested to:  Patient Instructions  Please continue: - Metformin ER 1500 mg at night - Invokana 100 mg before b'fast - Glipizide 5 mg as needed before a larger meal  Please change from Januvia to Rybelsus 3.5 mg daily before b'fast x 1-2 weeks, then increase to 7 mg daily.  Please return in 3-4 months with your sugar log.   - we checked his HbA1c: 6.7% (higher) - advised to check sugars at different times of the day - 1x a day, rotating check times - advised for yearly eye exams >> he is UTD - return to clinic in 4 months   2. Obesity class 1 -Continue SGLT2 inhibitor which should also help with weight loss -We tried Rybelsus which should have helped with weight loss, however, he could not tolerate this and he is now back on Januvia, which is weight neutral -He gained 3 pounds before last visit  3. HL -Reviewed latest lipid panel from 06/2020: LDL at goal, lower than 100, triglycerides high, HDL low: Lab Results  Component Value Date   CHOL 162 07/15/2020   HDL 25 (L) 07/15/2020   [place]  07/15/2020     Comment:     . LDL cholesterol not calculated. Triglyceride levels greater than 400 mg/dL invalidate calculated LDL results. . Reference range: <100 . Desirable range <100 mg/dL for primary prevention;   <70 mg/dL for patients with CHD or diabetic patients  with > or = 2 CHD risk factors. Marland Kitchen LDL-C is now  calculated using the Martin-Hopkins  calculation, which is a validated novel method providing  better accuracy than the Friedewald equation in the  estimation of LDL-C.  Cresenciano Genre et al. Annamaria Helling. 1610;960(45): 2061-2068  (http://education.[facility].com/faq/FAQ164)    LDLDIRECT 83.0 02/23/2020   TRIG 468 (H) 07/15/2020   CHOLHDL 6.5 (H) 07/15/2020  -Continues Lipitor 20 every other day without side effects -He is also on fish oil 2000 mg twice a day.  PCP was planning to start him on Vascepa at last visit.  We discussed that if this is not improved, he may need fenofibrate >> he started this at 48 mg daily.   Philemon Kingdom, MD PhD [facility]

## 2020-12-10 NOTE — Addendum Note (Signed)
Addended by: Lauralyn Primes on: 12/10/2020 08:38 AM   Modules accepted: Orders

## 2020-12-11 ENCOUNTER — Other Ambulatory Visit: Payer: Self-pay | Admitting: Family Medicine

## 2021-01-17 ENCOUNTER — Encounter: Payer: Self-pay | Admitting: Internal Medicine

## 2021-01-17 DIAGNOSIS — E669 Obesity, unspecified: Secondary | ICD-10-CM

## 2021-01-20 MED ORDER — DAPAGLIFLOZIN PROPANEDIOL 10 MG PO TABS: 10.0000 mg | ORAL_TABLET | Freq: Every day | ORAL | 0 refills | Status: DC

## 2021-01-20 NOTE — Telephone Encounter (Signed)
Rx for Farxiga 10 mg was sent to preferred pharmacy.

## 2021-02-02 ENCOUNTER — Other Ambulatory Visit: Payer: Self-pay | Admitting: Family Medicine

## 2021-02-22 ENCOUNTER — Other Ambulatory Visit: Payer: Self-pay | Admitting: Family Medicine

## 2021-02-22 ENCOUNTER — Other Ambulatory Visit: Payer: Self-pay | Admitting: Internal Medicine

## 2021-02-22 DIAGNOSIS — E7801 Familial hypercholesterolemia: Secondary | ICD-10-CM

## 2021-02-26 ENCOUNTER — Other Ambulatory Visit: Payer: Self-pay | Admitting: Internal Medicine

## 2021-03-19 ENCOUNTER — Other Ambulatory Visit: Payer: Self-pay | Admitting: Family Medicine

## 2021-03-19 DIAGNOSIS — I10 Essential (primary) hypertension: Secondary | ICD-10-CM

## 2021-03-20 ENCOUNTER — Other Ambulatory Visit: Payer: Self-pay | Admitting: Family Medicine

## 2021-03-25 ENCOUNTER — Other Ambulatory Visit: Payer: Self-pay | Admitting: Family Medicine

## 2021-03-31 LAB — HM DIABETES EYE EXAM

## 2021-04-18 ENCOUNTER — Encounter: Payer: Self-pay | Admitting: Internal Medicine

## 2021-04-18 ENCOUNTER — Ambulatory Visit: Payer: No Typology Code available for payment source | Admitting: Internal Medicine

## 2021-04-18 ENCOUNTER — Other Ambulatory Visit: Payer: Self-pay

## 2021-04-18 VITALS — BP 126/80 | HR 101 | Ht 73.0 in | Wt 235.0 lb

## 2021-04-18 DIAGNOSIS — E785 Hyperlipidemia, unspecified: Secondary | ICD-10-CM

## 2021-04-18 DIAGNOSIS — E1165 Type 2 diabetes mellitus with hyperglycemia: Secondary | ICD-10-CM | POA: Diagnosis not present

## 2021-04-18 DIAGNOSIS — E669 Obesity, unspecified: Secondary | ICD-10-CM | POA: Diagnosis not present

## 2021-04-18 LAB — POCT GLYCOSYLATED HEMOGLOBIN (HGB A1C): Hemoglobin A1C: 6.3 % — AB (ref 4.0–5.6)

## 2021-04-18 NOTE — Patient Instructions (Addendum)
Please continue: - Metformin ER 1500 mg at night - Rybelsus 7 mg before b'fast - Farxiga 10 mg before b'fast - Glipizide 5 mg as needed before a larger meal  Please return in 4 months with your sugar log.

## 2021-04-18 NOTE — Progress Notes (Signed)
Patient ID: Derrick Brown, male   DOB: Jul 14, 1951, 70 y.o.   MRN: [id number]   This visit occurred during the SARS-CoV-2 public health emergency.  Safety protocols were in place, including screening questions prior to the visit, additional usage of staff PPE, and extensive cleaning of exam room while observing appropriate contact time as indicated for disinfecting solutions.   HPI: Derrick Brown is a 70 y.o.-year-old male, returning for follow-up for DM2, dx in ~2007, non-insulin-dependent, now more controlled, without long term complications. Last visit 4 months ago. He has a new insurance.  He is not on Medicare yet.  Interim history: No increased urination, blurry vision, nausea, chest pain. We retried Rybelsus at last visit and he is now tolerating it well.  Reviewed HbA1c levels: Lab Results  Component Value Date   HGBA1C 6.7 (A) 12/10/2020   HGBA1C 6.4 (A) 08/07/2020   HGBA1C 6.5 (H) 07/15/2020   Pt is on a regimen of: - Metformin ER  750 mg twice a day with meals >> 1500 mg with dinner >> nausea >> 750 mg twice a day >> 1500 mg at dinnertime - Januvia 100 mg before breakfast >> Rybelsus 7 mg before breakfast - Invokana 100 mg  before breakfast >> Farxiga 10 mg before breakfast - Glipizide 5 mg as needed before a larger meal  In 02/2019 we tried Rybelsus but we had to stop due to GI side effects and sugars were also more fluctuating.  We restarted Januvia afterwards.  Pt checks his sugars twice a day per review of his log: - am: 108, 121-172, 192 >> 97-165, 173 >> 89-150, 155, 168 - 2h after b'fast: n/c >> 198, 200 >> n/c - before lunch: n/c - 2h after lunch: n/c - before dinner: n/c - 2h after dinner: n/c - bedtime: 03-183, 228 >> 87, 100-175, 203 >> 86-170 >> 77, 82, 110-178, 189 - nighttime: n/c Lowest sugar was 93 >> 93 >> 87 >> 77. Highest sugar was 185 >> 228 >> 203 >> 189.  Glucometer: Freestyle Lite  Pt's meals are: - Breakfast: oatmeal + blueberries + pecan;  cereals; fruit juice; coffee - Lunch: chicken salad or sandwich - late - Dinner: cereal or light dinner - late >> eating out more - Snacks: takes snack boxes to work: yoghurt, veggies, grapes/prunes, chips/cheetos No formal exercise.  No CKD, last BUN/creatinine:  Lab Results  Component Value Date   BUN 17 07/15/2020   BUN 16 02/23/2020   CREATININE 0.72 07/15/2020   CREATININE 0.75 02/23/2020  On losartan.  No MAU: Lab Results  Component Value Date   MICRALBCREAT 1.3 02/23/2020   MICRALBCREAT 1.3 11/30/2018   MICRALBCREAT 1.4 07/04/2018   MICRALBCREAT 1.2 06/30/2017   MICRALBCREAT 1.5 07/28/2016   MICRALBCREAT 1.0 10/18/2014   MICRALBCREAT 1.1 04/23/2014   MICRALBCREAT 0.8 09/08/2013   MICRALBCREAT 0.9 03/07/2010   MICRALBCREAT 11.5 02/14/2009   + HL; last set of lipids: Lab Results  Component Value Date   CHOL 162 07/15/2020   CHOL 162 02/23/2020   CHOL 161 07/05/2019   Lab Results  Component Value Date   HDL 25 (L) 07/15/2020   HDL 24.70 (L) 02/23/2020   HDL 27.40 (L) 07/05/2019   Lab Results  Component Value Date   [facility]  07/15/2020     Comment:     . LDL cholesterol not calculated. Triglyceride levels greater than 400 mg/dL invalidate calculated LDL results. . Reference range: <100 . Desirable range <100 mg/dL for primary prevention;   <  70 mg/dL for patients with CHD or diabetic patients  with > or = 2 CHD risk factors. Marland Kitchen LDL-C is now calculated using the Martin-Hopkins  calculation, which is a validated novel method providing  better accuracy than the Friedewald equation in the  estimation of LDL-C.  Cresenciano Genre et al. Annamaria Helling. 1610;960(45): 2061-2068  (http://education.[facility].com/faq/FAQ164)    LDLCALC 74 07/04/2018   LDLCALC 75 09/08/2013   Lab Results  Component Value Date   TRIG 468 (H) 07/15/2020   TRIG 383.0 (H) 02/23/2020   TRIG 389.0 (H) 07/05/2019   Lab Results  Component Value Date   CHOLHDL 6.5 (H) 07/15/2020   CHOLHDL  7 02/23/2020   CHOLHDL 6 07/05/2019   Lab Results  Component Value Date   LDLDIRECT 83.0 02/23/2020   LDLDIRECT 78.0 07/05/2019   LDLDIRECT 84.0 11/30/2018  On Lipitor 20 every other day and fish oil 2000 g twice a day. Fenofibrate 48 mg daily.  Vascepa was not covered by his insurance.  - last eye exam was: 03/2021: reportedly No DR, + incipient cataracts. Dr. Gershon Crane.  - no numbness and tingling in his feet.    She also has HTN, GERD.  ROS: Constitutional: no weight gain/no weight loss, no fatigue, no subjective hyperthermia, no subjective hypothermia Eyes: no blurry vision, no xerophthalmia ENT: no sore throat, no nodules palpated in neck, no dysphagia, no odynophagia, no hoarseness Cardiovascular: no CP/no SOB/no palpitations/no leg swelling Respiratory: no cough/no SOB/no wheezing Gastrointestinal: no N/no V/no D/no C/no acid reflux Musculoskeletal: no muscle aches/no joint aches Skin: no rashes, no hair loss Neurological: no tremors/no numbness/no tingling/no dizziness  I reviewed pt's medications, allergies, PMH, social hx, family hx, and changes were documented in the history of present illness. Otherwise, unchanged from my initial visit note.  Past Medical History:  Diagnosis Date   ACNE ROSACEA 02/21/2009   Allergy    COUGH DUE TO ACE INHIBITORS 04/12/2009   Diabetes mellitus without complication ([place])    type 2   DYSPHAGIA 05/15/2009   ESOPHAGITIS, REFLUX 05/13/2009   FATIGUE 03/14/2010   Fatty liver    GERD (gastroesophageal reflux disease)    HYPERGLYCEMIA 02/21/2009   HYPERLIPIDEMIA 02/21/2009   HYPERTENSION NEC 02/21/2009   INSOMNIA 04/02/2010   MUSCLE PAIN 05/13/2009   OBSTRUCTIVE SLEEP APNEA 04/02/2010   RENAL CALCULUS 02/21/2009   Sleep apnea    TEMPOROMANDIBULAR JOINT DISORDER 06/10/2009   Unspecified hypertrophic and atrophic condition of skin    Past Surgical History:  Procedure Laterality Date   COLONOSCOPY     ESOPHAGOGASTRODUODENOSCOPY     KIDNEY STONE  SURGERY     removal   TONSILLECTOMY AND ADENOIDECTOMY     Social History   Social History   Marital status: Married    Spouse name: N/A   Social History Main Topics   Smoking status: Never Smoker   Smokeless tobacco: Never Used   Alcohol use 1.7 oz/week    1 burbon   Drug use: No   Current Outpatient Medications on File Prior to Visit  Medication Sig Dispense Refill   amLODipine (NORVASC) 5 MG tablet TAKE 1 TABLET BY MOUTH EVERY DAY 90 tablet 0   aspirin 81 MG tablet Take 81 mg by mouth daily.     atorvastatin (LIPITOR) 20 MG tablet TAKE 1 TABLET EVERY OTHER DAY 90 tablet 3   betamethasone dipropionate 0.05 % cream APPLY TO AFFECTED AREA TWICE A DAY 30 g 0   dapagliflozin propanediol (FARXIGA) 10 MG TABS tablet Take 1  tablet (10 mg total) by mouth daily before breakfast. 90 tablet 0   fenofibrate (TRICOR) 48 MG tablet TAKE 1 TABLET BY MOUTH EVERY DAY 90 tablet 0   glipiZIDE (GLUCOTROL) 5 MG tablet TAKE 1 TABLET BY MOUTH EVERY DAY BEFORE SUPPER 90 tablet 3   losartan (COZAAR) 100 MG tablet TAKE 1 TABLET BY MOUTH EVERY DAY 90 tablet 1   metFORMIN (GLUCOPHAGE-XR) 750 MG 24 hr tablet TAKE 1 TABLET BY MOUTH TWICE A DAY 180 tablet 3   Multiple Vitamin (MULTIVITAMIN) tablet Take 1 tablet by mouth daily.     Omega-3 Fatty Acids (FISH OIL) 1000 MG CAPS Take 1,000 mg by mouth 2 (two) times daily.     OVER THE COUNTER MEDICATION Vitamin C 500 mg, one tablet daily.     OVER THE COUNTER MEDICATION Cinnamon 1000 mg one tablet daily.     OVER THE COUNTER MEDICATION CQ10 one tablet daily.     pantoprazole (PROTONIX) 40 MG tablet TAKE 1 TABLET BY MOUTH EVERY DAY 90 tablet 0   Saw Palmetto 450 MG CAPS Take by mouth daily.     Semaglutide (RYBELSUS) 7 MG TABS Take 7 mg by mouth daily before breakfast. 90 tablet 3   traZODone (DESYREL) 50 MG tablet TAKE 1/2 TO 1 TABLET BY MOUTH AT BEDTIME AS NEEDED FOR SLEEP 90 tablet 1   triamcinolone cream (KENALOG) 0.1 % Apply topically 2 (two) times daily as  needed. 30 g 0   No current facility-administered medications on file prior to visit.   Allergies  Allergen Reactions   Hctz [Hydrochlorothiazide] Hives, Itching and Rash   Family History  Problem Relation Age of Onset   Stroke Mother 42   Rectal cancer Sister    Heart attack Father 10   Healthy Brother    Colon polyps Sister    Heart disease Other        family histrory   Diabetes Paternal Grandmother    Colon cancer Neg Hx    Esophageal cancer Neg Hx    Liver cancer Neg Hx    Pancreatic cancer Neg Hx    Stomach cancer Neg Hx    PE: BP 126/80   Pulse (!) 101   Ht 6\' 1"  (1.854 m)   Wt 235 lb (106.6 kg)   SpO2 96%   BMI 31.00 kg/m  Wt Readings from Last 3 Encounters:  04/18/21 235 lb (106.6 kg)  12/10/20 236 lb 12.8 oz (107.4 kg)  08/07/20 235 lb 12.8 oz (107 kg)   Constitutional: overweight, in NAD Eyes: PERRLA, EOMI, no exophthalmos ENT: moist mucous membranes, no thyromegaly, no cervical lymphadenopathy Cardiovascular: tachycardia, RR, No MRG Respiratory: CTA B Gastrointestinal: abdomen soft, NT, ND, BS+ Musculoskeletal: no deformities, strength intact in all 4 Skin: moist, warm, no rashes Neurological: no tremor with outstretched hands, DTR normal in all 4  ASSESSMENT: 1. DM2, non-insulin-dependent, uncontrolled, without long term complications, but with hyperglycemia  2. Obesity class 1  3. HL  PLAN:  1. Patient with longstanding, previously uncontrolled type 2 diabetes, with much improved control after starting an SGLT2 inhibitor.  Since last visit, his insurance stopped covering [place] so he is now [place].  We tried to switch from Januvia to Rybelsus and he initially could not tolerate it due to GI symptoms, but retrospectively, at the same time, he had COVID and at last visit, he felt that his symptoms may have been related to the infection.  At last visit we discussed about possibly retrying Rybelsus  again.  HbA1c at that time was slightly higher, at  6.7% and he had occasional hyperglycemic spikes in the 160-170s in the morning. -At today's visit, he tells me that he is tolerating Rybelsus well, without any GI symptoms.  His sugars have improved and they are mostly at goal in the morning with only occasional values in the 140s to 150s especially if he is eating out the night before.  He does take glipizide before these meals.  Later in the day, sugars are at goal.  For now, we will continue the same regimen, but I did advise him that if the sugars start increasing, he can double up on the Rybelsus and let me know if he needs a prescription for the 40 mg tablet. - I suggested to:  Patient Instructions  Please continue: - Metformin ER 1500 mg at night - Rybelsus 7 mg before b'fast - Farxiga 10 mg before b'fast - Glipizide 5 mg as needed before a larger meal  Please return in 4 months with your sugar log.   - we checked his HbA1c: 6.3% (lower) - advised to check sugars at different times of the day - 1x a day, rotating check times - advised for yearly eye exams >> he is UTD - return to clinic in 4 months   2. Obesity class 1 -We will continue his SGLT2 inhibitor, which should also help with weight loss.  Since last visit he had to switch from [place] to [place] per insurance preference. -I started him on Rybelsus in the past, however, he could not tolerate this so he went back to Januvia, which is weight neutral.  At last visit we again tried to switch to Rybelsus since his previous GI symptoms could have been related to COVID-19 infection.  Indeed, at this visit, he tells me that he is tolerating Rybelsus well.  He only lost 1 pound since last visit.  He inquires about Wegovy, and I explained that Rybelsus is the oral, lower dose version of this.  3. HL -Reviewed his latest lipid panel from 06/2020: Triglycerides very high, HDL low, LDL above 70: Lab Results  Component Value Date   CHOL 162 07/15/2020   HDL 25 (L) 07/15/2020   [place]   07/15/2020     Comment:     . LDL cholesterol not calculated. Triglyceride levels greater than 400 mg/dL invalidate calculated LDL results. . Reference range: <100 . Desirable range <100 mg/dL for primary prevention;   <70 mg/dL for patients with CHD or diabetic patients  with > or = 2 CHD risk factors. Marland Kitchen LDL-C is now calculated using the Martin-Hopkins  calculation, which is a validated novel method providing  better accuracy than the Friedewald equation in the  estimation of LDL-C.  Cresenciano Genre et al. Annamaria Helling. 1962;229(79): 2061-2068  (http://education.[facility].com/faq/FAQ164)    LDLDIRECT 83.0 02/23/2020   TRIG 468 (H) 07/15/2020   CHOLHDL 6.5 (H) 07/15/2020  -Continues Lipitor 20 mg every other day, without side effects -He is also on fish oil 2000 mg twice a day and fenofibrate 48 mg daily.   Philemon Kingdom, MD PhD [facility] Endocrinology

## 2021-04-28 ENCOUNTER — Encounter: Payer: Self-pay | Admitting: Family Medicine

## 2021-04-30 ENCOUNTER — Other Ambulatory Visit: Payer: Self-pay | Admitting: Internal Medicine

## 2021-04-30 DIAGNOSIS — E669 Obesity, unspecified: Secondary | ICD-10-CM

## 2021-05-02 ENCOUNTER — Other Ambulatory Visit: Payer: Self-pay | Admitting: Family Medicine

## 2021-05-02 DIAGNOSIS — E7801 Familial hypercholesterolemia: Secondary | ICD-10-CM

## 2021-05-21 ENCOUNTER — Other Ambulatory Visit: Payer: Self-pay | Admitting: Family Medicine

## 2021-05-21 DIAGNOSIS — E7801 Familial hypercholesterolemia: Secondary | ICD-10-CM

## 2021-05-29 ENCOUNTER — Other Ambulatory Visit: Payer: Self-pay | Admitting: Family Medicine

## 2021-06-14 ENCOUNTER — Other Ambulatory Visit: Payer: Self-pay | Admitting: Family Medicine

## 2021-07-02 ENCOUNTER — Encounter: Payer: Self-pay | Admitting: Family Medicine

## 2021-07-02 ENCOUNTER — Other Ambulatory Visit: Payer: Self-pay

## 2021-07-02 ENCOUNTER — Ambulatory Visit (INDEPENDENT_AMBULATORY_CARE_PROVIDER_SITE_OTHER): Payer: No Typology Code available for payment source | Admitting: Family Medicine

## 2021-07-02 VITALS — BP 128/62 | HR 86 | Temp 97.8°F | Ht 72.5 in | Wt 230.9 lb

## 2021-07-02 DIAGNOSIS — I10 Essential (primary) hypertension: Secondary | ICD-10-CM

## 2021-07-02 DIAGNOSIS — Z86010 Personal history of colonic polyps: Secondary | ICD-10-CM

## 2021-07-02 DIAGNOSIS — E785 Hyperlipidemia, unspecified: Secondary | ICD-10-CM | POA: Diagnosis not present

## 2021-07-02 DIAGNOSIS — Z0000 Encounter for general adult medical examination without abnormal findings: Secondary | ICD-10-CM | POA: Diagnosis not present

## 2021-07-02 DIAGNOSIS — Z23 Encounter for immunization: Secondary | ICD-10-CM

## 2021-07-02 DIAGNOSIS — E1165 Type 2 diabetes mellitus with hyperglycemia: Secondary | ICD-10-CM | POA: Diagnosis not present

## 2021-07-02 LAB — CBC WITH DIFFERENTIAL/PLATELET
Basophils Absolute: 0.1 10*3/uL (ref 0.0–0.1)
Basophils Relative: 1 % (ref 0.0–3.0)
Eosinophils Absolute: 0.5 10*3/uL (ref 0.0–0.7)
Eosinophils Relative: 7.3 % — ABNORMAL HIGH (ref 0.0–5.0)
HCT: 46.4 % (ref 39.0–52.0)
Hemoglobin: 15.7 g/dL (ref 13.0–17.0)
Lymphocytes Relative: 26.2 % (ref 12.0–46.0)
Lymphs Abs: 1.7 10*3/uL (ref 0.7–4.0)
MCHC: 33.8 g/dL (ref 30.0–36.0)
MCV: 91.6 fl (ref 78.0–100.0)
Monocytes Absolute: 0.6 10*3/uL (ref 0.1–1.0)
Monocytes Relative: 9.5 % (ref 3.0–12.0)
Neutro Abs: 3.7 10*3/uL (ref 1.4–7.7)
Neutrophils Relative %: 56 % (ref 43.0–77.0)
Platelets: 263 10*3/uL (ref 150.0–400.0)
RBC: 5.06 Mil/uL (ref 4.22–5.81)
RDW: 14.1 % (ref 11.5–15.5)
WBC: 6.5 10*3/uL (ref 4.0–10.5)

## 2021-07-02 LAB — MICROALBUMIN / CREATININE URINE RATIO
Creatinine,U: 53.3 mg/dL
Microalb Creat Ratio: 1.3 mg/g (ref 0.0–30.0)
Microalb, Ur: <0.7 mg/dL (ref 0.0–1.9)

## 2021-07-02 LAB — COMPREHENSIVE METABOLIC PANEL
ALT: 35 U/L (ref 0–53)
AST: 18 U/L (ref 0–37)
Albumin: 4.4 g/dL (ref 3.5–5.2)
Alkaline Phosphatase: 63 U/L (ref 39–117)
BUN: 13 mg/dL (ref 6–23)
CO2: 28 mEq/L (ref 19–32)
Calcium: 10.6 mg/dL — ABNORMAL HIGH (ref 8.4–10.5)
Chloride: 102 mEq/L (ref 96–112)
Creatinine, Ser: 0.83 mg/dL (ref 0.40–1.50)
GFR: 90.02 mL/min (ref >60.00)
Glucose, Bld: 152 mg/dL — ABNORMAL HIGH (ref 70–99)
Potassium: 4.3 mEq/L (ref 3.5–5.1)
Sodium: 140 mEq/L (ref 135–145)
Total Bilirubin: 0.5 mg/dL (ref 0.2–1.2)
Total Protein: 6.5 g/dL (ref 6.0–8.3)

## 2021-07-02 LAB — LDL CHOLESTEROL, DIRECT: Direct LDL: 89 mg/dL

## 2021-07-02 LAB — LIPID PANEL
Cholesterol: 145 mg/dL (ref 0–200)
HDL: 27.8 mg/dL — ABNORMAL LOW (ref >39.00)
NonHDL: 117.39
Total CHOL/HDL Ratio: 5
Triglycerides: 299 mg/dL — ABNORMAL HIGH (ref 0.0–149.0)
VLDL: 59.8 mg/dL — ABNORMAL HIGH (ref 0.0–40.0)

## 2021-07-02 MED ORDER — TRAZODONE HCL 100 MG PO TABS: 100.0000 mg | ORAL_TABLET | Freq: Every day | ORAL | 1 refills | Status: DC

## 2021-07-02 NOTE — Progress Notes (Signed)
Derrick Brown DOB: 27-Feb-1951 Encounter date: 07/02/2021  This is a 70 y.o. male who presents for complete physical   History of present illness/Additional concerns: Follows with endocrinology for diabetes.  Currently taking metformin ER 1500 mg at bedtime, Rybelsus 7 mg daily, Farxiga 10 mg daily, glipizide 5 mg daily. No issues with low blood sugars.   Hypertension: Amlodipine 5 mg, losartan 100 mg. Hyperlipidemia: Lipitor 20 mg, fenofibrate 48 mg  Does follow regularly with Dr. Gershon Crane for eye exams.  Due for repeat colonoscopy in April 2024. Follows regularly with [facility] dermatology. Skin exam has been stable.   Feels like he falls asleep pretty quickly, but wakes at 3-4 am. Did better when he took 1.5 of the trazodone ('75mg'$ ).   Takes the protonix every other day. Doesn't have gerd sx on regular basis.   Past Medical History:  Diagnosis Date  . ACNE ROSACEA 02/21/2009  . Allergy   . COUGH DUE TO ACE INHIBITORS 04/12/2009  . Diabetes mellitus without complication ([place])    type 2  . DYSPHAGIA 05/15/2009  . ESOPHAGITIS, REFLUX 05/13/2009  . FATIGUE 03/14/2010  . Fatty liver   . GERD (gastroesophageal reflux disease)   . HYPERGLYCEMIA 02/21/2009  . HYPERLIPIDEMIA 02/21/2009  . HYPERTENSION NEC 02/21/2009  . INSOMNIA 04/02/2010  . MUSCLE PAIN 05/13/2009  . OBSTRUCTIVE SLEEP APNEA 04/02/2010  . RENAL CALCULUS 02/21/2009  . Sleep apnea   . TEMPOROMANDIBULAR JOINT DISORDER 06/10/2009  . Unspecified hypertrophic and atrophic condition of skin    Past Surgical History:  Procedure Laterality Date  . COLONOSCOPY    . ESOPHAGOGASTRODUODENOSCOPY    . KIDNEY STONE SURGERY     removal  . TONSILLECTOMY AND ADENOIDECTOMY     Allergies  Allergen Reactions  . Hctz [Hydrochlorothiazide] Hives, Itching and Rash   Current Meds  Medication Sig  . amLODipine (NORVASC) 5 MG tablet TAKE 1 TABLET BY MOUTH EVERY DAY  . aspirin 81 MG tablet Take 81 mg by mouth daily.  Marland Kitchen atorvastatin (LIPITOR) 20 MG  tablet TAKE 1 TABLET BY MOUTH EVERY OTHER DAY  . betamethasone dipropionate 0.05 % cream APPLY TO AFFECTED AREA TWICE A DAY  . FARXIGA 10 MG TABS tablet TAKE 1 TABLET BY MOUTH DAILY BEFORE BREAKFAST.  . fenofibrate (TRICOR) 48 MG tablet TAKE 1 TABLET BY MOUTH EVERY DAY  . glipiZIDE (GLUCOTROL) 5 MG tablet TAKE 1 TABLET BY MOUTH EVERY DAY BEFORE SUPPER  . losartan (COZAAR) 100 MG tablet TAKE 1 TABLET BY MOUTH EVERY DAY  . metFORMIN (GLUCOPHAGE-XR) 750 MG 24 hr tablet TAKE 1 TABLET BY MOUTH TWICE A DAY  . Multiple Vitamin (MULTIVITAMIN) tablet Take 1 tablet by mouth daily.  . Omega-3 Fatty Acids (FISH OIL) 1000 MG CAPS Take 1,000 mg by mouth 2 (two) times daily.  Marland Kitchen OVER THE COUNTER MEDICATION Vitamin C 500 mg, one tablet daily.  Marland Kitchen OVER THE COUNTER MEDICATION Cinnamon 1000 mg one tablet daily.  Marland Kitchen OVER THE COUNTER MEDICATION CQ10 one tablet daily.  . pantoprazole (PROTONIX) 40 MG tablet TAKE 1 TABLET BY MOUTH EVERY DAY  . Saw Palmetto 450 MG CAPS Take by mouth daily.  . Semaglutide (RYBELSUS) 7 MG TABS Take 7 mg by mouth daily before breakfast.  . traZODone (DESYREL) 100 MG tablet Take 1 tablet (100 mg total) by mouth at bedtime.  . triamcinolone cream (KENALOG) 0.1 % Apply topically 2 (two) times daily as needed.  . [DISCONTINUED] traZODone (DESYREL) 50 MG tablet TAKE 1/2 TO 1 TABLET BY MOUTH  AT BEDTIME AS NEEDED FOR SLEEP   Social History   Tobacco Use  . Smoking status: Never  . Smokeless tobacco: Never  Substance Use Topics  . Alcohol use: Yes    Alcohol/week: 3.0 standard drinks    Types: 2 Glasses of wine, 1 Standard drinks or equivalent per week   Family History  Problem Relation Age of Onset  . Stroke Mother 50  . Rectal cancer Sister   . Heart attack Father 41  . Healthy Brother   . Colon polyps Sister   . Heart disease Other        family histrory  . Diabetes Paternal Grandmother   . Colon cancer Neg Hx   . Esophageal cancer Neg Hx   . Liver cancer Neg Hx   .  Pancreatic cancer Neg Hx   . Stomach cancer Neg Hx      Review of Systems  Constitutional:  Negative for activity change, appetite change, chills, fatigue, fever and unexpected weight change.  HENT:  Negative for congestion, ear pain, hearing loss, sinus pressure, sinus pain, sore throat and trouble swallowing.   Eyes:  Negative for pain and visual disturbance.  Respiratory:  Negative for cough, chest tightness, shortness of breath and wheezing.   Cardiovascular:  Negative for chest pain, palpitations and leg swelling.  Gastrointestinal:  Negative for abdominal distention, abdominal pain, blood in stool, constipation, diarrhea, nausea and vomiting.  Genitourinary:  Negative for decreased urine volume, difficulty urinating, dysuria, penile pain and testicular pain.  Musculoskeletal:  Positive for arthralgias (right knee hurts on and off). Negative for back pain and joint swelling.  Skin:  Negative for rash.  Neurological:  Negative for dizziness, weakness, numbness and headaches.  Hematological:  Negative for adenopathy. Does not bruise/bleed easily.  Psychiatric/Behavioral:  Negative for agitation, sleep disturbance and suicidal ideas. The patient is not nervous/anxious.    CBC:  Lab Results  Component Value Date   WBC 7.2 07/15/2020   HGB 16.0 07/15/2020   HCT 47.3 07/15/2020   MCH 31.0 07/15/2020   MCHC 33.8 07/15/2020   RDW 12.9 07/15/2020   PLT 254 07/15/2020   MPV 10.3 07/15/2020   CMP: Lab Results  Component Value Date   NA 138 07/15/2020   K 4.1 07/15/2020   CL 103 07/15/2020   CO2 24 07/15/2020   GLUCOSE 181 (H) 07/15/2020   BUN 17 07/15/2020   CREATININE 0.72 07/15/2020   GFRAA >89 05/26/2017   CALCIUM 10.1 07/15/2020   PROT 6.5 07/15/2020   BILITOT 0.5 07/15/2020   ALKPHOS 82 02/23/2020   ALT 35 07/15/2020   AST 18 07/15/2020   LIPID: Lab Results  Component Value Date   CHOL 162 07/15/2020   TRIG 468 (H) 07/15/2020   HDL 25 (L) 07/15/2020   [place]   07/15/2020     Comment:     . LDL cholesterol not calculated. Triglyceride levels greater than 400 mg/dL invalidate calculated LDL results. . Reference range: <100 . Desirable range <100 mg/dL for primary prevention;   <70 mg/dL for patients with CHD or diabetic patients  with > or = 2 CHD risk factors. Marland Kitchen LDL-C is now calculated using the Martin-Hopkins  calculation, which is a validated novel method providing  better accuracy than the Friedewald equation in the  estimation of LDL-C.  Cresenciano Genre et al. Annamaria Helling. [id number]): 2061-2068  (http://education.[facility].com/faq/FAQ164)     Objective:  BP 128/62 (BP Location: Left Arm, Patient Position: Sitting, Cuff Size: Large)   Pulse  86   Temp 97.8 F (36.6 C) (Rectal)   Ht 6' 0.5" (1.842 m)   Wt 230 lb 14.4 oz (104.7 kg)   SpO2 97%   BMI 30.89 kg/m   Weight: 230 lb 14.4 oz (104.7 kg)   BP Readings from Last 3 Encounters:  07/02/21 128/62  04/18/21 126/80  12/10/20 120/82   Wt Readings from Last 3 Encounters:  07/02/21 230 lb 14.4 oz (104.7 kg)  04/18/21 235 lb (106.6 kg)  12/10/20 236 lb 12.8 oz (107.4 kg)    Physical Exam Constitutional:      General: He is not in acute distress.    Appearance: He is well-developed.  HENT:     Head: Normocephalic and atraumatic.     Right Ear: External ear normal.     Left Ear: External ear normal.     Nose: Nose normal.     Mouth/Throat:     Pharynx: No oropharyngeal exudate.  Eyes:     Conjunctiva/sclera: Conjunctivae normal.     Pupils: Pupils are equal, round, and reactive to light.  Neck:     Thyroid: No thyromegaly.  Cardiovascular:     Rate and Rhythm: Normal rate and regular rhythm.     Heart sounds: Normal heart sounds. No murmur heard.   No friction rub. No gallop.  Pulmonary:     Effort: Pulmonary effort is normal. No respiratory distress.     Breath sounds: Normal breath sounds. No stridor. No wheezing or rales.  Abdominal:     General: Bowel sounds are  normal.     Palpations: Abdomen is soft.     Hernia: There is no hernia in the left inguinal area or right inguinal area.  Genitourinary:    Penis: Normal.      Testes: Normal.  Musculoskeletal:        General: Normal range of motion.     Cervical back: Neck supple.  Lymphadenopathy:     Lower Body: No right inguinal adenopathy. No left inguinal adenopathy.  Skin:    General: Skin is warm and dry.  Neurological:     Mental Status: He is alert and oriented to person, place, and time.  Psychiatric:        Behavior: Behavior normal.        Thought Content: Thought content normal.        Judgment: Judgment normal.    Assessment/Plan: Health Maintenance Due  Topic Date Due  . COVID-19 Vaccine (4 - Booster for [place] series) 10/07/2020  . INFLUENZA VACCINE  05/19/2021   Health Maintenance reviewed.  1. Preventative health care Discussed keeping up with exercise regularly. Suggested working on some strengthening exercises for knee support like squat/lunges.  2. Essential hypertension Well controlled. Continue with current medications.  - CBC with Differential/Platelet; Future - Comprehensive metabolic panel; Future  3. Hyperlipidemia, unspecified hyperlipidemia type He would like to minimize medications; will get bloodwork results and see if we can adjust lipid treatment. Previously vascepa wasn't covered by insurance, but he has changed plans. He has been taking fish oil since last labwork.  - Lipid panel; Future  4. Personal history of colonic adenoma Repeat colonoscopy due 2024  5. Type 2 diabetes mellitus with hyperglycemia, without long-term current use of insulin ([place]) Follows regularly with endocrinology. - [facility] DIABETES FOOT EXAM - Microalbumin / creatinine urine ratio; Future   Encouraged 4th covid booster. Return in about 1 year (around 07/02/2022) for physical exam.  Micheline Rough, MD

## 2021-07-02 NOTE — Addendum Note (Signed)
Addended by: Agnes Lawrence on: 07/02/2021 08:40 AM   Modules accepted: Orders

## 2021-07-02 NOTE — Addendum Note (Signed)
Addended by: Elmer Picker on: 07/02/2021 08:50 AM   Modules accepted: Orders

## 2021-07-03 ENCOUNTER — Other Ambulatory Visit: Payer: Self-pay | Admitting: Family Medicine

## 2021-07-13 ENCOUNTER — Other Ambulatory Visit: Payer: Self-pay | Admitting: Internal Medicine

## 2021-07-13 DIAGNOSIS — E669 Obesity, unspecified: Secondary | ICD-10-CM

## 2021-07-15 ENCOUNTER — Encounter: Payer: Self-pay | Admitting: Internal Medicine

## 2021-07-24 ENCOUNTER — Telehealth: Payer: Self-pay | Admitting: Pharmacy Technician

## 2021-07-24 ENCOUNTER — Other Ambulatory Visit (HOSPITAL_COMMUNITY): Payer: Self-pay

## 2021-07-24 NOTE — Telephone Encounter (Signed)
Patient Advocate Encounter   Received notification from CoverMyMeds that prior authorization for Invokana was cancelled.    This may mean either your patient does not have active coverage with this plan, this authorization was processed as a duplicate request, or an authorization was not needed for this medication.

## 2021-07-24 NOTE — Telephone Encounter (Signed)
Patient Advocate Encounter   Received notification from [place] that prior authorization for [name] is required.   PA submitted on 07/24/21 Key [id number] Status is pending    [facility] will continue to follow   Burney Gauze, CPhT [facility] Phone: [phone] Fax:  [phone]

## 2021-08-13 ENCOUNTER — Other Ambulatory Visit: Payer: Self-pay | Admitting: Family Medicine

## 2021-08-13 DIAGNOSIS — E7801 Familial hypercholesterolemia: Secondary | ICD-10-CM

## 2021-08-16 ENCOUNTER — Other Ambulatory Visit: Payer: Self-pay | Admitting: Family Medicine

## 2021-08-18 ENCOUNTER — Other Ambulatory Visit: Payer: Self-pay | Admitting: Family Medicine

## 2021-08-18 MED ORDER — BETAMETHASONE DIPROPIONATE 0.05 % EX CREA: TOPICAL_CREAM | Freq: Two times a day (BID) | CUTANEOUS | 0 refills | Status: DC

## 2021-09-04 ENCOUNTER — Ambulatory Visit: Payer: No Typology Code available for payment source | Admitting: Internal Medicine

## 2021-09-04 ENCOUNTER — Other Ambulatory Visit: Payer: Self-pay

## 2021-09-04 ENCOUNTER — Encounter: Payer: Self-pay | Admitting: Internal Medicine

## 2021-09-04 VITALS — BP 120/62 | HR 80 | Ht 72.5 in | Wt 229.6 lb

## 2021-09-04 DIAGNOSIS — E785 Hyperlipidemia, unspecified: Secondary | ICD-10-CM

## 2021-09-04 DIAGNOSIS — E669 Obesity, unspecified: Secondary | ICD-10-CM

## 2021-09-04 DIAGNOSIS — E1165 Type 2 diabetes mellitus with hyperglycemia: Secondary | ICD-10-CM

## 2021-09-04 LAB — POCT GLYCOSYLATED HEMOGLOBIN (HGB A1C): Hemoglobin A1C: 6.2 % — AB (ref 4.0–5.6)

## 2021-09-04 NOTE — Progress Notes (Signed)
Patient ID: Derrick Brown, male   DOB: 12/01/1950, 70 y.o.   MRN: [id number]   This visit occurred during the SARS-CoV-2 public health emergency.  Safety protocols were in place, including screening questions prior to the visit, additional usage of staff PPE, and extensive cleaning of exam room while observing appropriate contact time as indicated for disinfecting solutions.   HPI: Derrick Brown is a 70 y.o.-year-old male, returning for follow-up for DM2, dx in ~2007, non-insulin-dependent, now more controlled, without long term complications. Last visit 4.5 months ago. He has a new insurance.  He is not on Medicare yet.  Interim history: No increased urination, blurry vision, nausea, chest pain. He is preparing for spending the [place] with his family in [place].  Reviewed HbA1c levels: Lab Results  Component Value Date   HGBA1C 6.3 (A) 04/18/2021   HGBA1C 6.7 (A) 12/10/2020   HGBA1C 6.4 (A) 08/07/2020   Pt is on a regimen of: - Metformin ER 750 mg twice a day >> 1500 mg at dinnertime - Januvia 100 mg before breakfast >> Rybelsus 7 mg before breakfast -  before breakfast >> Farxiga 10 mg before breakfast - Glipizide 5 mg as needed before a larger meal - ~qod In 02/2019 we tried Rybelsus but we had to stop due to GI side effects and sugars were also more fluctuating.  We restarted Januvia afterwards.  Pt checks his sugars twice a day per review of his log: - am: 108, 121-172, 192 >> 97-165, 173 >> 89-150, 155, 168 >> 95-139, 147, 156 - 2h after b'fast: n/c >> 198, 200 >> n/c - before lunch: n/c - 2h after lunch: n/c - before dinner: n/c - 2h after dinner: n/c - bedtime: 87, 100-175, 203 >> 86-170 >> 77, 82, 110-178, 189 >> 79-169 - nighttime: n/c Lowest sugar was 93 >> 87 >> 77 >> 79. Highest sugar was 228 >> 203 >> 189 >> 171.  Glucometer: Freestyle Lite  Pt's meals are: - Breakfast: oatmeal + blueberries + pecan; cereals; fruit juice; coffee - Lunch: chicken salad or sandwich  - late - Dinner: cereal or light dinner - late >> eating out more - Snacks: takes snack boxes to work: yoghurt, veggies, grapes/prunes, chips/cheetos No formal exercise.  No CKD, last BUN/creatinine:  Lab Results  Component Value Date   BUN 13 07/02/2021   BUN 17 07/15/2020   CREATININE 0.83 07/02/2021   CREATININE 0.72 07/15/2020  On losartan.  No MAU: Lab Results  Component Value Date   MICRALBCREAT 1.3 07/02/2021   MICRALBCREAT 1.3 02/23/2020   MICRALBCREAT 1.3 11/30/2018   MICRALBCREAT 1.4 07/04/2018   MICRALBCREAT 1.2 06/30/2017   MICRALBCREAT 1.5 07/28/2016   MICRALBCREAT 1.0 10/18/2014   MICRALBCREAT 1.1 04/23/2014   MICRALBCREAT 0.8 09/08/2013   MICRALBCREAT 0.9 03/07/2010   + HL; last set of lipids: Lab Results  Component Value Date   CHOL 145 07/02/2021   CHOL 162 07/15/2020   CHOL 162 02/23/2020   Lab Results  Component Value Date   HDL 27.80 (L) 07/02/2021   HDL 25 (L) 07/15/2020   HDL 24.70 (L) 02/23/2020   Lab Results  Component Value Date   [facility]  07/15/2020     Comment:     . LDL cholesterol not calculated. Triglyceride levels greater than 400 mg/dL invalidate calculated LDL results. . Reference range: <100 . Desirable range <100 mg/dL for primary prevention;   <70 mg/dL for patients with CHD or diabetic patients  with > or = 2  CHD risk factors. Marland Kitchen LDL-C is now calculated using the Martin-Hopkins  calculation, which is a validated novel method providing  better accuracy than the Friedewald equation in the  estimation of LDL-C.  Cresenciano Genre et al. Annamaria Helling. 6045;409(81): 2061-2068  (http://education.[facility].com/faq/FAQ164)    LDLCALC 74 07/04/2018   LDLCALC 75 09/08/2013   Lab Results  Component Value Date   TRIG 299.0 (H) 07/02/2021   TRIG 468 (H) 07/15/2020   TRIG 383.0 (H) 02/23/2020   Lab Results  Component Value Date   CHOLHDL 5 07/02/2021   CHOLHDL 6.5 (H) 07/15/2020   CHOLHDL 7 02/23/2020   Lab Results  Component  Value Date   LDLDIRECT 89.0 07/02/2021   LDLDIRECT 83.0 02/23/2020   LDLDIRECT 78.0 07/05/2019  On Lipitor 20 every other day and fish oil 2000 mg twice a day. Fenofibrate 48 mg daily.  Vascepa was not covered by his insurance.  - last eye exam was: 03/2021: reportedly No DR, + incipient cataracts. Dr. Gershon Crane.  - no numbness and tingling in his feet.    She also has HTN, GERD.  ROS: + see HPI  I reviewed pt's medications, allergies, PMH, social hx, family hx, and changes were documented in the history of present illness. Otherwise, unchanged from my initial visit note.  Past Medical History:  Diagnosis Date   ACNE ROSACEA 02/21/2009   Allergy    COUGH DUE TO ACE INHIBITORS 04/12/2009   Diabetes mellitus without complication ([place])    type 2   DYSPHAGIA 05/15/2009   ESOPHAGITIS, REFLUX 05/13/2009   FATIGUE 03/14/2010   Fatty liver    GERD (gastroesophageal reflux disease)    HYPERGLYCEMIA 02/21/2009   HYPERLIPIDEMIA 02/21/2009   HYPERTENSION NEC 02/21/2009   INSOMNIA 04/02/2010   MUSCLE PAIN 05/13/2009   OBSTRUCTIVE SLEEP APNEA 04/02/2010   RENAL CALCULUS 02/21/2009   Sleep apnea    TEMPOROMANDIBULAR JOINT DISORDER 06/10/2009   Unspecified hypertrophic and atrophic condition of skin    Past Surgical History:  Procedure Laterality Date   COLONOSCOPY     ESOPHAGOGASTRODUODENOSCOPY     KIDNEY STONE SURGERY     removal   TONSILLECTOMY AND ADENOIDECTOMY     Social History   Social History   Marital status: Married    Spouse name: N/A   Social History Main Topics   Smoking status: Never Smoker   Smokeless tobacco: Never Used   Alcohol use 1.7 oz/week    1 burbon   Drug use: No   Current Outpatient Medications on File Prior to Visit  Medication Sig Dispense Refill   amLODipine (NORVASC) 5 MG tablet TAKE 1 TABLET BY MOUTH EVERY DAY 90 tablet 1   aspirin 81 MG tablet Take 81 mg by mouth daily.     atorvastatin (LIPITOR) 20 MG tablet TAKE 1 TABLET BY MOUTH EVERY OTHER DAY 45  tablet 1   betamethasone dipropionate 0.05 % cream Apply topically 2 (two) times daily. APPLY TO AFFECTED AREA TWICE A DAY 30 g 0   FARXIGA 10 MG TABS tablet TAKE 1 TABLET BY MOUTH EVERY DAY BEFORE BREAKFAST 90 tablet 0   fenofibrate (TRICOR) 48 MG tablet TAKE 1 TABLET BY MOUTH EVERY DAY 90 tablet 0   glipiZIDE (GLUCOTROL) 5 MG tablet TAKE 1 TABLET BY MOUTH EVERY DAY BEFORE SUPPER 90 tablet 3   losartan (COZAAR) 100 MG tablet TAKE 1 TABLET BY MOUTH EVERY DAY 90 tablet 1   metFORMIN (GLUCOPHAGE-XR) 750 MG 24 hr tablet TAKE 1 TABLET BY MOUTH TWICE A  DAY 180 tablet 3   Multiple Vitamin (MULTIVITAMIN) tablet Take 1 tablet by mouth daily.     Omega-3 Fatty Acids (FISH OIL) 1000 MG CAPS Take 1,000 mg by mouth 2 (two) times daily.     OVER THE COUNTER MEDICATION Vitamin C 500 mg, one tablet daily.     OVER THE COUNTER MEDICATION Cinnamon 1000 mg one tablet daily.     OVER THE COUNTER MEDICATION CQ10 one tablet daily.     pantoprazole (PROTONIX) 40 MG tablet TAKE 1 TABLET BY MOUTH EVERY DAY 90 tablet 1   Saw Palmetto 450 MG CAPS Take by mouth daily.     Semaglutide (RYBELSUS) 7 MG TABS Take 7 mg by mouth daily before breakfast. 90 tablet 3   traZODone (DESYREL) 100 MG tablet Take 1 tablet (100 mg total) by mouth at bedtime. 90 tablet 1   triamcinolone cream (KENALOG) 0.1 % Apply topically 2 (two) times daily as needed. 30 g 0   No current facility-administered medications on file prior to visit.   Allergies  Allergen Reactions   Hctz [Hydrochlorothiazide] Hives, Itching and Rash   Family History  Problem Relation Age of Onset   Stroke Mother 72   Rectal cancer Sister    Heart attack Father 70   Healthy Brother    Colon polyps Sister    Heart disease Other        family histrory   Diabetes Paternal Grandmother    Colon cancer Neg Hx    Esophageal cancer Neg Hx    Liver cancer Neg Hx    Pancreatic cancer Neg Hx    Stomach cancer Neg Hx    PE: BP 120/62 (BP Location: Left Arm, Patient  Position: Sitting, Cuff Size: Normal)   Pulse 80   Ht 6' 0.5" (1.842 m)   Wt 229 lb 9.6 oz (104.1 kg)   SpO2 96%   BMI 30.71 kg/m  Wt Readings from Last 3 Encounters:  09/04/21 229 lb 9.6 oz (104.1 kg)  07/02/21 230 lb 14.4 oz (104.7 kg)  04/18/21 235 lb (106.6 kg)   Constitutional: overweight, in NAD Eyes: PERRLA, EOMI, no exophthalmos ENT: moist mucous membranes, no thyromegaly, no cervical lymphadenopathy Cardiovascular: tachycardia, RR, No MRG Respiratory: CTA B Gastrointestinal: abdomen soft, NT, ND, BS+ Musculoskeletal: no deformities, strength intact in all 4 Skin: moist, warm, no rashes Neurological: no tremor with outstretched hands, DTR normal in all 4  ASSESSMENT: 1. DM2, non-insulin-dependent, uncontrolled, without long term complications, but with hyperglycemia  2. Obesity class 1  3. HL  PLAN:  1. Patient with longstanding, previously uncontrolled, type 2 diabetes, with much improved control after starting an SGLT2 inhibitor.  His insurance stopped covering [place], however, and he started to [place].  His sugars were mostly at goal in the morning at last visit with occasional spikes in the 140s to 150s if he was eating out the night before.  He is taking glipizide before such meals.  Later in the day, sugars were at goal. HbA1c was lower, at 6.3%.  We did not change his regimen. -At this visit, reviewing his blood sugar log, it appears that his sugars are improved both in the morning and at bedtime.  In fact, he has some blood sugars in the 70s and 80s at bedtime and upon questioning, he is taking glipizide approximately every other day before dinner.  I did advise him that he may not need 5 mg of glipizide for some meals and discussed about trying to  use only 2.5 mg instead to avoid low blood sugars at bedtime.  Otherwise, we can continue the current regimen.  He tolerates Rybelsus well. - I suggested to:  Patient Instructions  Please continue: - Metformin ER 1500  mg at night - Rybelsus 7 mg before b'fast - Farxiga 10 mg before b'fast  Change:  - Glipizide 2.5-5 mg as needed before a larger meal  Please return in 4 months with your sugar log.   - we checked his HbA1c: 6.2% (slightly lower) - advised to check sugars at different times of the day - 1x a day, rotating check times - advised for yearly eye exams >> he is UTD - return to clinic in 4 months   2. Obesity class 1 -We will continue his SGLT2 inhibitor and p.o. GLP-1 receptor agonist which should also help with weight loss -She was previously not able to tolerate Rybelsus due to GI symptoms, but we restarted this and not tolerated well.  Retrospectively, previous GI symptoms could be related to his COVID-19 infection. -He lost 6 pounds since last visit  3. HL -Reviewed latest lipid panel from 06/2021: LDL at goal but HDL and triglycerides outside the target range, but both improved: Lab Results  Component Value Date   CHOL 145 07/02/2021   HDL 27.80 (L) 07/02/2021   [place]  07/15/2020     Comment:     . LDL cholesterol not calculated. Triglyceride levels greater than 400 mg/dL invalidate calculated LDL results. . Reference range: <100 . Desirable range <100 mg/dL for primary prevention;   <70 mg/dL for patients with CHD or diabetic patients  with > or = 2 CHD risk factors. Marland Kitchen LDL-C is now calculated using the Martin-Hopkins  calculation, which is a validated novel method providing  better accuracy than the Friedewald equation in the  estimation of LDL-C.  Cresenciano Genre et al. Annamaria Helling. 5974;163(84): 2061-2068  (http://education.[facility].com/faq/FAQ164)    LDLDIRECT 89.0 07/02/2021   TRIG 299.0 (H) 07/02/2021   CHOLHDL 5 07/02/2021  -He continues Lipitor 20 mg every other day, fish oil 2000 mg twice a day and fenofibrate 48 mg daily-no side effects  Philemon Kingdom, MD PhD [facility]

## 2021-09-04 NOTE — Patient Instructions (Addendum)
Please continue: - Metformin ER 1500 mg at night - Rybelsus 7 mg before b'fast - Farxiga 10 mg before b'fast  Change:  - Glipizide 2.5-5 mg as needed before a larger meal  Please return in 4 months with your sugar log.

## 2021-09-09 ENCOUNTER — Other Ambulatory Visit: Payer: Self-pay | Admitting: Family Medicine

## 2021-09-09 DIAGNOSIS — I10 Essential (primary) hypertension: Secondary | ICD-10-CM

## 2021-09-10 ENCOUNTER — Other Ambulatory Visit: Payer: Self-pay | Admitting: Family Medicine

## 2021-10-09 ENCOUNTER — Other Ambulatory Visit: Payer: Self-pay | Admitting: Internal Medicine

## 2021-10-09 DIAGNOSIS — E669 Obesity, unspecified: Secondary | ICD-10-CM

## 2021-10-29 ENCOUNTER — Other Ambulatory Visit: Payer: Self-pay | Admitting: Family Medicine

## 2021-10-29 ENCOUNTER — Other Ambulatory Visit: Payer: Self-pay | Admitting: Internal Medicine

## 2021-10-29 DIAGNOSIS — E7801 Familial hypercholesterolemia: Secondary | ICD-10-CM

## 2021-11-02 ENCOUNTER — Other Ambulatory Visit: Payer: Self-pay | Admitting: Family Medicine

## 2021-12-27 ENCOUNTER — Other Ambulatory Visit: Payer: Self-pay | Admitting: Family Medicine

## 2022-01-03 ENCOUNTER — Other Ambulatory Visit: Payer: Self-pay | Admitting: Family Medicine

## 2022-01-03 ENCOUNTER — Other Ambulatory Visit: Payer: Self-pay | Admitting: Internal Medicine

## 2022-01-03 DIAGNOSIS — E669 Obesity, unspecified: Secondary | ICD-10-CM

## 2022-01-09 ENCOUNTER — Encounter: Payer: Self-pay | Admitting: Internal Medicine

## 2022-01-09 ENCOUNTER — Other Ambulatory Visit: Payer: Self-pay

## 2022-01-09 ENCOUNTER — Ambulatory Visit: Payer: No Typology Code available for payment source | Admitting: Internal Medicine

## 2022-01-09 VITALS — BP 138/78 | HR 81 | Ht 72.5 in | Wt 232.6 lb

## 2022-01-09 DIAGNOSIS — E669 Obesity, unspecified: Secondary | ICD-10-CM

## 2022-01-09 DIAGNOSIS — E1165 Type 2 diabetes mellitus with hyperglycemia: Secondary | ICD-10-CM

## 2022-01-09 DIAGNOSIS — E785 Hyperlipidemia, unspecified: Secondary | ICD-10-CM

## 2022-01-09 LAB — POCT GLYCOSYLATED HEMOGLOBIN (HGB A1C): Hemoglobin A1C: 6 % — AB (ref 4.0–5.6)

## 2022-01-09 MED ORDER — METFORMIN HCL ER 750 MG PO TB24: 750.0000 mg | ORAL_TABLET | Freq: Two times a day (BID) | ORAL | 3 refills | Status: DC

## 2022-01-09 MED ORDER — DAPAGLIFLOZIN PROPANEDIOL 10 MG PO TABS: ORAL_TABLET | ORAL | 3 refills | Status: DC

## 2022-01-09 NOTE — Patient Instructions (Addendum)
Please continue: - Metformin ER 1500 mg at night - Glipizide 2.5 mg as needed before a larger meal - Rybelsus 7 mg before b'fast - Farxiga 10 mg before b'fast  Please return in 6 months with your sugar log.  

## 2022-01-09 NOTE — Progress Notes (Signed)
Patient ID: Derrick Brown, male   DOB: 01-24-1951, 71 y.o.   MRN: [id number]  ? ?This visit occurred during the SARS-CoV-2 public health emergency.  Safety protocols were in place, including screening questions prior to the visit, additional usage of staff PPE, and extensive cleaning of exam room while observing appropriate contact time as indicated for disinfecting solutions.  ? ?HPI: ?Derrick Brown is a 71 y.o.-year-old male, returning for follow-up for DM2, dx in ~2007, non-insulin-dependent, now more controlled, without long term complications. Last visit 4 months ago. ? ?Interim history: ?No increased urination, blurry vision, nausea, chest pain. ?He feels well and has normal behavior has no complaints today. ? ?Reviewed HbA1c levels: ?Lab Results  ?Component Value Date  ? HGBA1C 6.2 (A) 09/04/2021  ? HGBA1C 6.3 (A) 04/18/2021  ? HGBA1C 6.7 (A) 12/10/2020  ? ?Pt is on a regimen of: ?- Metformin ER 750 mg twice a day >> 1500 mg at dinnertime ?- Januvia 100 mg before breakfast >> Rybelsus 7 mg before breakfast ?-  before breakfast >> Farxiga 10 mg before breakfast ?- Glipizide 5 mg as needed before a larger meal >> 2.5 mg QOD ?In 02/2019 we tried Rybelsus but we had to stop due to GI side effects and sugars were also more fluctuating.  We restarted Januvia afterwards. ? ?Pt checks his sugars twice a day per review of his log: ?- am: 97-165, 173 >> 89-150, 155, 168 >> 95-139, 147, 156 >> 86-131, 137 ?- 2h after b'fast: n/c >> 198, 200 >> n/c ?- before lunch: n/c ?- 2h after lunch: n/c ?- before dinner: n/c ?- 2h after dinner: n/c ?- bedtime: 86-170 >> 77, 82, 110-178, 189 >> 79-169 >> 63, 70, 91-154 ?- nighttime: n/c ?Lowest sugar was 93 >> 87 >> 77 >> 79 >> 63. ?Highest sugar was 228 >> 203 >> 189 >> 171 >> 154. ? ?Glucometer: Freestyle Lite ? ?Pt's meals are: ?- Breakfast: oatmeal + blueberries + pecan; cereals; fruit juice; coffee ?- Lunch: chicken salad or sandwich - late ?- Dinner: cereal or light dinner - late >>  eating out more ?- Snacks: takes snack boxes to work: yoghurt, veggies, grapes/prunes, chips/cheetos ?No formal exercise. ? ?No CKD, last BUN/creatinine:  ?Lab Results  ?Component Value Date  ? BUN 13 07/02/2021  ? BUN 17 07/15/2020  ? CREATININE 0.83 07/02/2021  ? CREATININE 0.72 07/15/2020  ?On losartan. ? ?No MAU: ?Lab Results  ?Component Value Date  ? MICRALBCREAT 1.3 07/02/2021  ? MICRALBCREAT 1.3 02/23/2020  ? MICRALBCREAT 1.3 11/30/2018  ? MICRALBCREAT 1.4 07/04/2018  ? MICRALBCREAT 1.2 06/30/2017  ? MICRALBCREAT 1.5 07/28/2016  ? MICRALBCREAT 1.0 10/18/2014  ? MICRALBCREAT 1.1 04/23/2014  ? MICRALBCREAT 0.8 09/08/2013  ? MICRALBCREAT 0.9 03/07/2010  ? ?+ HL; last set of lipids: ?Lab Results  ?Component Value Date  ? CHOL 145 07/02/2021  ? CHOL 162 07/15/2020  ? CHOL 162 02/23/2020  ? ?Lab Results  ?Component Value Date  ? HDL 27.80 (L) 07/02/2021  ? HDL 25 (L) 07/15/2020  ? HDL 24.70 (L) 02/23/2020  ? ?Lab Results  ?Component Value Date  ? [place]  07/15/2020  ?   Comment:  ?   . ?LDL cholesterol not calculated. Triglyceride levels ?greater than 400 mg/dL invalidate calculated LDL results. ?. ?Reference range: <100 ?Marland Kitchen ?Desirable range <100 mg/dL for primary prevention;   ?<70 mg/dL for patients with CHD or diabetic patients  ?with > or = 2 CHD risk factors. ?. ?LDL-C is now calculated  using the Martin-Hopkins  ?calculation, which is a validated novel method providing  ?better accuracy than the Friedewald equation in the  ?estimation of LDL-C.  ?Cresenciano Genre et al. Annamaria Helling. 2831;517(61): 2061-2068  ?(http://education.[facility].com/faq/FAQ164) ?  ? [place] 74 07/04/2018  ? [place] 75 09/08/2013  ? ?Lab Results  ?Component Value Date  ? TRIG 299.0 (H) 07/02/2021  ? TRIG 468 (H) 07/15/2020  ? TRIG 383.0 (H) 02/23/2020  ? ?Lab Results  ?Component Value Date  ? CHOLHDL 5 07/02/2021  ? CHOLHDL 6.5 (H) 07/15/2020  ? CHOLHDL 7 02/23/2020  ? ?Lab Results  ?Component Value Date  ? LDLDIRECT 89.0 07/02/2021  ?  LDLDIRECT 83.0 02/23/2020  ? LDLDIRECT 78.0 07/05/2019  ?On Lipitor 20 every other day and fish oil 2000 mg twice a day. Fenofibrate 48 mg daily.  Vascepa was not covered by his insurance. ? ?- last eye exam was: 03/2021: reportedly No DR, + incipient cataracts. Dr. Gershon Crane. ? ?- no numbness and tingling in his feet.  Foot exam 06/2021. ? ?She also has HTN, GERD. ? ?ROS: ?+ see HPI ? ?I reviewed pt's medications, allergies, PMH, social hx, family hx, and changes were documented in the history of present illness. Otherwise, unchanged from my initial visit note. ? ?Past Medical History:  ?Diagnosis Date  ? ACNE ROSACEA 02/21/2009  ? Allergy   ? COUGH DUE TO ACE INHIBITORS 04/12/2009  ? Diabetes mellitus without complication ([place])   ? type 2  ? DYSPHAGIA 05/15/2009  ? ESOPHAGITIS, REFLUX 05/13/2009  ? FATIGUE 03/14/2010  ? Fatty liver   ? GERD (gastroesophageal reflux disease)   ? HYPERGLYCEMIA 02/21/2009  ? HYPERLIPIDEMIA 02/21/2009  ? HYPERTENSION NEC 02/21/2009  ? INSOMNIA 04/02/2010  ? MUSCLE PAIN 05/13/2009  ? OBSTRUCTIVE SLEEP APNEA 04/02/2010  ? RENAL CALCULUS 02/21/2009  ? Sleep apnea   ? TEMPOROMANDIBULAR JOINT DISORDER 06/10/2009  ? Unspecified hypertrophic and atrophic condition of skin   ? ?Past Surgical History:  ?Procedure Laterality Date  ? COLONOSCOPY    ? ESOPHAGOGASTRODUODENOSCOPY    ? KIDNEY STONE SURGERY    ? removal  ? TONSILLECTOMY AND ADENOIDECTOMY    ? ?Social History  ? ?Social History  ? Marital status: Married  ?  Spouse name: N/A  ? ?Social History Main Topics  ? Smoking status: Never Smoker  ? Smokeless tobacco: Never Used  ? Alcohol use 1.7 oz/week  ?  1 burbon  ? Drug use: No  ? ?Current Outpatient Medications on File Prior to Visit  ?Medication Sig Dispense Refill  ? betamethasone dipropionate 0.05 % cream APPLY TO AFFECTED AREA TWICE A DAY 30 g 1  ? traZODone (DESYREL) 100 MG tablet TAKE 1 TABLET BY MOUTH EVERYDAY AT BEDTIME 90 tablet 1  ? amLODipine (NORVASC) 5 MG tablet TAKE 1 TABLET BY MOUTH EVERY DAY  90 tablet 1  ? aspirin 81 MG tablet Take 81 mg by mouth daily.    ? atorvastatin (LIPITOR) 20 MG tablet TAKE 1 TABLET BY MOUTH EVERY OTHER DAY 45 tablet 2  ? FARXIGA 10 MG TABS tablet TAKE 1 TABLET BY MOUTH EVERY DAY BEFORE BREAKFAST 90 tablet 0  ? fenofibrate (TRICOR) 48 MG tablet TAKE 1 TABLET BY MOUTH EVERY DAY 90 tablet 1  ? glipiZIDE (GLUCOTROL) 5 MG tablet TAKE 1 TABLET BY MOUTH EVERY DAY BEFORE SUPPER 90 tablet 3  ? losartan (COZAAR) 100 MG tablet TAKE 1 TABLET BY MOUTH EVERY DAY 90 tablet 1  ? metFORMIN (GLUCOPHAGE-XR) 750 MG 24 hr tablet TAKE  1 TABLET BY MOUTH TWICE A DAY 180 tablet 3  ? Multiple Vitamin (MULTIVITAMIN) tablet Take 1 tablet by mouth daily.    ? Omega-3 Fatty Acids (FISH OIL) 1000 MG CAPS Take 2,000 mg by mouth 2 (two) times daily.      ? OVER THE COUNTER MEDICATION Vitamin C 500 mg, one tablet daily.    ? OVER THE COUNTER MEDICATION Cinnamon 1000 mg one tablet daily.    ? OVER THE COUNTER MEDICATION CQ10 one tablet daily.    ? pantoprazole (PROTONIX) 40 MG tablet TAKE 1 TABLET BY MOUTH EVERY DAY 90 tablet 1  ? RYBELSUS 7 MG TABS TAKE 1 TABLET BY MOUTH EVERY MORNING BEFORE BREAKFAST 90 tablet 3  ? Saw Palmetto 450 MG CAPS Take by mouth daily.    ? triamcinolone cream (KENALOG) 0.1 % Apply topically 2 (two) times daily as needed. 30 g 0  ? ?No current facility-administered medications on file prior to visit.  ? ?Allergies  ?Allergen Reactions  ? Hctz [Hydrochlorothiazide] Hives, Itching and Rash  ? ?Family History  ?Problem Relation Age of Onset  ? Stroke Mother 65  ? Rectal cancer Sister   ? Heart attack Father 96  ? Healthy Brother   ? Colon polyps Sister   ? Heart disease Other   ?     family histrory  ? Diabetes Paternal Grandmother   ? Colon cancer Neg Hx   ? Esophageal cancer Neg Hx   ? Liver cancer Neg Hx   ? Pancreatic cancer Neg Hx   ? Stomach cancer Neg Hx   ? ?PE: ?BP 138/78 (BP Location: Right Arm, Patient Position: Sitting, Cuff Size: Normal)   Pulse 81   Ht 6' 0.5" (1.842 m)    Wt 232 lb 9.6 oz (105.5 kg)   SpO2 97%   BMI 31.11 kg/m?  ?Wt Readings from Last 3 Encounters:  ?01/09/22 232 lb 9.6 oz (105.5 kg)  ?09/04/21 229 lb 9.6 oz (104.1 kg)  ?07/02/21 230 lb 14.4 oz (104.7 kg)  ? ?

## 2022-01-13 ENCOUNTER — Other Ambulatory Visit: Payer: Self-pay | Admitting: Family Medicine

## 2022-01-13 DIAGNOSIS — E7801 Familial hypercholesterolemia: Secondary | ICD-10-CM

## 2022-02-24 ENCOUNTER — Other Ambulatory Visit: Payer: Self-pay | Admitting: Family Medicine

## 2022-02-24 DIAGNOSIS — I10 Essential (primary) hypertension: Secondary | ICD-10-CM

## 2022-04-02 LAB — HM DIABETES EYE EXAM

## 2022-04-13 ENCOUNTER — Other Ambulatory Visit: Payer: Self-pay | Admitting: Internal Medicine

## 2022-04-13 DIAGNOSIS — E7801 Familial hypercholesterolemia: Secondary | ICD-10-CM

## 2022-05-01 ENCOUNTER — Other Ambulatory Visit: Payer: Self-pay | Admitting: *Deleted

## 2022-05-01 MED ORDER — BETAMETHASONE DIPROPIONATE 0.05 % EX CREA: TOPICAL_CREAM | CUTANEOUS | 1 refills | Status: AC

## 2022-05-01 MED ORDER — AMLODIPINE BESYLATE 5 MG PO TABS: 5.0000 mg | ORAL_TABLET | Freq: Every day | ORAL | 0 refills | Status: DC

## 2022-06-24 ENCOUNTER — Other Ambulatory Visit: Payer: Self-pay | Admitting: *Deleted

## 2022-06-24 MED ORDER — TRAZODONE HCL 100 MG PO TABS: ORAL_TABLET | ORAL | 0 refills | Status: DC

## 2022-07-03 ENCOUNTER — Encounter: Payer: Self-pay | Admitting: Family Medicine

## 2022-07-03 ENCOUNTER — Ambulatory Visit: Payer: No Typology Code available for payment source | Admitting: Family Medicine

## 2022-07-03 ENCOUNTER — Encounter: Payer: No Typology Code available for payment source | Admitting: Family Medicine

## 2022-07-03 VITALS — BP 116/60 | HR 82 | Temp 97.7°F | Ht 72.5 in | Wt 233.3 lb

## 2022-07-03 DIAGNOSIS — I10 Essential (primary) hypertension: Secondary | ICD-10-CM

## 2022-07-03 DIAGNOSIS — G4700 Insomnia, unspecified: Secondary | ICD-10-CM | POA: Diagnosis not present

## 2022-07-03 DIAGNOSIS — Z23 Encounter for immunization: Secondary | ICD-10-CM | POA: Diagnosis not present

## 2022-07-03 DIAGNOSIS — E1165 Type 2 diabetes mellitus with hyperglycemia: Secondary | ICD-10-CM

## 2022-07-03 LAB — MICROALBUMIN / CREATININE URINE RATIO
Creatinine,U: 54.7 mg/dL
Microalb Creat Ratio: 3.2 mg/g (ref 0.0–30.0)
Microalb, Ur: 1.7 mg/dL (ref 0.0–1.9)

## 2022-07-03 LAB — COMPREHENSIVE METABOLIC PANEL
ALT: 51 U/L (ref 0–53)
AST: 36 U/L (ref 0–37)
Albumin: 4.3 g/dL (ref 3.5–5.2)
Alkaline Phosphatase: 78 U/L (ref 39–117)
BUN: 17 mg/dL (ref 6–23)
CO2: 24 mEq/L (ref 19–32)
Calcium: 9.9 mg/dL (ref 8.4–10.5)
Chloride: 102 mEq/L (ref 96–112)
Creatinine, Ser: 0.87 mg/dL (ref 0.40–1.50)
GFR: 88.12 mL/min (ref >60.00)
Glucose, Bld: 143 mg/dL — ABNORMAL HIGH (ref 70–99)
Potassium: 3.8 mEq/L (ref 3.5–5.1)
Sodium: 137 mEq/L (ref 135–145)
Total Bilirubin: 0.7 mg/dL (ref 0.2–1.2)
Total Protein: 6.9 g/dL (ref 6.0–8.3)

## 2022-07-03 LAB — LIPID PANEL
Cholesterol: 152 mg/dL (ref 0–200)
HDL: 25.4 mg/dL — ABNORMAL LOW (ref >39.00)
NonHDL: 126.45
Total CHOL/HDL Ratio: 6
Triglycerides: 351 mg/dL — ABNORMAL HIGH (ref 0.0–149.0)
VLDL: 70.2 mg/dL — ABNORMAL HIGH (ref 0.0–40.0)

## 2022-07-03 LAB — LDL CHOLESTEROL, DIRECT: Direct LDL: 114 mg/dL

## 2022-07-03 MED ORDER — TRAZODONE HCL 100 MG PO TABS: ORAL_TABLET | ORAL | 3 refills | Status: DC

## 2022-07-03 MED ORDER — LOSARTAN POTASSIUM 100 MG PO TABS: 100.0000 mg | ORAL_TABLET | Freq: Every day | ORAL | 3 refills | Status: DC

## 2022-07-03 MED ORDER — AMLODIPINE BESYLATE 5 MG PO TABS: 5.0000 mg | ORAL_TABLET | Freq: Every day | ORAL | 3 refills | Status: DC

## 2022-07-03 NOTE — Progress Notes (Unsigned)
Established Patient Office Visit  Subjective   Patient ID: Derrick Brown, male    DOB: 04-06-1951  Age: 71 y.o. MRN: [id number]  Chief Complaint  Patient presents with   Establish Care    Pt is here for transition of care visit. He reports he is doing well, no new symptoms or issues to report.   HTN -- BP in office performed and is well controlled. She reports no side effects to the medications, no chest pain, SOB, dizziness or headaches. She has a BP cuff at home and is checking her BP regularly, reports they are in the normal range.    Diabetes - on farxiga, metformin and rybelsus, he reports he is tolerating these medications well, denies blurry vision or neuropathic symptoms. I reviewed his HM, he is UTD on his eye exam and he needs his foot exam today, also needs his urine and GFR testing. A1C in March 2023 was 6.0.   Insomnia-- pt needs refills on his trazodone therapy. States this medication is working well for him, is sleeping well at night, no side effects notes today.    Current Outpatient Medications  Medication Instructions   amLODipine (NORVASC) 5 mg, Oral, Daily   aspirin 81 mg, Oral, Daily,     atorvastatin (LIPITOR) 20 MG tablet TAKE 1 TABLET BY MOUTH EVERY OTHER DAY   betamethasone dipropionate 0.05 % cream APPLY TO AFFECTED AREA TWICE A DAY   dapagliflozin propanediol (FARXIGA) 10 MG TABS tablet TAKE 1 TABLET BY MOUTH EVERY DAY BEFORE BREAKFAST   fenofibrate (TRICOR) 48 MG tablet TAKE 1 TABLET BY MOUTH EVERY DAY   Fish Oil 2,000 mg, Oral, 2 times daily,     glipiZIDE (GLUCOTROL) 5 MG tablet TAKE 1 TABLET BY MOUTH EVERY DAY BEFORE SUPPER   losartan (COZAAR) 100 mg, Oral, Daily   metFORMIN (GLUCOPHAGE-XR) 750 mg, Oral, 2 times daily   Multiple Vitamin (MULTIVITAMIN) tablet 1 tablet, Oral, Daily,     OVER THE COUNTER MEDICATION Vitamin C 500 mg, one tablet daily.    OVER THE COUNTER MEDICATION Cinnamon 1000 mg one tablet daily.    OVER THE COUNTER MEDICATION CQ10 one  tablet daily.    pantoprazole (PROTONIX) 40 MG tablet TAKE 1 TABLET BY MOUTH EVERY DAY   RYBELSUS 7 MG TABS TAKE 1 TABLET BY MOUTH EVERY MORNING BEFORE BREAKFAST   Saw Palmetto 450 MG CAPS Oral, Daily   traZODone (DESYREL) 100 MG tablet TAKE 1 TABLET BY MOUTH EVERYDAY AT BEDTIME   triamcinolone cream (KENALOG) 0.1 % Topical, 2 times daily PRN    Patient Active Problem List   Diagnosis Date Noted   Class 1 obesity 11/28/2019   Cardiac murmur 07/04/2018   Overweight (BMI 25.0-29.9) 05/26/2017   Dysplastic nevus of left lower extremity 08/06/2014   Tinnitus of left ear 10/24/2013   Hearing loss in left ear 10/24/2013   Personal history of colonic adenoma 11/03/2012   Type 2 diabetes mellitus with hyperglycemia, without long-term current use of insulin ([place]) 07/07/2012   Actinic keratosis 04/15/2011   Low testosterone 04/10/2011   ED (erectile dysfunction) of organic origin 03/26/2011   OBSTRUCTIVE SLEEP APNEA 04/02/2010   INSOMNIA 04/02/2010   TEMPOROMANDIBULAR JOINT DISORDER 06/10/2009   DYSPHAGIA 05/15/2009   ESOPHAGITIS, REFLUX 05/13/2009   COUGH DUE TO ACE INHIBITORS 04/12/2009   Hyperlipidemia 02/21/2009   ACNE ROSACEA 02/21/2009   Essential hypertension 02/21/2009      Review of Systems  All other systems reviewed and are negative.  Objective:     BP 116/60 (BP Location: Left Arm, Patient Position: Sitting, Cuff Size: Large)   Pulse 82   Temp 97.7 F (36.5 C) (Oral)   Ht 6' 0.5" (1.842 m)   Wt 233 lb 4.8 oz (105.8 kg)   SpO2 98%   BMI 31.21 kg/m  BP Readings from Last 3 Encounters:  07/03/22 116/60  01/09/22 138/78  09/04/21 120/62      Physical Exam Vitals reviewed.  Constitutional:      Appearance: Normal appearance. He is well-groomed and normal weight.  HENT:     Head: Normocephalic and atraumatic.  Eyes:     Extraocular Movements: Extraocular movements intact.     Conjunctiva/sclera: Conjunctivae normal.  Cardiovascular:     Rate and  Rhythm: Normal rate and regular rhythm.     Pulses: Normal pulses.          Dorsalis pedis pulses are 2+ on the right side and 2+ on the left side.       Posterior tibial pulses are 2+ on the right side and 2+ on the left side.     Heart sounds: S1 normal and S2 normal. No murmur heard. Pulmonary:     Effort: Pulmonary effort is normal.     Breath sounds: Normal breath sounds and air entry. No rales.  Abdominal:     General: Bowel sounds are normal.     Tenderness: There is no abdominal tenderness.  Musculoskeletal:     Right lower leg: No edema.     Left lower leg: No edema.  Feet:     Right foot:     Protective Sensation: 3 sites tested.  3 sites sensed.     Skin integrity: Skin integrity normal.     Left foot:     Protective Sensation: 3 sites tested.  3 sites sensed.     Skin integrity: Skin integrity normal.  Neurological:     General: No focal deficit present.     Mental Status: He is alert and oriented to person, place, and time.     Gait: Gait is intact.  Psychiatric:        Mood and Affect: Mood and affect normal.     Last hemoglobin A1c Lab Results  Component Value Date   HGBA1C 6.0 (A) 01/09/2022      The 10-year ASCVD risk score (Arnett DK, et al., 2019) is: 34.4%    Assessment & Plan:   Problem List Items Addressed This Visit       Cardiovascular and Mediastinum   Essential hypertension - Primary    Current hypertension medications:       Sig   amLODipine (NORVASC) 5 MG tablet Take 1 tablet (5 mg total) by mouth daily.   losartan (COZAAR) 100 MG tablet Take 1 tablet (100 mg total) by mouth daily.  Doing well on the above regimen, BP is well controlled today, continue above medications as prescribed. He is due for his CMP and lipid panel today.      Relevant Medications   amLODipine (NORVASC) 5 MG tablet   losartan (COZAAR) 100 MG tablet   Other Relevant Orders   CMP (Completed)   Lipid Panel (Completed)     Endocrine   Type 2 diabetes  mellitus with hyperglycemia, without long-term current use of insulin (HCC)    A1C is well controlled on his current medications, farxiga 10 mg daily, metformin 750 mg BID, glucotrol 5 mg daily, and Rybelsus 7 mg daily. He  sees Endocrine also for DM management. Continue statin therapy due to elevated 10 year CVD risk. We performed his foot exam in the office and I ordered his urine microalbumin test for yearly surveillance.      Relevant Medications   losartan (COZAAR) 100 MG tablet   Other Relevant Orders   Microalbumin/Creatinine Ratio, Urine (Completed)     Other   INSOMNIA   Relevant Medications   traZODone (DESYREL) 100 MG tablet  Doing well on the 100 mg nightly of trazodone, will continue this medication for him. Other Visit Diagnoses     Need for immunization against influenza       Relevant Orders   Flu Vaccine QUAD High Dose(Fluad) (Completed)       Return in about 1 year (around 07/04/2023) for Annual physical exam.    Farrel Conners, MD

## 2022-07-09 NOTE — Assessment & Plan Note (Signed)
A1C is well controlled on his current medications, farxiga 10 mg daily, metformin 750 mg BID, glucotrol 5 mg daily, and Rybelsus 7 mg daily. Continue statin therapy due to elevated 10 year CVD risk

## 2022-07-09 NOTE — Assessment & Plan Note (Addendum)
Current hypertension medications:      Sig   amLODipine (NORVASC) 5 MG tablet Take 1 tablet (5 mg total) by mouth daily.   losartan (COZAAR) 100 MG tablet Take 1 tablet (100 mg total) by mouth daily.     Doing well on the above regimen, BP is well controlled today, continue above medications as prescribed. He is due for his CMP and lipid panel today.

## 2022-07-14 ENCOUNTER — Other Ambulatory Visit: Payer: Self-pay | Admitting: *Deleted

## 2022-07-14 DIAGNOSIS — E7801 Familial hypercholesterolemia: Secondary | ICD-10-CM

## 2022-07-14 MED ORDER — ATORVASTATIN CALCIUM 20 MG PO TABS: 20.0000 mg | ORAL_TABLET | ORAL | 1 refills | Status: DC

## 2022-07-15 ENCOUNTER — Encounter: Payer: Self-pay | Admitting: Internal Medicine

## 2022-07-15 ENCOUNTER — Ambulatory Visit: Payer: No Typology Code available for payment source | Admitting: Internal Medicine

## 2022-07-15 VITALS — BP 128/74 | HR 77 | Ht 72.5 in | Wt 235.6 lb

## 2022-07-15 DIAGNOSIS — E785 Hyperlipidemia, unspecified: Secondary | ICD-10-CM

## 2022-07-15 DIAGNOSIS — E1165 Type 2 diabetes mellitus with hyperglycemia: Secondary | ICD-10-CM

## 2022-07-15 DIAGNOSIS — E669 Obesity, unspecified: Secondary | ICD-10-CM

## 2022-07-15 DIAGNOSIS — E66811 Obesity, class 1: Secondary | ICD-10-CM

## 2022-07-15 LAB — POCT GLYCOSYLATED HEMOGLOBIN (HGB A1C): Hemoglobin A1C: 6.1 % — AB (ref 4.0–5.6)

## 2022-07-15 NOTE — Patient Instructions (Signed)
Please continue: - Metformin ER 1500 mg at night - Glipizide 2.5 mg as needed before a larger meal - Rybelsus 7 mg before b'fast - Farxiga 10 mg before b'fast  Please return in 6 months with your sugar log.

## 2022-07-15 NOTE — Progress Notes (Signed)
Patient ID: Derrick Brown, male   DOB: [birth year], 71 y.o.   MRN: [id number]   HPI: Derrick Brown is a 71 y.o.-year-old male, returning for follow-up for DM2, dx in ~2007, non-insulin-dependent, now controlled, without long term complications. Last visit 6 months ago.  Interim history: No increased urination, blurry vision, nausea, chest pain.  Reviewed HbA1c levels: Lab Results  Component Value Date   HGBA1C 6.0 (A) 01/09/2022   HGBA1C 6.2 (A) 09/04/2021   HGBA1C 6.3 (A) 04/18/2021   Pt is on a regimen of: - Metformin ER 750 mg twice a day >> 1500 (750 x2) mg at dinnertime - Rybelsus 7 mg before breakfast - Farxiga 10 mg before breakfast - Glipizide 5 mg as needed before a larger meal >> 2.5 mg QOD >> with larger meals only - 2-3x a week In 02/2019 we tried Rybelsus but we had to stop due to GI side effects and sugars were also more fluctuating.  We restarted Januvia afterwards. Previously on Invokana, then [place]. Previously on Januvia.  Pt checks his sugars twice a day per review of his log: - am: 95-139, 147, 156 >> 86-131, 137 >> 72, 103-143, 156 - 2h after b'fast: n/c >> 198, 200 >> n/c - before lunch: n/c - 2h after lunch: n/c - before dinner: n/c - 2h after dinner: n/c - bedtime: 79-169 >> 63, 70, 91-154 >> 61, 66-148, 155, 165 - nighttime: n/c Lowest sugar was 93 >> 87 >> 77 >> 79 >> 63 >> 61. Highest sugar was 228 >> 203 >> 189 >> 171 >> 154 >> 165.  Glucometer: Freestyle Lite  Pt's meals are: - Breakfast: oatmeal + blueberries + pecan; cereals; fruit juice; coffee - Lunch: chicken salad or sandwich - late - Dinner: cereal or light dinner - late >> eating out more - Snacks: takes snack boxes to work: yoghurt, veggies, grapes/prunes, chips/cheetos No formal exercise.  No CKD, last BUN/creatinine:  Lab Results  Component Value Date   BUN 17 07/03/2022   BUN 13 07/02/2021   CREATININE 0.87 07/03/2022   CREATININE 0.83 07/02/2021  On losartan.  No MAU: Lab  Results  Component Value Date   MICRALBCREAT 3.2 07/03/2022   MICRALBCREAT 1.3 07/02/2021   MICRALBCREAT 1.3 02/23/2020   MICRALBCREAT 1.3 11/30/2018   MICRALBCREAT 1.4 07/04/2018   MICRALBCREAT 1.2 06/30/2017   MICRALBCREAT 1.5 07/28/2016   MICRALBCREAT 1.0 10/18/2014   MICRALBCREAT 1.1 04/23/2014   MICRALBCREAT 0.8 09/08/2013   + HL; last set of lipids: Lab Results  Component Value Date   CHOL 152 07/03/2022   CHOL 145 07/02/2021   CHOL 162 07/15/2020   Lab Results  Component Value Date   HDL 25.40 (L) 07/03/2022   HDL 27.80 (L) 07/02/2021   HDL 25 (L) 07/15/2020   Lab Results  Component Value Date   [facility]  07/15/2020     Comment:     . LDL cholesterol not calculated. Triglyceride levels greater than 400 mg/dL invalidate calculated LDL results. . Reference range: <100 . Desirable range <100 mg/dL for primary prevention;   <70 mg/dL for patients with CHD or diabetic patients  with > or = 2 CHD risk factors. Marland Kitchen LDL-C is now calculated using the Martin-Hopkins  calculation, which is a validated novel method providing  better accuracy than the Friedewald equation in the  estimation of LDL-C.  Cresenciano Genre et al. Annamaria Helling. 2703;500(93): 2061-2068  (http://education.[facility].com/faq/FAQ164)    LDLCALC 74 07/04/2018   LDLCALC 75 09/08/2013   Lab  Results  Component Value Date   TRIG 351.0 (H) 07/03/2022   TRIG 299.0 (H) 07/02/2021   TRIG 468 (H) 07/15/2020   Lab Results  Component Value Date   CHOLHDL 6 07/03/2022   CHOLHDL 5 07/02/2021   CHOLHDL 6.5 (H) 07/15/2020   Lab Results  Component Value Date   LDLDIRECT 114.0 07/03/2022   LDLDIRECT 89.0 07/02/2021   LDLDIRECT 83.0 02/23/2020  On Lipitor 20 every other day and fish oil 2000 mg twice a day. Fenofibrate 48 mg daily.  Vascepa was not covered by his insurance.  - last eye exam was: 03/2022:No DR, +  cataracts. Dr. Gershon Crane.  - no numbness and tingling in his feet.  Foot exam 06/2022.  She also  has HTN, GERD.  ROS: + see HPI  I reviewed pt's medications, allergies, PMH, social hx, family hx, and changes were documented in the history of present illness. Otherwise, unchanged from my initial visit note.  Past Medical History:  Diagnosis Date   ACNE ROSACEA 02/21/2009   Allergy    COUGH DUE TO ACE INHIBITORS 04/12/2009   Diabetes mellitus without complication ([place])    type 2   DYSPHAGIA 05/15/2009   ESOPHAGITIS, REFLUX 05/13/2009   FATIGUE 03/14/2010   Fatty liver    GERD (gastroesophageal reflux disease)    HYPERGLYCEMIA 02/21/2009   HYPERLIPIDEMIA 02/21/2009   HYPERTENSION NEC 02/21/2009   INSOMNIA 04/02/2010   MUSCLE PAIN 05/13/2009   OBSTRUCTIVE SLEEP APNEA 04/02/2010   RENAL CALCULUS 02/21/2009   Sleep apnea    TEMPOROMANDIBULAR JOINT DISORDER 06/10/2009   Unspecified hypertrophic and atrophic condition of skin    Past Surgical History:  Procedure Laterality Date   COLONOSCOPY     ESOPHAGOGASTRODUODENOSCOPY     KIDNEY STONE SURGERY     removal   TONSILLECTOMY AND ADENOIDECTOMY     Social History   Social History   Marital status: Married    Spouse name: N/A   Social History Main Topics   Smoking status: Never Smoker   Smokeless tobacco: Never Used   Alcohol use 1.7 oz/week    1 burbon   Drug use: No   Current Outpatient Medications on File Prior to Visit  Medication Sig Dispense Refill   amLODipine (NORVASC) 5 MG tablet Take 1 tablet (5 mg total) by mouth daily. 90 tablet 3   aspirin 81 MG tablet Take 81 mg by mouth daily.     atorvastatin (LIPITOR) 20 MG tablet Take 1 tablet (20 mg total) by mouth every other day. 45 tablet 1   betamethasone dipropionate 0.05 % cream APPLY TO AFFECTED AREA TWICE A DAY 30 g 1   dapagliflozin propanediol (FARXIGA) 10 MG TABS tablet TAKE 1 TABLET BY MOUTH EVERY DAY BEFORE BREAKFAST 90 tablet 3   fenofibrate (TRICOR) 48 MG tablet TAKE 1 TABLET BY MOUTH EVERY DAY 90 tablet 1   glipiZIDE (GLUCOTROL) 5 MG tablet TAKE 1 TABLET BY MOUTH  EVERY DAY BEFORE SUPPER 90 tablet 3   losartan (COZAAR) 100 MG tablet Take 1 tablet (100 mg total) by mouth daily. 90 tablet 3   metFORMIN (GLUCOPHAGE-XR) 750 MG 24 hr tablet Take 1 tablet (750 mg total) by mouth 2 (two) times daily. 180 tablet 3   Multiple Vitamin (MULTIVITAMIN) tablet Take 1 tablet by mouth daily.     Omega-3 Fatty Acids (FISH OIL) 1000 MG CAPS Take 2,000 mg by mouth 2 (two) times daily.       OVER THE COUNTER MEDICATION Vitamin C 500  mg, one tablet daily.     OVER THE COUNTER MEDICATION Cinnamon 1000 mg one tablet daily.     OVER THE COUNTER MEDICATION CQ10 one tablet daily.     pantoprazole (PROTONIX) 40 MG tablet TAKE 1 TABLET BY MOUTH EVERY DAY 90 tablet 0   RYBELSUS 7 MG TABS TAKE 1 TABLET BY MOUTH EVERY MORNING BEFORE BREAKFAST 90 tablet 3   Saw Palmetto 450 MG CAPS Take by mouth daily.     traZODone (DESYREL) 100 MG tablet TAKE 1 TABLET BY MOUTH EVERYDAY AT BEDTIME 90 tablet 3   triamcinolone cream (KENALOG) 0.1 % Apply topically 2 (two) times daily as needed. 30 g 0   No current facility-administered medications on file prior to visit.   Allergies  Allergen Reactions   Hctz [Hydrochlorothiazide] Hives, Itching and Rash   Family History  Problem Relation Age of Onset   Stroke Mother 65   Rectal cancer Sister    Heart attack Father 64   Healthy Brother    Colon polyps Sister    Heart disease Other        family histrory   Diabetes Paternal Grandmother    Colon cancer Neg Hx    Esophageal cancer Neg Hx    Liver cancer Neg Hx    Pancreatic cancer Neg Hx    Stomach cancer Neg Hx    PE: BP 128/74 (BP Location: Right Arm, Patient Position: Sitting, Cuff Size: Normal)   Pulse 77   Ht 6' 0.5" (1.842 m)   Wt 235 lb 9.6 oz (106.9 kg)   SpO2 96%   BMI 31.51 kg/m  Wt Readings from Last 3 Encounters:  07/15/22 235 lb 9.6 oz (106.9 kg)  07/03/22 233 lb 4.8 oz (105.8 kg)  01/09/22 232 lb 9.6 oz (105.5 kg)   Constitutional: overweight, in NAD Eyes:  EOMI,  no exophthalmos ENT: no neck masses, no cervical lymphadenopathy Cardiovascular: RRR, No MRG Respiratory: CTA B Musculoskeletal: no deformities Skin:no rashes Neurological: no tremor with outstretched hands  ASSESSMENT: 1. DM2, non-insulin-dependent, uncontrolled, without long term complications, but with hyperglycemia  2. Obesity class 1  3. HL  PLAN:  1. Patient with longstanding, previously uncontrolled type 2 diabetes, with improved control after starting an SGLT2 inhibitor.  At last visit, the majority of his blood sugars were at goal.  He occasionally had a higher blood sugar in the morning and also an occasional blood sugar in the 60s and 70s later in the day but otherwise sugars were at target.  We did not change his regimen.  I did advise him to use the 2.5 mg of glipizide before larger dinners only. -At last visit, HbA1c was 6.0%, improved -At today's visit, sugars are mostly at goal with very few hyperglycemic exceptions in the morning.  He had only 2 blood sugars in the 60s, but the rest of the blood sugars are at goal.  For now, we can continue the current regimen. - I suggested to:  Patient Instructions  Please continue: - Metformin ER 1500 mg at night - Glipizide 2.5 mg as needed before a larger meal - Rybelsus 7 mg before b'fast - Farxiga 10 mg before b'fast  Please return in 6 months with your sugar log.   - we checked his HbA1c: 6.1% (slightly higher, but still excellent) - advised to check sugars at different times of the day - 1x a day, rotating check times - advised for yearly eye exams >> he is UTD - return to  clinic in 6 months   2. Obesity class 1 -continue SGLT 2 inhibitor and GLP-1 receptor agonist which should also help with weight loss -She was previously not able to tolerate Rybelsus due to GI symptoms, but we restarted this and he now tolerates it well.  Retrospectively, previous GI symptoms could have been related to his COVID-19 infection. -He  gained 3 pounds before last visit and 3 lbs since then  3. HL -Reviewed his latest lipid panel: All fractions are abnormal: Lab Results  Component Value Date   CHOL 152 07/03/2022   HDL 25.40 (L) 07/03/2022   [place]  07/15/2020     Comment:     . LDL cholesterol not calculated. Triglyceride levels greater than 400 mg/dL invalidate calculated LDL results. . Reference range: <100 . Desirable range <100 mg/dL for primary prevention;   <70 mg/dL for patients with CHD or diabetic patients  with > or = 2 CHD risk factors. Marland Kitchen LDL-C is now calculated using the Martin-Hopkins  calculation, which is a validated novel method providing  better accuracy than the Friedewald equation in the  estimation of LDL-C.  Cresenciano Genre et al. Annamaria Helling. 2458;099(83): 2061-2068  (http://education.[facility].com/faq/FAQ164)    LDLDIRECT 114.0 07/03/2022   TRIG 351.0 (H) 07/03/2022   CHOLHDL 6 07/03/2022  -He continues on Lipitor 20 mg every other day, fish oil 2000 mg twice a day and fenofibrate 48 mg daily.  No side effects. -PCP advised him to start exercise to hopefully improve his lipid panel  Philemon Kingdom, MD PhD [facility] Endocrinology

## 2022-07-17 ENCOUNTER — Encounter: Payer: Self-pay | Admitting: Family Medicine

## 2022-07-23 ENCOUNTER — Encounter: Payer: No Typology Code available for payment source | Admitting: Family Medicine

## 2022-08-06 ENCOUNTER — Other Ambulatory Visit: Payer: Self-pay | Admitting: *Deleted

## 2022-08-06 MED ORDER — FENOFIBRATE 48 MG PO TABS: 48.0000 mg | ORAL_TABLET | Freq: Every day | ORAL | 1 refills | Status: DC

## 2022-10-05 ENCOUNTER — Other Ambulatory Visit: Payer: Self-pay | Admitting: Internal Medicine

## 2022-10-07 ENCOUNTER — Other Ambulatory Visit: Payer: Self-pay | Admitting: Internal Medicine

## 2022-11-26 ENCOUNTER — Encounter: Payer: Self-pay | Admitting: Internal Medicine

## 2022-11-26 ENCOUNTER — Ambulatory Visit: Payer: Commercial Managed Care - PPO | Admitting: Internal Medicine

## 2022-11-26 VITALS — BP 128/64 | HR 103 | Ht 72.5 in | Wt 234.0 lb

## 2022-11-26 DIAGNOSIS — E785 Hyperlipidemia, unspecified: Secondary | ICD-10-CM | POA: Diagnosis not present

## 2022-11-26 DIAGNOSIS — E1165 Type 2 diabetes mellitus with hyperglycemia: Secondary | ICD-10-CM | POA: Diagnosis not present

## 2022-11-26 DIAGNOSIS — E669 Obesity, unspecified: Secondary | ICD-10-CM | POA: Diagnosis not present

## 2022-11-26 LAB — POCT GLYCOSYLATED HEMOGLOBIN (HGB A1C): Hemoglobin A1C: 6.3 % — AB (ref 4.0–5.6)

## 2022-11-26 MED ORDER — METFORMIN HCL ER 750 MG PO TB24: 750.0000 mg | ORAL_TABLET | Freq: Two times a day (BID) | ORAL | 3 refills | Status: DC

## 2022-11-26 MED ORDER — DAPAGLIFLOZIN PROPANEDIOL 10 MG PO TABS: ORAL_TABLET | ORAL | 3 refills | Status: DC

## 2022-11-26 MED ORDER — FENOFIBRATE 145 MG PO TABS: 145.0000 mg | ORAL_TABLET | Freq: Every day | ORAL | 3 refills | Status: DC

## 2022-11-26 NOTE — Progress Notes (Signed)
Patient ID: Derrick Brown, male   DOB: 07/11/1951, 72 y.o.   MRN: [id number]   HPI: Derrick Brown is a 72 y.o.-year-old male, returning for follow-up for DM2, dx in ~2007, non-insulin-dependent, now controlled, without long term complications. Last visit 5 months ago.  Interim history: No increased urination, blurry vision, nausea, chest pain.  Reviewed HbA1c levels: Lab Results  Component Value Date   HGBA1C 6.1 (A) 07/15/2022   HGBA1C 6.0 (A) 01/09/2022   HGBA1C 6.2 (A) 09/04/2021   Pt is on a regimen of: - Metformin ER 750 mg twice a day >> 1500 (750 x2) mg at dinnertime - Rybelsus 7 mg before breakfast - Farxiga 10 mg before breakfast - Glipizide 5 mg as needed before a larger meal >> 2.5 mg QOD >> with larger meals only - 2-3x a week In 02/2019 we tried Rybelsus but we had to stop due to GI side effects and sugars were also more fluctuating.  We restarted Januvia afterwards. Previously on Invokana, then [place]. Previously on Januvia.  Pt checks his sugars twice a day per review of his log: - am: 86-131, 137 >> 72, 103-143, 156 >> 88, 104-139, 146, 164 - 2h after b'fast: n/c >> 198, 200 >> n/c - before lunch: n/c - 2h after lunch: n/c - before dinner: n/c - 2h after dinner: n/c - bedtime: 63, 70, 91-154 >> 61, 66-148, 155, 165 >> 86, 97-154, 168, 176 - nighttime: n/c Lowest sugar was 93 >> 87 >> 77 >> 79 >> 63 >> 61 >> 88. Highest sugar was 228 >> 203 >> 189 >> 171 >> 154 >> 165 >> 176.  Glucometer: Freestyle Lite  Pt's meals are: - Breakfast: oatmeal + blueberries + pecan; cereals; fruit juice; coffee - Lunch: chicken salad or sandwich - late - Dinner: cereal or light dinner - late >> eating out more - Snacks: takes snack boxes to work: yoghurt, veggies, grapes/prunes, chips/cheetos No formal exercise.  No CKD, last BUN/creatinine:  Lab Results  Component Value Date   BUN 17 07/03/2022   BUN 13 07/02/2021   CREATININE 0.87 07/03/2022   CREATININE 0.83 07/02/2021   On losartan.  No MAU: Lab Results  Component Value Date   MICRALBCREAT 3.2 07/03/2022   MICRALBCREAT 1.3 07/02/2021   MICRALBCREAT 1.3 02/23/2020   MICRALBCREAT 1.3 11/30/2018   MICRALBCREAT 1.4 07/04/2018   MICRALBCREAT 1.2 06/30/2017   MICRALBCREAT 1.5 07/28/2016   MICRALBCREAT 1.0 10/18/2014   MICRALBCREAT 1.1 04/23/2014   MICRALBCREAT 0.8 09/08/2013   + HL; last set of lipids: Lab Results  Component Value Date   CHOL 152 07/03/2022   CHOL 145 07/02/2021   CHOL 162 07/15/2020   Lab Results  Component Value Date   HDL 25.40 (L) 07/03/2022   HDL 27.80 (L) 07/02/2021   HDL 25 (L) 07/15/2020   Lab Results  Component Value Date   [facility]  07/15/2020     Comment:     . LDL cholesterol not calculated. Triglyceride levels greater than 400 mg/dL invalidate calculated LDL results. . Reference range: <100 . Desirable range <100 mg/dL for primary prevention;   <70 mg/dL for patients with CHD or diabetic patients  with > or = 2 CHD risk factors. Marland Kitchen LDL-C is now calculated using the Martin-Hopkins  calculation, which is a validated novel method providing  better accuracy than the Friedewald equation in the  estimation of LDL-C.  Cresenciano Genre et al. Annamaria Helling. 3646;803(21): 2061-2068  (http://education.[facility].com/faq/FAQ164)    LDLCALC 74 07/04/2018  [place] 70 09/08/2013   Lab Results  Component Value Date   TRIG 351.0 (H) 07/03/2022   TRIG 299.0 (H) 07/02/2021   TRIG 468 (H) 07/15/2020   Lab Results  Component Value Date   CHOLHDL 6 07/03/2022   CHOLHDL 5 07/02/2021   CHOLHDL 6.5 (H) 07/15/2020   Lab Results  Component Value Date   LDLDIRECT 114.0 07/03/2022   LDLDIRECT 89.0 07/02/2021   LDLDIRECT 83.0 02/23/2020  On Lipitor 20 every other day and fish oil 2000 mg twice a day. Fenofibrate 48 mg daily.  Vascepa was not covered by his insurance.  - last eye exam was: 03/2022:No DR, +  cataracts. Dr. Gershon Brown.  - no numbness and tingling in his feet.   Foot exam 06/2022.  72 also has HTN, GERD.  ROS: + see HPI  I reviewed pt's medications, allergies, PMH, social hx, family hx, and changes were documented in the history of present illness. Otherwise, unchanged from my initial visit note.  Past Medical History:  Diagnosis Date   ACNE ROSACEA 02/21/2009   Allergy    COUGH DUE TO ACE INHIBITORS 04/12/2009   Diabetes mellitus without complication ([place])    type 2   DYSPHAGIA 05/15/2009   ESOPHAGITIS, REFLUX 05/13/2009   FATIGUE 03/14/2010   Fatty liver    GERD (gastroesophageal reflux disease)    HYPERGLYCEMIA 02/21/2009   HYPERLIPIDEMIA 02/21/2009   HYPERTENSION NEC 02/21/2009   INSOMNIA 04/02/2010   MUSCLE PAIN 05/13/2009   OBSTRUCTIVE SLEEP APNEA 04/02/2010   RENAL CALCULUS 02/21/2009   Sleep apnea    TEMPOROMANDIBULAR JOINT DISORDER 06/10/2009   Unspecified hypertrophic and atrophic condition of skin    Past Surgical History:  Procedure Laterality Date   COLONOSCOPY     ESOPHAGOGASTRODUODENOSCOPY     KIDNEY STONE SURGERY     removal   TONSILLECTOMY AND ADENOIDECTOMY     Social History   Social History   Marital status: Married    Spouse name: N/A   Social History Main Topics   Smoking status: Never Smoker   Smokeless tobacco: Never Used   Alcohol use 1.7 oz/week    1 burbon   Drug use: No   Current Outpatient Medications on File Prior to Visit  Medication Sig Dispense Refill   amLODipine (NORVASC) 5 MG tablet Take 1 tablet (5 mg total) by mouth daily. 90 tablet 3   aspirin 81 MG tablet Take 81 mg by mouth daily.     atorvastatin (LIPITOR) 20 MG tablet Take 1 tablet (20 mg total) by mouth every other day. 45 tablet 1   betamethasone dipropionate 0.05 % cream APPLY TO AFFECTED AREA TWICE A DAY 30 g 1   dapagliflozin propanediol (FARXIGA) 10 MG TABS tablet TAKE 1 TABLET BY MOUTH EVERY DAY BEFORE BREAKFAST 90 tablet 3   fenofibrate (TRICOR) 48 MG tablet Take 1 tablet (48 mg total) by mouth daily. 90 tablet 1   glipiZIDE  (GLUCOTROL) 5 MG tablet Take 2.5 mg as needed before a larger meal 90 tablet 1   losartan (COZAAR) 100 MG tablet Take 1 tablet (100 mg total) by mouth daily. 90 tablet 3   metFORMIN (GLUCOPHAGE-XR) 750 MG 24 hr tablet Take 1 tablet (750 mg total) by mouth 2 (two) times daily. 180 tablet 3   Multiple Vitamin (MULTIVITAMIN) tablet Take 1 tablet by mouth daily.     Omega-3 Fatty Acids (FISH OIL) 1000 MG CAPS Take 2,000 mg by mouth 2 (two) times daily.  OVER THE COUNTER MEDICATION Vitamin C 500 mg, one tablet daily.     OVER THE COUNTER MEDICATION Cinnamon 1000 mg one tablet daily.     OVER THE COUNTER MEDICATION CQ10 one tablet daily.     pantoprazole (PROTONIX) 40 MG tablet TAKE 1 TABLET BY MOUTH EVERY DAY 90 tablet 0   RYBELSUS 7 MG TABS TAKE 1 TABLET BY MOUTH EVERY DAY BEFORE BREAKFAST 90 tablet 3   Saw Palmetto 450 MG CAPS Take by mouth daily.     traZODone (DESYREL) 100 MG tablet TAKE 1 TABLET BY MOUTH EVERYDAY AT BEDTIME 90 tablet 3   triamcinolone cream (KENALOG) 0.1 % Apply topically 2 (two) times daily as needed. 30 g 0   No current facility-administered medications on file prior to visit.   Allergies  Allergen Reactions   Hctz [Hydrochlorothiazide] Hives, Itching and Rash   Family History  Problem Relation Age of Onset   Stroke Mother 76   Rectal cancer Sister    Heart attack Father 72   Healthy Brother    Colon polyps Sister    Heart disease Other        family histrory   Diabetes Paternal Grandmother    Colon cancer Neg Hx    Esophageal cancer Neg Hx    Liver cancer Neg Hx    Pancreatic cancer Neg Hx    Stomach cancer Neg Hx    PE: BP 128/64 (BP Location: Left Arm, Patient Position: Sitting, Cuff Size: Normal)   Pulse (!) 103   Ht 6' 0.5" (1.842 m)   Wt 234 lb (106.1 kg)   SpO2 99%   BMI 31.30 kg/m  Wt Readings from Last 3 Encounters:  11/26/22 234 lb (106.1 kg)  07/15/22 235 lb 9.6 oz (106.9 kg)  07/03/22 233 lb 4.8 oz (105.8 kg)   Constitutional:  overweight, in NAD Eyes:  EOMI, no exophthalmos ENT: no neck masses, no cervical lymphadenopathy Cardiovascular: Tachycardia, RR, No MRG Respiratory: CTA B Musculoskeletal: no deformities Skin:no rashes Neurological: no tremor with outstretched hands  ASSESSMENT: 1. DM2, non-insulin-dependent, uncontrolled, without long term complications, but with hyperglycemia  2. Obesity class 1  3. HL  PLAN:  1. Patient with longstanding, previously uncontrolled, type 2 diabetes, with most control after starting an SGLT2 inhibitor.  He continues on the regimen of metformin, sulfonylurea prn, p.o. GLP-1 receptor agonist and SGLT2 inhibitor.  At last visit, sugars were mostly at goal, with very few hyperglycemic exceptions in the morning and 2 blood sugars in the 60s.  We did not change his regimen.  HbA1c was slightly higher, at 6.1, but still excellent. -At today's visit, sugars are still mostly at goal, but he does have occasional hyperglycemic spikes in the morning in the 140s-160.  At night, sugars are usually at goal.  No lows.  He had an occasional blood sugar in the 80s, but this was not consistent.  No need to change his regimen for now.  However, he would be interested in trying to take the metformin earlier, before dinner and I advised him that he can definitely try to experiment with this. - I suggested to:  Patient Instructions  Please continue: - Metformin ER 1500 mg at night - Glipizide 2.5 mg as needed before a larger meal - Rybelsus 7 mg before b'fast - Farxiga 10 mg before b'fast  Increase: - Fenofibrate to 96 mg daily until you run out of the 48 mg tablets, then start 145 mg daily  Please return in  6 months with your sugar log.   - we checked his HbA1c: 6.3% slightly higher) - advised to check sugars at different times of the day - 1x a day, rotating check times - advised for yearly eye exams >> he is UTD - return to clinic in 6 months   2. Obesity class 1 -continue SGLT 2  inhibitor and GLP-1 receptor agonist which should also help with weight loss -She was previously not able to tolerate Rybelsus due to GI symptoms, but we restarted this and he now tolerates it well.  Retrospectively, previous GI symptoms could have been related to his COVID-19 infection. -He gained 6 pounds before the last 2 visits combined; he lost 1 pound since last  3. HL -Reviewed latest lipid panel from 06/2022: All fractions abnormal: Lab Results  Component Value Date   CHOL 152 07/03/2022   HDL 25.40 (L) 07/03/2022   [place]  07/15/2020     Comment:     . LDL cholesterol not calculated. Triglyceride levels greater than 400 mg/dL invalidate calculated LDL results. . Reference range: <100 . Desirable range <100 mg/dL for primary prevention;   <70 mg/dL for patients with CHD or diabetic patients  with > or = 2 CHD risk factors. Marland Kitchen LDL-C is now calculated using the Martin-Hopkins  calculation, which is a validated novel method providing  better accuracy than the Friedewald equation in the  estimation of LDL-C.  Cresenciano Genre et al. Annamaria Helling. 7035;009(38): 2061-2068  (http://education.[facility].com/faq/FAQ164)    LDLDIRECT 114.0 07/03/2022   TRIG 351.0 (H) 07/03/2022   CHOLHDL 6 07/03/2022  -He continues on Lipitor 20 mg every other day, fish oil 2000 mg twice a day and fenofibrate 48 mg daily.  No side effects. -Based on the above triglyceride levels, I advised him to increase the fenofibrate dose, initially to 96 mg daily (2 tablets), until he runs out, and then fill a prescription for 145 mg daily.  Plan to repeat the lipids at next visit.  Philemon Kingdom, MD PhD [facility] Endocrinology

## 2022-11-26 NOTE — Patient Instructions (Addendum)
Please continue: - Metformin ER 1500 mg at night - Glipizide 2.5 mg as needed before a larger meal - Rybelsus 7 mg before b'fast - Farxiga 10 mg before b'fast  Increase: - Fenofibrate to 96 mg daily until you run out of the 48 mg tablets, then start 145 mg daily  Please return in 6 months with your sugar log.

## 2023-01-02 ENCOUNTER — Other Ambulatory Visit: Payer: Self-pay | Admitting: Family Medicine

## 2023-01-02 DIAGNOSIS — E7801 Familial hypercholesterolemia: Secondary | ICD-10-CM

## 2023-01-06 ENCOUNTER — Ambulatory Visit: Payer: No Typology Code available for payment source | Admitting: Internal Medicine

## 2023-01-16 ENCOUNTER — Telehealth: Payer: Self-pay

## 2023-01-16 ENCOUNTER — Encounter: Payer: Self-pay | Admitting: Internal Medicine

## 2023-01-16 NOTE — Telephone Encounter (Signed)
Rybelsus needs a PA.  

## 2023-01-19 ENCOUNTER — Telehealth: Payer: Self-pay | Admitting: Pharmacy Technician

## 2023-01-19 ENCOUNTER — Other Ambulatory Visit (HOSPITAL_COMMUNITY): Payer: Self-pay

## 2023-01-19 NOTE — Telephone Encounter (Signed)
PA requested

## 2023-01-19 NOTE — Telephone Encounter (Signed)
Pharmacy Patient Advocate Encounter   Received notification from Pt advice req msgs/CMA that prior authorization for Rybelsus 7mg  is required/requested.   PA submitted on 01/19/23 to (ins) [employer] via CoverMyMeds [employer] or [facility]) confirmation #  [id number] Status is pending

## 2023-01-22 ENCOUNTER — Other Ambulatory Visit: Payer: Self-pay | Admitting: Internal Medicine

## 2023-01-22 DIAGNOSIS — E7801 Familial hypercholesterolemia: Secondary | ICD-10-CM

## 2023-01-25 ENCOUNTER — Other Ambulatory Visit (HOSPITAL_COMMUNITY): Payer: Self-pay

## 2023-01-25 NOTE — Telephone Encounter (Signed)
Pharmacy Patient Advocate Encounter  Prior Authorization for Rybelsus 7mg  has been approved by [employer] (ins).    PA # [id number] Effective dates: 01/19/23 through 01/19/24  Per test claim: filled 01/25/23 next eligible fill date 04/03/23

## 2023-03-12 ENCOUNTER — Encounter: Payer: Self-pay | Admitting: Family Medicine

## 2023-03-12 ENCOUNTER — Ambulatory Visit: Payer: Commercial Managed Care - PPO | Admitting: Family Medicine

## 2023-03-12 ENCOUNTER — Telehealth: Payer: Self-pay | Admitting: Internal Medicine

## 2023-03-12 VITALS — BP 130/58 | HR 70 | Temp 98.1°F | Ht 72.0 in | Wt 234.3 lb

## 2023-03-12 DIAGNOSIS — Z86010 Personal history of colonic polyps: Secondary | ICD-10-CM

## 2023-03-12 DIAGNOSIS — I340 Nonrheumatic mitral (valve) insufficiency: Secondary | ICD-10-CM

## 2023-03-12 DIAGNOSIS — Z1211 Encounter for screening for malignant neoplasm of colon: Secondary | ICD-10-CM

## 2023-03-12 NOTE — Assessment & Plan Note (Signed)
Reviewed ECHO from 2019, it was mild at the time. Will send patient back to cardiology to have it looked at and to see if any additional testing is needed. Pt denies any cardiac symptoms at this time.

## 2023-03-12 NOTE — Progress Notes (Signed)
Established Patient Office Visit  Subjective   Patient ID: Derrick Brown, male    DOB: 28-Dec-1950  Age: 72 y.o. MRN: [id number]  Chief Complaint  Patient presents with   Referral    Patient requests a referral to see Dr Leone Payor for follow up colonoscopy    Patient is here for 6 month follow up today. Pt is reporting he is having more trouble with his left hand, states there is a "bump" on his palm that he has noticed, it is not tender or painful, not red or enlarging. He also reports a history of trigger finger, states that the trigger finger on the right hand may be getting worse. No problems with grip strength. Has had injections in the past by Dr. Thurston Hole.  Patient also reports a history of mild mitral regurgitation, states that he has not been seen by cardiology in about 5 years. States that because he had risk factors he might need to go back to be re-evaluated. He denies any chest pain, no SOB no sweating or dyspnea.     Current Outpatient Medications  Medication Instructions   amLODipine (NORVASC) 5 mg, Oral, Daily   aspirin 81 mg, Oral, Daily,     atorvastatin (LIPITOR) 20 mg, Oral, Every other day   betamethasone dipropionate 0.05 % cream APPLY TO AFFECTED AREA TWICE A DAY   dapagliflozin propanediol (FARXIGA) 10 MG TABS tablet TAKE 1 TABLET BY MOUTH EVERY DAY BEFORE BREAKFAST   fenofibrate (TRICOR) 145 mg, Oral, Daily   Fish Oil 2,000 mg, Oral, 2 times daily,     glipiZIDE (GLUCOTROL) 5 MG tablet Take 2.5 mg as needed before a larger meal   losartan (COZAAR) 100 mg, Oral, Daily   metFORMIN (GLUCOPHAGE-XR) 750 mg, Oral, 2 times daily   Multiple Vitamin (MULTIVITAMIN) tablet 1 tablet, Oral, Daily,     OVER THE COUNTER MEDICATION Vitamin C 500 mg, one tablet daily.    OVER THE COUNTER MEDICATION Cinnamon 1000 mg one tablet daily.    OVER THE COUNTER MEDICATION CQ10 one tablet daily.    pantoprazole (PROTONIX) 40 MG tablet TAKE 1 TABLET BY MOUTH EVERY DAY   RYBELSUS 7 MG TABS  TAKE 1 TABLET BY MOUTH EVERY DAY BEFORE BREAKFAST   Saw Palmetto 450 MG CAPS Oral, Daily   traZODone (DESYREL) 100 MG tablet TAKE 1 TABLET BY MOUTH EVERYDAY AT BEDTIME   triamcinolone cream (KENALOG) 0.1 % Topical, 2 times daily PRN    Patient Active Problem List   Diagnosis Date Noted   Nonrheumatic mitral valve regurgitation 03/12/2023   Class 1 obesity 11/28/2019   Cardiac murmur 07/04/2018   Overweight (BMI 25.0-29.9) 05/26/2017   Dysplastic nevus of left lower extremity 08/06/2014   Tinnitus of left ear 10/24/2013   Hearing loss in left ear 10/24/2013   Personal history of colonic adenoma 11/03/2012   Type 2 diabetes mellitus with hyperglycemia, without long-term current use of insulin (HCC) 07/07/2012   Actinic keratosis 04/15/2011   Low testosterone 04/10/2011   ED (erectile dysfunction) of organic origin 03/26/2011   OBSTRUCTIVE SLEEP APNEA 04/02/2010   INSOMNIA 04/02/2010   TEMPOROMANDIBULAR JOINT DISORDER 06/10/2009   DYSPHAGIA 05/15/2009   ESOPHAGITIS, REFLUX 05/13/2009   COUGH DUE TO ACE INHIBITORS 04/12/2009   Hyperlipidemia 02/21/2009   ACNE ROSACEA 02/21/2009   Essential hypertension 02/21/2009      Review of Systems  All other systems reviewed and are negative.     Objective:     BP (!) 130/58 (BP  Location: Left Arm, Patient Position: Sitting, Cuff Size: Large)   Pulse 70   Temp 98.1 F (36.7 C) (Oral)   Ht 6' (1.829 m)   Wt 234 lb 4.8 oz (106.3 kg)   SpO2 99%   BMI 31.78 kg/m    Physical Exam Vitals reviewed.  Constitutional:      Appearance: Normal appearance. He is well-groomed and normal weight.  Eyes:     Conjunctiva/sclera: Conjunctivae normal.  Cardiovascular:     Rate and Rhythm: Normal rate and regular rhythm.     Heart sounds: S1 normal and S2 normal. No murmur heard. Pulmonary:     Effort: Pulmonary effort is normal.     Breath sounds: Normal breath sounds and air entry. No rales.  Musculoskeletal:     Right lower leg: No  edema.     Left lower leg: No edema.  Neurological:     General: No focal deficit present.     Mental Status: He is alert and oriented to person, place, and time.     Gait: Gait is intact.  Psychiatric:        Mood and Affect: Mood and affect normal.      Results for orders placed or performed in visit on 03/12/23  HM DIABETES EYE EXAM  Result Value Ref Range   HM Diabetic Eye Exam No Retinopathy No Retinopathy    Last metabolic panel Lab Results  Component Value Date   GLUCOSE 143 (H) 07/03/2022   NA 137 07/03/2022   K 3.8 07/03/2022   CL 102 07/03/2022   CO2 24 07/03/2022   BUN 17 07/03/2022   CREATININE 0.87 07/03/2022   GFRNONAA >89 05/26/2017   CALCIUM 9.9 07/03/2022   PROT 6.9 07/03/2022   ALBUMIN 4.3 07/03/2022   BILITOT 0.7 07/03/2022   ALKPHOS 78 07/03/2022   AST 36 07/03/2022   ALT 51 07/03/2022   Last lipids Lab Results  Component Value Date   CHOL 152 07/03/2022   HDL 25.40 (L) 07/03/2022   LDLCALC  07/15/2020     Comment:     . LDL cholesterol not calculated. Triglyceride levels greater than 400 mg/dL invalidate calculated LDL results. . Reference range: <100 . Desirable range <100 mg/dL for primary prevention;   <70 mg/dL for patients with CHD or diabetic patients  with > or = 2 CHD risk factors. Marland Kitchen LDL-C is now calculated using the Martin-Hopkins  calculation, which is a validated novel method providing  better accuracy than the Friedewald equation in the  estimation of LDL-C.  Horald Pollen et al. Lenox Ahr. 0960;454(09): 2061-2068  (http://education.[facility].com/faq/FAQ164)    LDLDIRECT 114.0 07/03/2022   TRIG 351.0 (H) 07/03/2022   CHOLHDL 6 07/03/2022   Last hemoglobin A1c Lab Results  Component Value Date   HGBA1C 6.3 (A) 11/26/2022      The 10-year ASCVD risk score (Arnett DK, et al., 2019) is: 42.5%    Assessment & Plan:  Colon cancer screening -     Ambulatory referral to Gastroenterology  Nonrheumatic mitral valve  regurgitation Assessment & Plan: Reviewed ECHO from 2019, it was mild at the time. Will send patient back to cardiology to have it looked at and to see if any additional testing is needed. Pt denies any cardiac symptoms at this time.   Orders: -     Ambulatory referral to Cardiology   I reviewed the patient's last A1C, he reports he is doing well on the current medication, no changes since the last visit.  No follow-ups on file.    Karie Georges, MD

## 2023-03-12 NOTE — Telephone Encounter (Signed)
Left a detailed message to call and schedule his surveillance colonoscopy.

## 2023-03-12 NOTE — Telephone Encounter (Signed)
Set up direct colonoscopy  Encounter Diagnosis  Name Primary?   Personal history of colonic adenoma Yes

## 2023-03-12 NOTE — Telephone Encounter (Signed)
Patient has been scheduled for colonoscopy.

## 2023-03-26 ENCOUNTER — Encounter: Payer: Self-pay | Admitting: Internal Medicine

## 2023-03-26 ENCOUNTER — Ambulatory Visit (AMBULATORY_SURGERY_CENTER): Payer: Commercial Managed Care - PPO

## 2023-03-26 VITALS — Ht 73.5 in | Wt 230.0 lb

## 2023-03-26 DIAGNOSIS — Z86010 Personal history of colonic polyps: Secondary | ICD-10-CM

## 2023-03-26 NOTE — Progress Notes (Signed)

## 2023-04-06 ENCOUNTER — Encounter: Payer: Self-pay | Admitting: Internal Medicine

## 2023-04-23 ENCOUNTER — Encounter: Payer: Self-pay | Admitting: Internal Medicine

## 2023-04-23 ENCOUNTER — Ambulatory Visit (AMBULATORY_SURGERY_CENTER): Payer: Commercial Managed Care - PPO | Admitting: Internal Medicine

## 2023-04-23 VITALS — BP 124/64 | HR 62 | Temp 95.7°F | Resp 14 | Ht 73.5 in | Wt 230.0 lb

## 2023-04-23 DIAGNOSIS — D122 Benign neoplasm of ascending colon: Secondary | ICD-10-CM | POA: Diagnosis not present

## 2023-04-23 DIAGNOSIS — Z09 Encounter for follow-up examination after completed treatment for conditions other than malignant neoplasm: Secondary | ICD-10-CM | POA: Diagnosis not present

## 2023-04-23 DIAGNOSIS — D124 Benign neoplasm of descending colon: Secondary | ICD-10-CM | POA: Diagnosis not present

## 2023-04-23 DIAGNOSIS — Z86010 Personal history of colonic polyps: Secondary | ICD-10-CM

## 2023-04-23 DIAGNOSIS — K635 Polyp of colon: Secondary | ICD-10-CM | POA: Diagnosis not present

## 2023-04-23 MED ORDER — SODIUM CHLORIDE 0.9 % IV SOLN
500.00 mL | Freq: Once | INTRAVENOUS | Status: DC
Start: 2023-04-23 — End: 2023-04-23

## 2023-04-23 NOTE — Progress Notes (Signed)
Called to room to assist during endoscopic procedure.  Patient ID and intended procedure confirmed with present staff. Received instructions for my participation in the procedure from the performing physician.  

## 2023-04-23 NOTE — Patient Instructions (Addendum)
Two tiny polyps removed today. I will let you know pathology results and when/if to have another routine colonoscopy by mail and/or My Chart.  You also have a condition called diverticulosis - common and not usually a problem. Please read the handout provided.  I appreciate the opportunity to care for you. Iva Boop, MD, [facility]  Resume previous diet Continue present medications Await pathology results Handouts/information given for polyps and diverticulosis   YOU HAD AN ENDOSCOPIC PROCEDURE TODAY AT THE [facility]:   Refer to the procedure report that was given to you for any specific questions about what was found during the examination.  If the procedure report does not answer your questions, please call your gastroenterologist to clarify.  If you requested that your care partner not be given the details of your procedure findings, then the procedure report has been included in a sealed envelope for you to review at your convenience later.  YOU SHOULD EXPECT: Some feelings of bloating in the abdomen. Passage of more gas than usual.  Walking can help get rid of the air that was put into your GI tract during the procedure and reduce the bloating. If you had a lower endoscopy (such as a colonoscopy or flexible sigmoidoscopy) you may notice spotting of blood in your stool or on the toilet paper. If you underwent a bowel prep for your procedure, you may not have a normal bowel movement for a few days.  Please Note:  You might notice some irritation and congestion in your nose or some drainage.  This is from the oxygen used during your procedure.  There is no need for concern and it should clear up in a day or so.  SYMPTOMS TO REPORT IMMEDIATELY:  Following lower endoscopy (colonoscopy):  Excessive amounts of blood in the stool  Significant tenderness or worsening of abdominal pains  Swelling of the abdomen that is new, acute  Fever of 100F or higher  For urgent or emergent  issues, a gastroenterologist can be reached at any hour by calling [phone]. Do not use MyChart messaging for urgent concerns.   DIET:  We do recommend a small meal at first, but then you may proceed to your regular diet.  Drink plenty of fluids but you should avoid alcoholic beverages for 24 hours.  ACTIVITY:  You should plan to take it easy for the rest of today and you should NOT DRIVE or use heavy machinery until tomorrow (because of the sedation medicines used during the test).    FOLLOW UP: Our staff will call the number listed on your records the next business day following your procedure.  We will call around 7:15- 8:00 am to check on you and address any questions or concerns that you may have regarding the information given to you following your procedure. If we do not reach you, we will leave a message.     If any biopsies were taken you will be contacted by phone or by letter within the next 1-3 weeks.  Please call us at [phone] if you have not heard about the biopsies in 3 weeks.   SIGNATURES/CONFIDENTIALITY: You and/or your care partner have signed paperwork which will be entered into your electronic medical record.  These signatures attest to the fact that that the information above on your After Visit Summary has been reviewed and is understood.  Full responsibility of the confidentiality of this discharge information lies with you and/or your care-partner.

## 2023-04-23 NOTE — Progress Notes (Signed)
A and O x3. Report to RN. Tolerated MAC anesthesia well. 

## 2023-04-23 NOTE — Progress Notes (Signed)
Pt's states no medical or surgical changes since previsit or office visit. 

## 2023-04-23 NOTE — Progress Notes (Signed)
[facility] History and Physical   Primary Care Physician:  Karie Georges, MD   Reason for Procedure:   Hx colon polyps  Plan:    colonoscopy     HPI: Derrick Brown is a 72 y.o. male for polyp surveillance 10/2012 - diminutive adenoma 01/26/2018 diminutive cecal and transverse polyps removed adenomas (2)   Past Medical History:  Diagnosis Date   ACNE ROSACEA 02/21/2009   Allergy    COUGH DUE TO ACE INHIBITORS 04/12/2009   Diabetes mellitus without complication (HCC)    type 2   DYSPHAGIA 05/15/2009   ESOPHAGITIS, REFLUX 05/13/2009   FATIGUE 03/14/2010   Fatty liver    GERD (gastroesophageal reflux disease)    HYPERGLYCEMIA 02/21/2009   HYPERLIPIDEMIA 02/21/2009   Hypertension    HYPERTENSION NEC 02/21/2009   INSOMNIA 04/02/2010   MUSCLE PAIN 05/13/2009   OBSTRUCTIVE SLEEP APNEA 04/02/2010   RENAL CALCULUS 02/21/2009   Sleep apnea    TEMPOROMANDIBULAR JOINT DISORDER 06/10/2009   Unspecified hypertrophic and atrophic condition of skin     Past Surgical History:  Procedure Laterality Date   COLONOSCOPY     ESOPHAGOGASTRODUODENOSCOPY     KIDNEY STONE SURGERY     removal   TONSILLECTOMY AND ADENOIDECTOMY      Prior to Admission medications   Medication Sig Start Date End Date Taking? Authorizing Provider  amLODipine (NORVASC) 5 MG tablet Take 1 tablet (5 mg total) by mouth daily. 07/03/22  Yes Karie Georges, MD  aspirin 81 MG tablet Take 81 mg by mouth daily.   Yes [provider]  atorvastatin (LIPITOR) 20 MG tablet TAKE 1 TABLET BY MOUTH EVERY OTHER DAY 01/04/23  Yes Karie Georges, MD  dapagliflozin propanediol (FARXIGA) 10 MG TABS tablet TAKE 1 TABLET BY MOUTH EVERY DAY BEFORE BREAKFAST 11/26/22  Yes Carlus Pavlov, MD  fenofibrate (TRICOR) 145 MG tablet Take 1 tablet (145 mg total) by mouth daily. 11/26/22  Yes Carlus Pavlov, MD  glipiZIDE (GLUCOTROL) 5 MG tablet Take 2.5 mg as needed before a larger meal 10/07/22  Yes  Carlus Pavlov, MD  losartan (COZAAR) 100 MG tablet Take 1 tablet (100 mg total) by mouth daily. 07/03/22  Yes Karie Georges, MD  metFORMIN (GLUCOPHAGE-XR) 750 MG 24 hr tablet Take 1 tablet (750 mg total) by mouth 2 (two) times daily. 11/26/22  Yes Carlus Pavlov, MD  Multiple Vitamin (MULTIVITAMIN) tablet Take 1 tablet by mouth daily.   Yes [provider]  Omega-3 Fatty Acids (FISH OIL) 1000 MG CAPS Take 2,000 mg by mouth 2 (two) times daily.     Yes [provider]  OVER THE COUNTER MEDICATION Vitamin C 500 mg, one tablet daily.   Yes [provider]  OVER THE COUNTER MEDICATION Cinnamon 1000 mg one tablet daily.   Yes [provider]  OVER THE COUNTER MEDICATION CQ10 one tablet daily.   Yes [provider]  pantoprazole (PROTONIX) 40 MG tablet TAKE 1 TABLET BY MOUTH EVERY DAY 04/14/22  Yes Carlus Pavlov, MD  RYBELSUS 7 MG TABS TAKE 1 TABLET BY MOUTH EVERY DAY BEFORE BREAKFAST 10/05/22  Yes Carlus Pavlov, MD  Saw Palmetto 450 MG CAPS Take by mouth daily.   Yes [provider]  betamethasone dipropionate 0.05 % cream APPLY TO AFFECTED AREA TWICE A DAY Patient not taking: Reported on 03/26/2023 05/01/22   Worthy Rancher B, FNP  traZODone (DESYREL) 100 MG tablet TAKE 1 TABLET BY MOUTH EVERYDAY AT BEDTIME 07/03/22  Karie Georges, MD  triamcinolone cream (KENALOG) 0.1 % Apply topically 2 (two) times daily as needed. 12/08/19   Wynn Banker, MD    Current Outpatient Medications  Medication Sig Dispense Refill   amLODipine (NORVASC) 5 MG tablet Take 1 tablet (5 mg total) by mouth daily. 90 tablet 3   aspirin 81 MG tablet Take 81 mg by mouth daily.     atorvastatin (LIPITOR) 20 MG tablet TAKE 1 TABLET BY MOUTH EVERY OTHER DAY 45 tablet 1   dapagliflozin propanediol (FARXIGA) 10 MG TABS tablet TAKE 1 TABLET BY MOUTH EVERY DAY BEFORE BREAKFAST 90 tablet 3   fenofibrate (TRICOR) 145 MG tablet Take 1 tablet (145 mg total) by  mouth daily. 90 tablet 3   glipiZIDE (GLUCOTROL) 5 MG tablet Take 2.5 mg as needed before a larger meal 90 tablet 1   losartan (COZAAR) 100 MG tablet Take 1 tablet (100 mg total) by mouth daily. 90 tablet 3   metFORMIN (GLUCOPHAGE-XR) 750 MG 24 hr tablet Take 1 tablet (750 mg total) by mouth 2 (two) times daily. 180 tablet 3   Multiple Vitamin (MULTIVITAMIN) tablet Take 1 tablet by mouth daily.     Omega-3 Fatty Acids (FISH OIL) 1000 MG CAPS Take 2,000 mg by mouth 2 (two) times daily.       OVER THE COUNTER MEDICATION Vitamin C 500 mg, one tablet daily.     OVER THE COUNTER MEDICATION Cinnamon 1000 mg one tablet daily.     OVER THE COUNTER MEDICATION CQ10 one tablet daily.     pantoprazole (PROTONIX) 40 MG tablet TAKE 1 TABLET BY MOUTH EVERY DAY 90 tablet 0   RYBELSUS 7 MG TABS TAKE 1 TABLET BY MOUTH EVERY DAY BEFORE BREAKFAST 90 tablet 3   Saw Palmetto 450 MG CAPS Take by mouth daily.     betamethasone dipropionate 0.05 % cream APPLY TO AFFECTED AREA TWICE A DAY (Patient not taking: Reported on 03/26/2023) 30 g 1   traZODone (DESYREL) 100 MG tablet TAKE 1 TABLET BY MOUTH EVERYDAY AT BEDTIME 90 tablet 3   triamcinolone cream (KENALOG) 0.1 % Apply topically 2 (two) times daily as needed. 30 g 0   Current Facility-Administered Medications  Medication Dose Route Frequency Provider Last Rate Last Admin   0.9 %  sodium chloride infusion  500 mL Intravenous Once Iva Boop, MD        Allergies as of 04/23/2023 - Review Complete 04/23/2023  Allergen Reaction Noted   Hctz [hydrochlorothiazide] Hives, Itching, and Rash     Family History  Problem Relation Age of Onset   Stroke Mother 65   Rectal cancer Sister    Heart attack Father 51   Healthy Brother    Colon polyps Sister    Heart disease Other        family histrory   Diabetes Paternal Grandmother    Colon cancer Neg Hx    Esophageal cancer Neg Hx    Liver cancer Neg Hx    Pancreatic cancer Neg Hx    Stomach cancer Neg Hx      Social History   Socioeconomic History   Marital status: Married    Spouse name: Not on file   Number of children: Not on file   Years of education: Not on file   Highest education level: Bachelor's degree (e.g., BA, AB, BS)  Occupational History   Not on file  Tobacco Use   Smoking status: Never   Smokeless tobacco: Never  Vaping Use   Vaping Use: Never used  Substance and Sexual Activity   Alcohol use: Yes    Alcohol/week: 3.0 standard drinks of alcohol    Types: 2 Glasses of wine, 1 Standard drinks or equivalent per week   Drug use: No   Sexual activity: Not on file  Other Topics Concern   Not on file  Social History Narrative   Not on file   Social Determinants of Health   Financial Resource Strain: Low Risk  (03/09/2023)   Overall Financial Resource Strain (CARDIA)    Difficulty of Paying Living Expenses: Not hard at all  Food Insecurity: No Food Insecurity (03/09/2023)   Hunger Vital Sign    Worried About Running Out of Food in the Last Year: Never true    Ran Out of Food in the Last Year: Never true  Transportation Needs: No Transportation Needs (03/09/2023)   PRAPARE - Administrator, Civil Service (Medical): No    Lack of Transportation (Non-Medical): No  Physical Activity: Insufficiently Active (03/09/2023)   Exercise Vital Sign    Days of Exercise per Week: 3 days    Minutes of Exercise per Session: 30 min  Stress: Stress Concern Present (03/09/2023)   [employer] of Occupational Health - Occupational Stress Questionnaire    Feeling of Stress : Very much  Social Connections: Moderately Isolated (03/09/2023)   Social Connection and Isolation Panel [NHANES]    Frequency of Communication with Friends and Family: More than three times a week    Frequency of Social Gatherings with Friends and Family: Once a week    Attends Religious Services: Never    Database administrator or Organizations: No    Attends Engineer, structural: Not on  file    Marital Status: Married  Catering manager Violence: Not on file    Review of Systems:  All other review of systems negative except as mentioned in the HPI.  Physical Exam: Vital signs BP 95/77   Pulse 68   Temp (!) 95.7 F (35.4 C)   Resp 17   Ht 6' 1.5" (1.867 m)   Wt 230 lb (104.3 kg)   SpO2 98%   BMI 29.93 kg/m   General:   Alert,  Well-developed, well-nourished, pleasant and cooperative in NAD Lungs:  Clear throughout to auscultation.   Heart:  Regular rate and rhythm; no murmurs, clicks, rubs,  or gallops. Abdomen:  Soft, nontender and nondistended. Normal bowel sounds.   Neuro/Psych:  Alert and cooperative. Normal mood and affect. A and O x 3   @Lethia Donlon  Sena Slate, MD, [facility] [phone] (pager) 04/23/2023 8:31 AM@

## 2023-04-23 NOTE — Op Note (Signed)
[facility] Patient Name: Derrick Brown Procedure Date: 04/23/2023 8:26 AM MRN: [id number] Endoscopist: Iva Boop , MD, [id number] Age: 72 Referring MD:  Date of Birth: 05/16/1951 Gender: Male Account #: [id number] Procedure:                Colonoscopy Indications:              Surveillance: Personal history of adenomatous                            polyps on last colonoscopy 5 years ago, Last                            colonoscopy: 2019 Medicines:                Monitored Anesthesia Care Procedure:                Pre-Anesthesia Assessment:                           - Prior to the procedure, a History and Physical                            was performed, and patient medications and                            allergies were reviewed. The patient's tolerance of                            previous anesthesia was also reviewed. The risks                            and benefits of the procedure and the sedation                            options and risks were discussed with the patient.                            All questions were answered, and informed consent                            was obtained. Prior Anticoagulants: The patient has                            taken no anticoagulant or antiplatelet agents. ASA                            Grade Assessment: III - A patient with severe                            systemic disease. After reviewing the risks and                            benefits, the patient was deemed in satisfactory  condition to undergo the procedure.                           After obtaining informed consent, the colonoscope                            was passed under direct vision. Throughout the                            procedure, the patient's blood pressure, pulse, and                            oxygen saturations were monitored continuously. The                            Olympus Scope SN: [id number] was introduced through                             the anus and advanced to the the cecum, identified                            by appendiceal orifice and ileocecal valve. The                            colonoscopy was performed without difficulty. The                            patient tolerated the procedure well. The quality                            of the bowel preparation was good. The bowel                            preparation used was Miralax via split dose                            instruction. The ileocecal valve, appendiceal                            orifice, and rectum were photographed. Scope In: 8:39:09 AM Scope Out: 8:57:46 AM Scope Withdrawal Time: 0 hours 15 minutes 38 seconds  Total Procedure Duration: 0 hours 18 minutes 37 seconds  Findings:                 The digital rectal exam findings include enlarged                            prostate. Pertinent negatives include no palpable                            rectal lesions.                           Two sessile polyps were found in the descending  colon. The polyps were diminutive in size. These                            polyps were removed with a cold snare. Resection                            and retrieval were complete. Verification of                            patient identification for the specimen was done.                            Estimated blood loss was minimal.                           Multiple diverticula were found in the sigmoid                            colon and descending colon.                           The exam was otherwise without abnormality on                            direct and retroflexion views. Complications:            No immediate complications. Estimated Blood Loss:     Estimated blood loss was minimal. Impression:               - Enlarged prostate found on digital rectal exam.                           - Two diminutive polyps in the descending colon,                             removed with a cold snare. Resected and retrieved.                           - Diverticulosis in the sigmoid colon and in the                            descending colon.                           - The examination was otherwise normal on direct                            and retroflexion views.                           - Personal history of colonic polyps. 10/2012 -                            diminutive adenoma  01/26/2018 diminutive cecal and transverse polyps                            removed adenomas (2) Recommendation:           - Patient has a contact number available for                            emergencies. The signs and symptoms of potential                            delayed complications were discussed with the                            patient. Return to normal activities tomorrow.                            Written discharge instructions were provided to the                            patient.                           - Resume previous diet.                           - Continue present medications.                           - Await pathology results.                           - Repeat colonoscopy may be recommended for                            surveillance. The colonoscopy date will be                            determined after pathology results from today's                            exam become available for review. Iva Boop, MD 04/23/2023 9:04:30 AM This report has been signed electronically.

## 2023-04-26 ENCOUNTER — Telehealth: Payer: Self-pay

## 2023-04-26 NOTE — Telephone Encounter (Signed)
  Follow up Call-     04/23/2023    7:51 AM  Call back number  Post procedure Call Back phone  # [phone]  Permission to leave phone message Yes     Patient questions:  Do you have a fever, pain , or abdominal swelling? No. Pain Score  0 *  Have you tolerated food without any problems? Yes.    Have you been able to return to your normal activities? Yes.    Do you have any questions about your discharge instructions: Diet   No. Medications  No. Follow up visit  No.  Do you have questions or concerns about your Care? No.  Actions: * If pain score is 4 or above: No action needed, pain <4.

## 2023-05-09 ENCOUNTER — Encounter: Payer: Self-pay | Admitting: Internal Medicine

## 2023-05-20 ENCOUNTER — Encounter: Payer: Self-pay | Admitting: Cardiovascular Disease

## 2023-05-20 NOTE — Progress Notes (Signed)
  Cardiology Office Note:  .   Date:  05/21/2023  ID:  Derrick Brown, DOB 02-12-1951, MRN [id number] PCP: Karie Georges, MD  [facility] Providers Cardiologist: Garhett Bernhard  Click to update primary MD,subspecialty MD or APP then REFRESH:1}   History of Present Illness: . Aug. 2, 2024   Derrick Brown is a 72 y.o. male with hx of MR, strong family hx of CAD , DM   We are asked to see him for eval of his cardiac risk   Echocardiogram from July 19, 2018 reveals mild mitral regurgitation. Normal LV systolic function, grade I DD     No cp,  no DOE   Does not exercise   Hunts birds several times a year , without any difficulty . Non smoker    Labs from his primary medical doctor reveal a total cholesterol of 152 HDL is 25 LDL is 114 Triglyceride level is 351.   Work as a Surveyor, minerals in [place]   ROS:   Studies Reviewed: Marland Kitchen     EKG Interpretation Date/Time:  Friday May 21 2023 08:55:24 EDT Ventricular Rate:  78 PR Interval:  220 QRS Duration:  132 QT Interval:  418 QTC Calculation: 476 R Axis:   -71  Text Interpretation: Sinus rhythm with 1st degree A-V block Left axis deviation Right bundle branch block Septal infarct , age undetermined No previous ECGs available Confirmed by Kristeen Miss [phone]) on 05/21/2023 9:07:35 AM   Risk Assessment/Calculations:             Physical Exam:   VS:  BP 130/70   Pulse 78   Ht 6\' 2"  (1.88 m)   Wt 232 lb (105.2 kg)   SpO2 97%   BMI 29.79 kg/m    Wt Readings from Last 3 Encounters:  05/21/23 232 lb (105.2 kg)  04/23/23 230 lb (104.3 kg)  03/26/23 230 lb (104.3 kg)    GEN: Well nourished, well developed in no acute distress NECK: No JVD; No carotid bruits CARDIAC: RRR,  very soft systolic murmur  RESPIRATORY:  Clear to auscultation without rales, wheezing or rhonchi  ABDOMEN: Soft, non-tender, non-distended EXTREMITIES:  No edema; No deformity   ASSESSMENT AND PLAN: .    1.  Family  history of coronary artery disease: Patient has a strong family history of coronary artery disease.  He has a history of hyperlipidemia and especially hyper triglyceridemia.  His last LDL is 114.  He is on low-dose atorvastatin.  Will recheck lipids, ALT, basic metabolic profile and also lipoprotein a.  Will get a Coronary Calcium score   I encouraged him to work on limiting his carbohydrates.  Have asked him to exercise on a regular basis.         Dispo: 6 months   Signed, Kristeen Miss, MD

## 2023-05-21 ENCOUNTER — Ambulatory Visit: Payer: Commercial Managed Care - PPO | Admitting: Cardiovascular Disease

## 2023-05-21 VITALS — BP 130/70 | HR 78 | Ht 74.0 in | Wt 232.0 lb

## 2023-05-21 DIAGNOSIS — Z7984 Long term (current) use of oral hypoglycemic drugs: Secondary | ICD-10-CM

## 2023-05-21 DIAGNOSIS — Z136 Encounter for screening for cardiovascular disorders: Secondary | ICD-10-CM

## 2023-05-21 DIAGNOSIS — E119 Type 2 diabetes mellitus without complications: Secondary | ICD-10-CM

## 2023-05-21 DIAGNOSIS — Z8249 Family history of ischemic heart disease and other diseases of the circulatory system: Secondary | ICD-10-CM | POA: Diagnosis not present

## 2023-05-21 DIAGNOSIS — I340 Nonrheumatic mitral (valve) insufficiency: Secondary | ICD-10-CM | POA: Diagnosis not present

## 2023-05-21 NOTE — Patient Instructions (Signed)
Medication Instructions:  Your physician recommends that you continue on your current medications as directed. Please refer to the Current Medication list given to you today.  *If you need a refill on your cardiac medications before your next appointment, please call your pharmacy*  Lab Work: Lipids, ALT, BMET, Lipoprotein(a) today If you have labs (blood work) drawn today and your tests are completely normal, you will receive your results only by: MyChart Message (if you have MyChart) OR A paper copy in the mail If you have any lab test that is abnormal or we need to change your treatment, we will call you to review the results.  Testing/Procedures: Coronary calcium score CT Non-Cardiac CT scanning, (CAT scanning), is a noninvasive, special x-ray that produces cross-sectional images of the body using x-rays and a computer. CT scans help physicians diagnose and treat medical conditions. For some CT exams, a contrast material is used to enhance visibility in the area of the body being studied. CT scans provide greater clarity and reveal more details than regular x-ray exams.  Follow-Up: At [facility], you and your health needs are our priority.  As part of our continuing mission to provide you with exceptional heart care, we have created designated Provider Care Teams.  These Care Teams include your primary Cardiologist (physician) and Advanced Practice Providers (APPs -  Physician Assistants and Nurse Practitioners) who all work together to provide you with the care you need, when you need it.  Your next appointment:   6 month(s)  Provider:   Kristeen Miss, MD    Other Instructions  Avoid/ limite  foods that are White, wheat, sweet  - white foods - limit your  rice, pasta, potatoes, corn.  if you do eat these, make sure its brown rice, whole wheat pasta  - wheat - limit intake of all bread, biscuits, pizza dough.   The bread you do eat should be whole wheat bread.  -  Sweets - limit cakes, cookies, desserts, ice cream, soda, sweet tea   Increase exercise   Adopting a Healthy Lifestyle.   Weight: Know what a healthy weight is for you (roughly BMI <25) and aim to maintain this. You can calculate your body mass index on your smart phone. Unfortunately, this is not the most accurate measure of healthy weight, but it is the simplest measurement to use. A more accurate measurement involves body scanning which measures lean muscle, fat tissue and bony density. We do not have this equipment at [facility].    Diet: Aim for 7+ servings of fruits and vegetables daily Limit animal fats in diet for cholesterol and heart health - choose grass fed whenever available Avoid highly processed foods (fast food burgers, tacos, fried chicken, pizza, hot dogs, french fries)  Saturated fat comes in the form of butter, lard, coconut oil, margarine, partially hydrogenated oils, and fat in meat. These increase your risk of cardiovascular disease.  Use healthy plant oils, such as olive, canola, soy, corn, sunflower and peanut.  Whole foods such as fruits, vegetables and whole grains have fiber  Men need > 38 grams of fiber per day Women need > 25 grams of fiber per day  Load up on vegetables and fruits - one-half of your plate: Aim for color and variety, and remember that potatoes dont count. Go for whole grains - one-quarter of your plate: Whole wheat, barley, wheat berries, quinoa, oats, brown rice, and foods made with them. If you want pasta, go with whole wheat pasta.  Protein power - one-quarter of your plate: Fish, chicken, beans, and nuts are all healthy, versatile protein sources. Limit red meat. You need carbohydrates for energy! The type of carbohydrate is more important than the amount. Choose carbohydrates such as vegetables, fruits, whole grains, beans, and nuts in the place of white rice, white pasta, potatoes (baked or fried), macaroni and cheese, cakes, cookies, and donuts.   If youre thirsty, drink water. Coffee and tea are good in moderation, but skip sugary drinks and limit milk and dairy products to one or two daily servings. Keep sugar intake at 6 teaspoons or 24 grams or LESS       Exercise: Aim for 150 min of moderate intensity exercise weekly for heart health, and weights twice weekly for bone health Stay active - any steps are better than no steps! Aim for 7-9 hours of sleep daily

## 2023-05-24 ENCOUNTER — Telehealth: Payer: Self-pay

## 2023-05-24 DIAGNOSIS — E782 Mixed hyperlipidemia: Secondary | ICD-10-CM

## 2023-05-24 DIAGNOSIS — Z79899 Other long term (current) drug therapy: Secondary | ICD-10-CM

## 2023-05-24 MED ORDER — ROSUVASTATIN CALCIUM 20 MG PO TABS
20.0000 mg | ORAL_TABLET | Freq: Every day | ORAL | 3 refills | Status: DC
Start: 2023-05-24 — End: 2024-04-26

## 2023-05-24 NOTE — Telephone Encounter (Signed)
Pt returned call to office and agreed to plan to switch Lipitor to rosuvastatin. Medication sent to pharmacy on file. Labs entered and scheduled for 09/14/23.

## 2023-05-24 NOTE — Telephone Encounter (Signed)
-----   Message from Kristeen Miss sent at 05/23/2023  4:03 PM EDT ----- LDL is elevated DC atorvastatin  Start rosuvastatin 20 mg a day  Lipids and ALT in 3 months

## 2023-06-03 ENCOUNTER — Encounter (INDEPENDENT_AMBULATORY_CARE_PROVIDER_SITE_OTHER): Payer: Self-pay

## 2023-06-03 ENCOUNTER — Ambulatory Visit: Payer: Commercial Managed Care - PPO | Admitting: Internal Medicine

## 2023-06-03 ENCOUNTER — Encounter: Payer: Self-pay | Admitting: Internal Medicine

## 2023-06-03 VITALS — BP 130/80 | HR 81 | Ht 74.0 in | Wt 234.2 lb

## 2023-06-03 DIAGNOSIS — E785 Hyperlipidemia, unspecified: Secondary | ICD-10-CM

## 2023-06-03 DIAGNOSIS — E669 Obesity, unspecified: Secondary | ICD-10-CM

## 2023-06-03 DIAGNOSIS — E1165 Type 2 diabetes mellitus with hyperglycemia: Secondary | ICD-10-CM | POA: Diagnosis not present

## 2023-06-03 DIAGNOSIS — E119 Type 2 diabetes mellitus without complications: Secondary | ICD-10-CM

## 2023-06-03 DIAGNOSIS — Z7984 Long term (current) use of oral hypoglycemic drugs: Secondary | ICD-10-CM

## 2023-06-03 LAB — POCT GLYCOSYLATED HEMOGLOBIN (HGB A1C): Hemoglobin A1C: 6.5 % — AB (ref 4.0–5.6)

## 2023-06-03 MED ORDER — RYBELSUS 14 MG PO TABS: 14.0000 mg | ORAL_TABLET | Freq: Every day | ORAL | 3 refills | Status: DC

## 2023-06-03 NOTE — Progress Notes (Signed)
Patient ID: Derrick Brown, male   DOB: 1950-11-13, 72 y.o.   MRN: [id number]   HPI: Derrick Brown is a 72 y.o.-year-old 72 y.o.-year-old male, returning for follow-up for DM2, dx in ~2007, non-insulin-dependent, now controlled, without long term complications. Last visit 6 months ago.  Interim history: No increased urination, blurry vision, nausea, chest pain. Continues to work comes home late, around 8 PM, having dinner after 8:30.  Reviewed HbA1c levels: Lab Results  Component Value Date   HGBA1C 6.3 (A) 11/26/2022   HGBA1C 6.1 (A) 07/15/2022   HGBA1C 6.0 (A) 01/09/2022   Pt is on a regimen of: - Metformin ER 750 mg twice a day >> 1500 (750 x2) mg at dinnertime - Rybelsus 7 mg before breakfast - Farxiga 10 mg before breakfast - Glipizide 5 mg as needed before a larger meal >> 2.5 mg QOD >> with larger meals only - 2-3x a week In 02/2019 we tried Rybelsus but we had to stop due to GI side effects and sugars were also more fluctuating.  We restarted Januvia afterwards. Previously on Invokana, then [place]. Previously on Januvia.  Pt checks his sugars twice a day per review of his log: - am:  72, 103-143, 156 >> 88, 104-139, 146, 164 >> 94-148, 166 - 2h after b'fast: n/c >> 198, 200 >> n/c - before lunch: n/c - 2h after lunch: n/c - before dinner: n/c - 2h after dinner: n/c - bedtime: 61, 66-148, 155, 165 >> 86, 97-154, 168, 176 >> 71, 84-188, 194 - nighttime: n/c Lowest sugar was 61 >> 88. Highest sugar was 228 >> .Marland KitchenMarland Kitchen 176.  Glucometer: Freestyle Lite  Pt's meals are: - Breakfast: oatmeal + blueberries + pecan; cereals; fruit juice; coffee - Lunch: chicken salad or sandwich - late - Dinner: cereal or light dinner - late >> eating out more - Snacks: takes snack boxes to work: yoghurt, veggies, grapes/prunes, chips/cheetos No formal exercise.  No CKD, last BUN/creatinine:  Lab Results  Component Value Date   BUN 12 05/21/2023   BUN 17 07/03/2022   CREATININE 0.98 05/21/2023   CREATININE  0.87 07/03/2022  On losartan.  No MAU: Lab Results  Component Value Date   MICRALBCREAT 3.2 07/03/2022   MICRALBCREAT 1.3 07/02/2021   MICRALBCREAT 1.3 02/23/2020   MICRALBCREAT 1.3 11/30/2018   MICRALBCREAT 1.4 07/04/2018   MICRALBCREAT 1.2 06/30/2017   MICRALBCREAT 1.5 07/28/2016   MICRALBCREAT 1.0 10/18/2014   MICRALBCREAT 1.1 04/23/2014   MICRALBCREAT 0.8 09/08/2013   + HL; last set of lipids: Lab Results  Component Value Date   CHOL 186 05/21/2023   CHOL 152 07/03/2022   CHOL 145 07/02/2021   Lab Results  Component Value Date   HDL 30 (L) 05/21/2023   HDL 25.40 (L) 07/03/2022   HDL 27.80 (L) 07/02/2021   Lab Results  Component Value Date   LDLCALC 121 (H) 05/21/2023   LDLCALC  07/15/2020     Comment:     . LDL cholesterol not calculated. Triglyceride levels greater than 400 mg/dL invalidate calculated LDL results. . Reference range: <100 . Desirable range <100 mg/dL for primary prevention;   <70 mg/dL for patients with CHD or diabetic patients  with > or = 2 CHD risk factors. Marland Kitchen LDL-C is now calculated using the Martin-Hopkins  calculation, which is a validated novel method providing  better accuracy than the Friedewald equation in the  estimation of LDL-C.  Horald Pollen et al. Lenox Ahr. 0626;948(54): 2061-2068  (http://education.[facility].com/faq/FAQ164)    LDLCALC 74  07/04/2018   Lab Results  Component Value Date   TRIG 196 (H) 05/21/2023   TRIG 351.0 (H) 07/03/2022   TRIG 299.0 (H) 07/02/2021   Lab Results  Component Value Date   CHOLHDL 6.2 (H) 05/21/2023   CHOLHDL 6 07/03/2022   CHOLHDL 5 07/02/2021   Lab Results  Component Value Date   LDLDIRECT 114.0 07/03/2022   LDLDIRECT 89.0 07/02/2021   LDLDIRECT 83.0 02/23/2020  Previously on Lipitor 20 every other day but changed to Crestor 20 mg daily in 05/2023.  He is also on fish oil 2000 mg twice a day. Fenofibrate 48 mg daily.  Vascepa was not covered by his insurance.  - last eye exam  was: 04/2023: No DR, +  cataracts.  Switched from Dr. Nile Riggs (who retired) to Dr. [place].  - no numbness and tingling in his feet.  Foot exam 06/2022.  She also has HTN, GERD.  ROS: + see HPI  I reviewed pt's medications, allergies, PMH, social hx, family hx, and changes were documented in the history of present illness. Otherwise, unchanged from my initial visit note.  Past Medical History:  Diagnosis Date   ACNE ROSACEA 02/21/2009   Allergy    COUGH DUE TO ACE INHIBITORS 04/12/2009   Diabetes mellitus without complication (HCC)    type 2   DYSPHAGIA 05/15/2009   ESOPHAGITIS, REFLUX 05/13/2009   FATIGUE 03/14/2010   Fatty liver    GERD (gastroesophageal reflux disease)    HYPERGLYCEMIA 02/21/2009   HYPERLIPIDEMIA 02/21/2009   Hypertension    HYPERTENSION NEC 02/21/2009   INSOMNIA 04/02/2010   MUSCLE PAIN 05/13/2009   OBSTRUCTIVE SLEEP APNEA 04/02/2010   RENAL CALCULUS 02/21/2009   Sleep apnea    TEMPOROMANDIBULAR JOINT DISORDER 06/10/2009   Unspecified hypertrophic and atrophic condition of skin    Past Surgical History:  Procedure Laterality Date   COLONOSCOPY     ESOPHAGOGASTRODUODENOSCOPY     KIDNEY STONE SURGERY     removal   TONSILLECTOMY AND ADENOIDECTOMY     Social History   Social History   Marital status: Married    Spouse name: N/A   Social History Main Topics   Smoking status: Never Smoker   Smokeless tobacco: Never Used   Alcohol use 1.7 oz/week    1 burbon   Drug use: No   Current Outpatient Medications on File Prior to Visit  Medication Sig Dispense Refill   amLODipine (NORVASC) 5 MG tablet Take 1 tablet (5 mg total) by mouth daily. 90 tablet 3   aspirin 81 MG tablet Take 81 mg by mouth daily.     betamethasone dipropionate 0.05 % cream APPLY TO AFFECTED AREA TWICE A DAY 30 g 1   dapagliflozin propanediol (FARXIGA) 10 MG TABS tablet TAKE 1 TABLET BY MOUTH EVERY DAY BEFORE BREAKFAST 90 tablet 3   fenofibrate (TRICOR) 145 MG tablet Take 1  tablet (145 mg total) by mouth daily. 90 tablet 3   glipiZIDE (GLUCOTROL) 5 MG tablet Take 2.5 mg as needed before a larger meal 90 tablet 1   losartan (COZAAR) 100 MG tablet Take 1 tablet (100 mg total) by mouth daily. 90 tablet 3   metFORMIN (GLUCOPHAGE-XR) 750 MG 24 hr tablet Take 1 tablet (750 mg total) by mouth 2 (two) times daily. 180 tablet 3   Multiple Vitamin (MULTIVITAMIN) tablet Take 1 tablet by mouth daily.     Omega-3 Fatty Acids (FISH OIL) 1000 MG CAPS Take 2,000 mg by mouth 2 (two) times daily.  OVER THE COUNTER MEDICATION Vitamin C 500 mg, one tablet daily.     OVER THE COUNTER MEDICATION Cinnamon 1000 mg one tablet daily.     OVER THE COUNTER MEDICATION CQ10 one tablet daily.     pantoprazole (PROTONIX) 40 MG tablet TAKE 1 TABLET BY MOUTH EVERY DAY 90 tablet 0   rosuvastatin (CRESTOR) 20 MG tablet Take 1 tablet (20 mg total) by mouth daily. 90 tablet 3   RYBELSUS 7 MG TABS TAKE 1 TABLET BY MOUTH EVERY DAY BEFORE BREAKFAST 90 tablet 3   Saw Palmetto 450 MG CAPS Take by mouth daily.     traZODone (DESYREL) 100 MG tablet TAKE 1 TABLET BY MOUTH EVERYDAY AT BEDTIME 90 tablet 3   triamcinolone cream (KENALOG) 0.1 % Apply topically 2 (two) times daily as needed. 30 g 0   No current facility-administered medications on file prior to visit.   Allergies  Allergen Reactions   Hctz [Hydrochlorothiazide] Hives, Itching and Rash   Family History  Problem Relation Age of Onset   Stroke Mother 83   Rectal cancer Sister    Heart attack Father 25   Healthy Brother    Colon polyps Sister    Heart disease Other        family histrory   Diabetes Paternal Grandmother    Colon cancer Neg Hx    Esophageal cancer Neg Hx    Liver cancer Neg Hx    Pancreatic cancer Neg Hx    Stomach cancer Neg Hx    PE: BP 130/80   Pulse 81   Ht 6\' 2"  (1.88 m)   Wt 234 lb 3.2 oz (106.2 kg)   SpO2 97%   BMI 30.07 kg/m  Wt Readings from Last 3 Encounters:  06/03/23 234 lb 3.2 oz (106.2 kg)   05/21/23 232 lb (105.2 kg)  04/23/23 230 lb (104.3 kg)   Constitutional: overweight, in NAD Eyes:  EOMI, no exophthalmos ENT: no neck masses, no cervical lymphadenopathy Cardiovascular: RRR, No MRG Respiratory: CTA B Musculoskeletal: no deformities Skin:no rashes Neurological: no tremor with outstretched hands Diabetic Foot Exam - Simple   Simple Foot Form Diabetic Foot exam was performed with the following findings: Yes 06/03/2023  8:11 AM  Visual Inspection No deformities, no ulcerations, no other skin breakdown bilaterally: Yes Sensation Testing Intact to touch and monofilament testing bilaterally: Yes Pulse Check Posterior Tibialis and Dorsalis pulse intact bilaterally: Yes Comments     ASSESSMENT: 1. DM2, non-insulin-dependent, uncontrolled, without long term complications, but with hyperglycemia  2. Obesity class 1  3. HL  PLAN:  1. Patient with longstanding, previously uncontrolled type 2 diabetes, with improved control after starting an SGLT2 inhibitor.  He also continues on metformin, low-dose sulfonylurea as needed before large meal, and daily GLP-1 receptor agonist.  A PA for Rybelsus was approved since last visit. -At last visit, sugars were still mostly at goal with only occasional hyperglycemic spikes in the morning in the 140s and even up to 160.  He also had an occasional blood sugar in the 80s but no lows.  We did not change his regimen but I advised him to experiment with taking metformin earlier, before dinner to see if this would help his blood sugars in the morning. -At today's visit, he has more higher blood sugars in the morning and also slightly higher CBGs after dinner.  We discussed about options.  He does not feel that he can change the timing or the consistency of his dinners.  I am reticent to increase his glipizide, since he did have spontaneous blood sugars in the 70s or 80s occasionally after dinner without glipizide.  Therefore, we discussed about  possibly increasing Rybelsus dose and he agrees to do so.  I advised him to take 40 mg in the morning.  Sent a new prescription to his pharmacy.  Until he gets this, he can double up on the dose that he has at home.  Will continue the rest of the regimen. - I suggested to:  Patient Instructions  Please continue: - Metformin ER 1500 mg at night - Glipizide 2.5 mg as needed before a larger meal - Farxiga 10 mg before b'fast  Increase: - Rybelsus 14 mg before b'fast  Please return in 4-6 months with your sugar log.   - we checked his HbA1c: 6.5% (higher) - advised to check sugars at different times of the day - 1x a day, rotating check times - advised for yearly eye exams >> he is UTD - return to clinic in 6 months.   2. Obesity class 1 -continue SGLT 2 inhibitor and GLP-1 receptor agonist which should also help with weight loss -She was previously not able to tolerate Rybelsus due to GI symptoms, but we restarted this and he now tolerates it well.  Retrospectively, previous GI symptoms could have been related to his COVID-19 infection. -His weight was approximately stable at last visit  3. HL -Reviewed latest lipid panel from 05/2023: All fractions were abnormal: Lab Results  Component Value Date   CHOL 186 05/21/2023   HDL 30 (L) 05/21/2023   LDLCALC 121 (H) 05/21/2023   LDLDIRECT 114.0 07/03/2022   TRIG 196 (H) 05/21/2023   CHOLHDL 6.2 (H) 05/21/2023  -He was on Lipitor 20 mg every other day, but now on Crestor 20 mg daily, changed after the above results returned.  He is also on fish oil 2000 mg twice a day and at last visit I advised him to increase fenofibrate from 48 mg daily to 145 mg daily.  He is using this dose now.  Derrick Pavlov, MD PhD [facility] Endocrinology

## 2023-06-03 NOTE — Patient Instructions (Addendum)
Please continue: - Metformin ER 1500 mg at night - Glipizide 2.5 mg as needed before a larger meal - Farxiga 10 mg before b'fast  Increase: - Rybelsus 14 mg before b'fast  Please return in 4-6 months with your sugar log.

## 2023-06-04 ENCOUNTER — Ambulatory Visit (HOSPITAL_COMMUNITY)
Admission: RE | Admit: 2023-06-04 | Discharge: 2023-06-04 | Disposition: A | Discharge status: Home / Self Care | Payer: Commercial Managed Care - PPO | Source: Ambulatory Visit | Attending: Cardiovascular Disease | Admitting: Cardiovascular Disease

## 2023-06-04 DIAGNOSIS — E119 Type 2 diabetes mellitus without complications: Secondary | ICD-10-CM | POA: Insufficient documentation

## 2023-06-04 DIAGNOSIS — Z8249 Family history of ischemic heart disease and other diseases of the circulatory system: Secondary | ICD-10-CM | POA: Insufficient documentation

## 2023-06-04 DIAGNOSIS — I340 Nonrheumatic mitral (valve) insufficiency: Secondary | ICD-10-CM | POA: Insufficient documentation

## 2023-06-04 DIAGNOSIS — Z136 Encounter for screening for cardiovascular disorders: Secondary | ICD-10-CM | POA: Insufficient documentation

## 2023-06-07 ENCOUNTER — Encounter: Payer: Self-pay | Admitting: Family Medicine

## 2023-06-23 ENCOUNTER — Other Ambulatory Visit: Payer: Self-pay | Admitting: Family Medicine

## 2023-06-23 DIAGNOSIS — G4700 Insomnia, unspecified: Secondary | ICD-10-CM

## 2023-06-28 ENCOUNTER — Encounter: Payer: Self-pay | Admitting: Family Medicine

## 2023-07-05 ENCOUNTER — Ambulatory Visit (INDEPENDENT_AMBULATORY_CARE_PROVIDER_SITE_OTHER): Payer: Commercial Managed Care - PPO | Admitting: Family Medicine

## 2023-07-05 ENCOUNTER — Encounter: Payer: Self-pay | Admitting: Family Medicine

## 2023-07-05 VITALS — BP 124/62 | HR 70 | Temp 98.3°F | Ht 73.5 in | Wt 229.6 lb

## 2023-07-05 DIAGNOSIS — Z23 Encounter for immunization: Secondary | ICD-10-CM | POA: Diagnosis not present

## 2023-07-05 DIAGNOSIS — Z125 Encounter for screening for malignant neoplasm of prostate: Secondary | ICD-10-CM | POA: Diagnosis not present

## 2023-07-05 DIAGNOSIS — E119 Type 2 diabetes mellitus without complications: Secondary | ICD-10-CM | POA: Diagnosis not present

## 2023-07-05 DIAGNOSIS — I10 Essential (primary) hypertension: Secondary | ICD-10-CM

## 2023-07-05 DIAGNOSIS — Z0000 Encounter for general adult medical examination without abnormal findings: Secondary | ICD-10-CM

## 2023-07-05 LAB — MICROALBUMIN / CREATININE URINE RATIO
Creatinine,U: 83 mg/dL
Microalb Creat Ratio: 3.1 mg/g (ref 0.0–30.0)
Microalb, Ur: 2.6 mg/dL — ABNORMAL HIGH (ref 0.0–1.9)

## 2023-07-05 LAB — PSA: PSA: 0.84 ng/mL (ref 0.10–4.00)

## 2023-07-05 MED ORDER — LOSARTAN POTASSIUM 100 MG PO TABS
100.0000 mg | ORAL_TABLET | Freq: Every day | ORAL | 3 refills | Status: DC
Start: 2023-07-05 — End: 2024-07-03

## 2023-07-05 MED ORDER — AMLODIPINE BESYLATE 5 MG PO TABS
5.0000 mg | ORAL_TABLET | Freq: Every day | ORAL | 3 refills | Status: DC
Start: 2023-07-05 — End: 2024-06-05

## 2023-07-05 NOTE — Patient Instructions (Signed)
Health Maintenance, Male Adopting a healthy lifestyle and getting preventive care are important in promoting health and wellness. Ask your health care provider about: The right schedule for you to have regular tests and exams. Things you can do on your own to prevent diseases and keep yourself healthy. What should I know about diet, weight, and exercise? Eat a healthy diet  Eat a diet that includes plenty of vegetables, fruits, low-fat dairy products, and lean protein. Do not eat a lot of foods that are high in solid fats, added sugars, or sodium. Maintain a healthy weight Body mass index (BMI) is a measurement that can be used to identify possible weight problems. It estimates body fat based on height and weight. Your health care provider can help determine your BMI and help you achieve or maintain a healthy weight. Get regular exercise Get regular exercise. This is one of the most important things you can do for your health. Most adults should: Exercise for at least 150 minutes each week. The exercise should increase your heart rate and make you sweat (moderate-intensity exercise). Do strengthening exercises at least twice a week. This is in addition to the moderate-intensity exercise. Spend less time sitting. Even light physical activity can be beneficial. Watch cholesterol and blood lipids Have your blood tested for lipids and cholesterol at 70 years of age, then have this test every 5 years. You may need to have your cholesterol levels checked more often if: Your lipid or cholesterol levels are high. You are older than 70 years of age. You are at high risk for heart disease. What should I know about cancer screening? Many types of cancers can be detected early and may often be prevented. Depending on your health history and family history, you may need to have cancer screening at various ages. This may include screening for: Colorectal cancer. Prostate cancer. Skin cancer. Lung  cancer. What should I know about heart disease, diabetes, and high blood pressure? Blood pressure and heart disease High blood pressure causes heart disease and increases the risk of stroke. This is more likely to develop in people who have high blood pressure readings or are overweight. Talk with your health care provider about your target blood pressure readings. Have your blood pressure checked: Every 3-5 years if you are 18-39 years of age. Every year if you are 40 years old or older. If you are between the ages of 65 and 75 and are a current or former smoker, ask your health care provider if you should have a one-time screening for abdominal aortic aneurysm (AAA). Diabetes Have regular diabetes screenings. This checks your fasting blood sugar level. Have the screening done: Once every three years after age 45 if you are at a normal weight and have a low risk for diabetes. More often and at a younger age if you are overweight or have a high risk for diabetes. What should I know about preventing infection? Hepatitis B If you have a higher risk for hepatitis B, you should be screened for this virus. Talk with your health care provider to find out if you are at risk for hepatitis B infection. Hepatitis C Blood testing is recommended for: Everyone born from 1945 through 1965. Anyone with known risk factors for hepatitis C. Sexually transmitted infections (STIs) You should be screened each year for STIs, including gonorrhea and chlamydia, if: You are sexually active and are younger than 70 years of age. You are older than 70 years of age and your   health care provider tells you that you are at risk for this type of infection. Your sexual activity has changed since you were last screened, and you are at increased risk for chlamydia or gonorrhea. Ask your health care provider if you are at risk. Ask your health care provider about whether you are at high risk for HIV. Your health care provider  may recommend a prescription medicine to help prevent HIV infection. If you choose to take medicine to prevent HIV, you should first get tested for HIV. You should then be tested every 3 months for as long as you are taking the medicine. Follow these instructions at home: Alcohol use Do not drink alcohol if your health care provider tells you not to drink. If you drink alcohol: Limit how much you have to 0-2 drinks a day. Know how much alcohol is in your drink. In the [place] one drink equals one 12 oz bottle of beer (355 mL), one 5 oz glass of wine (148 mL), or one 1 oz glass of hard liquor (44 mL). Lifestyle Do not use any products that contain nicotine or tobacco. These products include cigarettes, chewing tobacco, and vaping devices, such as e-cigarettes. If you need help quitting, ask your health care provider. Do not use street drugs. Do not share needles. Ask your health care provider for help if you need support or information about quitting drugs. General instructions Schedule regular health, dental, and eye exams. Stay current with your vaccines. Tell your health care provider if: You often feel depressed. You have ever been abused or do not feel safe at home. Summary Adopting a healthy lifestyle and getting preventive care are important in promoting health and wellness. Follow your health care provider's instructions about healthy diet, exercising, and getting tested or screened for diseases. Follow your health care provider's instructions on monitoring your cholesterol and blood pressure. This information is not intended to replace advice given to you by your health care provider. Make sure you discuss any questions you have with your health care provider. Document Revised: 02/24/2021 Document Reviewed: 02/24/2021 Elsevier Patient Education  2024 [employer]  

## 2023-07-05 NOTE — Progress Notes (Signed)
Complete physical exam  Patient: Derrick Brown   DOB: 04/16/1951   72 y.o. Male  MRN: [id number]  Subjective:    Chief Complaint  Patient presents with   Annual Exam    Derrick Brown is a 72 y.o. male who presents today for a complete physical exam. He reports consuming a general diet. Home exercise routine includes walking daily. He generally feels well. He reports sleeping tosses and turns about 2-3/7 nights. He does not have additional problems to discuss today.    Most recent fall risk assessment:    07/05/2023    8:00 AM  Fall Risk   Falls in the past year? 0  Number falls in past yr: 0  Injury with Fall? 0  Risk for fall due to : No Fall Risks  Follow up Falls evaluation completed     Most recent depression screenings:    07/05/2023    8:02 AM 03/12/2023    8:13 AM  PHQ 2/9 Scores  PHQ - 2 Score 1 0  PHQ- 9 Score 3 7    Vision:Within last year and Dr. Lovie Macadamia-- had diabetic eye exam, records requested and Dental: No current dental problems and Receives regular dental care  Patient Active Problem List   Diagnosis Date Noted   Nonrheumatic mitral valve regurgitation 03/12/2023   Class 1 obesity 11/28/2019   Cardiac murmur 07/04/2018   Overweight (BMI 25.0-29.9) 05/26/2017   Nuclear age-related cataract, both eyes 02/08/2017   Dysplastic nevus of left lower extremity 08/06/2014   Tinnitus, bilateral 10/24/2013   Sensorineural hearing loss (SNHL), bilateral 10/24/2013   Personal history of colonic adenoma 11/03/2012   Diabetes mellitus without complication (HCC) 07/07/2012   Actinic keratosis 04/15/2011   Low testosterone 04/10/2011   ED (erectile dysfunction) of organic origin 03/26/2011   INSOMNIA 04/02/2010   TEMPOROMANDIBULAR JOINT DISORDER 06/10/2009   DYSPHAGIA 05/15/2009   ESOPHAGITIS, REFLUX 05/13/2009   COUGH DUE TO ACE INHIBITORS 04/12/2009   Hyperlipidemia 02/21/2009   ACNE ROSACEA 02/21/2009   Essential hypertension 02/21/2009      Patient Care  Team: Karie Georges, MD as PCP - General (Family Medicine) Nahser, Deloris Ping, MD as PCP - Cardiology (Cardiology) Dermatology, Clare Gandy, MD as Consulting Physician (Ophthalmology)   Outpatient Medications Prior to Visit  Medication Sig   aspirin 81 MG tablet Take 81 mg by mouth daily.   betamethasone dipropionate 0.05 % cream APPLY TO AFFECTED AREA TWICE A DAY   dapagliflozin propanediol (FARXIGA) 10 MG TABS tablet TAKE 1 TABLET BY MOUTH EVERY DAY BEFORE BREAKFAST   fenofibrate (TRICOR) 145 MG tablet Take 1 tablet (145 mg total) by mouth daily.   glipiZIDE (GLUCOTROL) 5 MG tablet Take 2.5 mg as needed before a larger meal   metFORMIN (GLUCOPHAGE-XR) 750 MG 24 hr tablet Take 1 tablet (750 mg total) by mouth 2 (two) times daily.   Multiple Vitamin (MULTIVITAMIN) tablet Take 1 tablet by mouth daily.   Omega-3 Fatty Acids (FISH OIL) 1000 MG CAPS Take 2,000 mg by mouth 2 (two) times daily.     OVER THE COUNTER MEDICATION Vitamin C 500 mg, one tablet daily.   OVER THE COUNTER MEDICATION Cinnamon 1000 mg one tablet daily.   OVER THE COUNTER MEDICATION CQ10 one tablet daily.   pantoprazole (PROTONIX) 40 MG tablet TAKE 1 TABLET BY MOUTH EVERY DAY   rosuvastatin (CRESTOR) 20 MG tablet Take 1 tablet (20 mg total) by mouth daily.   Saw Palmetto 450 MG CAPS  Take by mouth daily.   Semaglutide (RYBELSUS) 14 MG TABS Take 1 tablet (14 mg total) by mouth daily before breakfast.   traZODone (DESYREL) 100 MG tablet TAKE 1 TABLET BY MOUTH EVERYDAY AT BEDTIME   triamcinolone cream (KENALOG) 0.1 % Apply topically 2 (two) times daily as needed.   [DISCONTINUED] amLODipine (NORVASC) 5 MG tablet Take 1 tablet (5 mg total) by mouth daily.   [DISCONTINUED] losartan (COZAAR) 100 MG tablet Take 1 tablet (100 mg total) by mouth daily.   No facility-administered medications prior to visit.    Review of Systems  HENT:  Negative for hearing loss.   Eyes:  Negative for blurred vision.   Respiratory:  Negative for shortness of breath.   Cardiovascular:  Negative for chest pain.  Gastrointestinal: Negative.   Genitourinary: Negative.   Musculoskeletal:  Negative for back pain.  Neurological:  Negative for headaches.  Psychiatric/Behavioral:  Negative for depression.        Objective:     BP 124/62 (BP Location: Left Arm, Patient Position: Sitting, Cuff Size: Large)   Pulse 70   Temp 98.3 F (36.8 C) (Oral)   Ht 6' 1.5" (1.867 m)   Wt 229 lb 9.6 oz (104.1 kg)   SpO2 97%   BMI 29.88 kg/m    Physical Exam Vitals reviewed.  Constitutional:      Appearance: Normal appearance. He is well-groomed and normal weight.  HENT:     Right Ear: Tympanic membrane and ear canal normal.     Left Ear: Tympanic membrane and ear canal normal.     Mouth/Throat:     Mouth: Mucous membranes are moist.     Pharynx: No posterior oropharyngeal erythema.  Eyes:     Extraocular Movements: Extraocular movements intact.     Conjunctiva/sclera: Conjunctivae normal.  Neck:     Thyroid: No thyromegaly.  Cardiovascular:     Rate and Rhythm: Normal rate and regular rhythm.     Heart sounds: S1 normal and S2 normal. No murmur heard. Pulmonary:     Effort: Pulmonary effort is normal.     Breath sounds: Normal breath sounds and air entry. No rales.  Abdominal:     General: Abdomen is flat. Bowel sounds are normal.  Musculoskeletal:     Right lower leg: No edema.     Left lower leg: No edema.  Lymphadenopathy:     Cervical: No cervical adenopathy.  Neurological:     General: No focal deficit present.     Mental Status: He is alert and oriented to person, place, and time.     Gait: Gait is intact.  Psychiatric:        Mood and Affect: Mood and affect normal.      No results found for any visits on 07/05/23.     Assessment & Plan:    Routine Health Maintenance and Physical Exam  Immunization History  Administered Date(s) Administered   Fluad Quad(high Dose 65+)  07/02/2021, 07/03/2022   Influenza Split 07/07/2012   Influenza, High Dose Seasonal PF 07/04/2018   Influenza,inj,Quad PF,6+ Mos 06/29/2013, 07/20/2014, 07/26/2015, 07/28/2016, 06/30/2017   Influenza-Unspecified 06/20/2019, 07/12/2020   PFIZER(Purple Top)SARS-COV-2 Vaccination 11/23/2019, 12/19/2019, 07/15/2020   Pneumococcal Conjugate-13 08/06/2014, 06/30/2017   Pneumococcal Polysaccharide-23 07/04/2018   Rsv, Bivalent, Protein Subunit Rsvpref,pf Verdis Frederickson) 01/22/2023   Td 12/10/2005   Tdap 08/04/2016   Zoster Recombinant(Shingrix) 07/12/2020, 11/08/2020   Zoster, Live 10/24/2013    Health Maintenance  Topic Date Due   OPHTHALMOLOGY EXAM  04/03/2023   INFLUENZA VACCINE  05/20/2023   COVID-19 Vaccine (4 - 2023-24 season) 06/20/2023   Diabetic kidney evaluation - Urine ACR  07/04/2023   HEMOGLOBIN A1C  12/04/2023   Diabetic kidney evaluation - eGFR measurement  05/20/2024   FOOT EXAM  06/02/2024   DTaP/Tdap/Td (3 - Td or Tdap) 08/04/2026   Colonoscopy  04/22/2028   Pneumonia Vaccine 51+ Years old  Completed   Hepatitis C Screening  Completed   Zoster Vaccines- Shingrix  Completed   HPV VACCINES  Aged Out    Discussed health benefits of physical activity, and encouraged him to engage in regular exercise appropriate for his age and condition.  Prostate cancer screening -     PSA  Routine general medical examination at a health care facility  Diabetes mellitus without complication (HCC) -     Microalbumin / creatinine urine ratio  Essential hypertension -     amLODIPine Besylate; Take 1 tablet (5 mg total) by mouth daily.  Dispense: 90 tablet; Refill: 3 -     Losartan Potassium; Take 1 tablet (100 mg total) by mouth daily.  Dispense: 90 tablet; Refill: 3  Normal physical exam findings today, I reviewed his CT calcium score, previous blood work and his A1C which is well controlled. Handouts given on healthy eating and exercise. He is due for his PSA and also his urine  microalbumin today, will request his eye exam from Dr. Lovie Macadamia. RTC 1 year for annual physicals.   Return in 1 year (on 07/04/2024).     Karie Georges, MD

## 2023-09-06 ENCOUNTER — Encounter: Payer: Self-pay | Admitting: Family Medicine

## 2023-09-06 NOTE — Telephone Encounter (Signed)
 Care team updated and letter sent for eye exam notes.

## 2023-09-14 ENCOUNTER — Ambulatory Visit: Payer: Commercial Managed Care - PPO | Attending: Cardiovascular Disease

## 2023-09-14 DIAGNOSIS — E782 Mixed hyperlipidemia: Secondary | ICD-10-CM

## 2023-09-14 DIAGNOSIS — Z79899 Other long term (current) drug therapy: Secondary | ICD-10-CM

## 2023-09-14 LAB — LIPID PANEL
Chol/HDL Ratio: 4.1 {ratio} (ref 0.0–5.0)
Cholesterol, Total: 106 mg/dL (ref 100–199)
HDL: 26 mg/dL — ABNORMAL LOW (ref >39)
LDL Chol Calc (NIH): 46 mg/dL (ref 0–99)
Triglycerides: 208 mg/dL — ABNORMAL HIGH (ref 0–149)
VLDL Cholesterol Cal: 34 mg/dL (ref 5–40)

## 2023-09-14 LAB — ALT: ALT: 29 [IU]/L (ref 0–44)

## 2023-09-26 ENCOUNTER — Other Ambulatory Visit: Payer: Self-pay | Admitting: Internal Medicine

## 2023-11-13 ENCOUNTER — Other Ambulatory Visit: Payer: Self-pay | Admitting: Internal Medicine

## 2023-12-08 ENCOUNTER — Encounter: Payer: Self-pay | Admitting: Internal Medicine

## 2023-12-09 ENCOUNTER — Telehealth: Payer: Commercial Managed Care - PPO | Admitting: Internal Medicine

## 2023-12-09 ENCOUNTER — Encounter: Payer: Self-pay | Admitting: Internal Medicine

## 2023-12-09 VITALS — BP 140/75 | HR 79 | Ht 73.5 in | Wt 230.0 lb

## 2023-12-09 DIAGNOSIS — Z7984 Long term (current) use of oral hypoglycemic drugs: Secondary | ICD-10-CM | POA: Diagnosis not present

## 2023-12-09 DIAGNOSIS — E66811 Obesity, class 1: Secondary | ICD-10-CM | POA: Diagnosis not present

## 2023-12-09 DIAGNOSIS — E1165 Type 2 diabetes mellitus with hyperglycemia: Secondary | ICD-10-CM | POA: Diagnosis not present

## 2023-12-09 DIAGNOSIS — Z6829 Body mass index (BMI) 29.0-29.9, adult: Secondary | ICD-10-CM

## 2023-12-09 DIAGNOSIS — E785 Hyperlipidemia, unspecified: Secondary | ICD-10-CM

## 2023-12-09 MED ORDER — DAPAGLIFLOZIN PROPANEDIOL 10 MG PO TABS
ORAL_TABLET | ORAL | 3 refills | Status: AC
Start: 2023-12-09 — End: ?

## 2023-12-09 NOTE — Progress Notes (Signed)
Patient ID: Derrick Brown, male   DOB: [birth year], 73 y.o.   MRN: [id number]   Patient location: Home My location: Office Persons participating in the virtual visit: patient, provider  Referring Provider: Karie Georges, MD  I connected with the patient on 12/09/23 at 7:49 AM EST by a video enabled telemedicine application and verified that I am speaking with the correct person.   I discussed the limitations of evaluation and management by telemedicine and the availability of in person appointments. The patient expressed understanding and agreed to proceed.   Details of the encounter are shown below.  HPI: Derrick Brown is a 73 y.o.-year-old male, returning for follow-up for DM2, dx in ~2007, non-insulin-dependent, now controlled, without long term complications. Last visit 6 months ago.  Interim history: No increased urination, blurry vision, nausea, chest pain. He had congestion, URI last mo >> was on ABx  Reviewed HbA1c levels: Lab Results  Component Value Date   HGBA1C 6.5 (A) 06/03/2023   HGBA1C 6.3 (A) 11/26/2022   HGBA1C 6.1 (A) 07/15/2022   Pt is on a regimen of: - Metformin ER 750 mg twice a day >> 1500 (750 x2) mg at dinnertime - Rybelsus 7 >> 14 mg before breakfast - Farxiga 10 mg before breakfast - Glipizide 5 mg as needed before a larger meal >> 2.5 mg QOD >> with larger meals only - 2-3x a week In 02/2019 we tried Rybelsus but we had to stop due to GI side effects and sugars were also more fluctuating.  We restarted Januvia afterwards. Previously on Invokana, then [place]. Previously on Januvia.  Pt checks his sugars twice a day per review of his log: - am:  88, 104-139, 146, 164 >> 94-148, 166 >> 91-143 - 2h after b'fast: n/c >> 198, 200 >> n/c - before lunch: n/c - 2h after lunch: n/c - before dinner: n/c - 2h after dinner: n/c - bedtime: 86, 97-154, 168, 176 >> 71, 84-188, 194 >> 68, 77, 109-145 - nighttime: n/c Lowest sugar was 61 >> 88 >> 68. Highest  sugar was 228 >> .Marland Kitchen. 176 >> 145  Glucometer: Freestyle Lite  Pt's meals are: - Breakfast: oatmeal + blueberries + pecan; cereals; fruit juice; coffee - Lunch: chicken salad or sandwich - late - Dinner: cereal or light dinner - late >> eating out more - Snacks: takes snack boxes to work: yoghurt, veggies, grapes/prunes, chips/cheetos No formal exercise.  No CKD, last BUN/creatinine:  Lab Results  Component Value Date   BUN 12 05/21/2023   BUN 17 07/03/2022   CREATININE 0.98 05/21/2023   CREATININE 0.87 07/03/2022  On losartan.  No MAU: Lab Results  Component Value Date   MICRALBCREAT 3.1 07/05/2023   MICRALBCREAT 3.2 07/03/2022   MICRALBCREAT 1.3 07/02/2021   MICRALBCREAT 1.3 02/23/2020   MICRALBCREAT 1.3 11/30/2018   MICRALBCREAT 1.4 07/04/2018   MICRALBCREAT 1.2 06/30/2017   MICRALBCREAT 1.5 07/28/2016   MICRALBCREAT 1.0 10/18/2014   MICRALBCREAT 1.1 04/23/2014   + HL; last set of lipids: Lab Results  Component Value Date   CHOL 106 09/14/2023   CHOL 186 05/21/2023   CHOL 152 07/03/2022   Lab Results  Component Value Date   HDL 26 (L) 09/14/2023   HDL 30 (L) 05/21/2023   HDL 25.40 (L) 07/03/2022   Lab Results  Component Value Date   LDLCALC 46 09/14/2023   LDLCALC 121 (H) 05/21/2023   LDLCALC  07/15/2020     Comment:     .  LDL cholesterol not calculated. Triglyceride levels greater than 400 mg/dL invalidate calculated LDL results. . Reference range: <100 . Desirable range <100 mg/dL for primary prevention;   <70 mg/dL for patients with CHD or diabetic patients  with > or = 2 CHD risk factors. Marland Kitchen LDL-C is now calculated using the Martin-Hopkins  calculation, which is a validated novel method providing  better accuracy than the Friedewald equation in the  estimation of LDL-C.  Horald Pollen et al. Lenox Ahr. 1610;960(45): 2061-2068  (http://education.[employer].com/faq/FAQ164)    Lab Results  Component Value Date   TRIG 208 (H) 09/14/2023   TRIG  196 (H) 05/21/2023   TRIG 351.0 (H) 07/03/2022   Lab Results  Component Value Date   CHOLHDL 4.1 09/14/2023   CHOLHDL 6.2 (H) 05/21/2023   CHOLHDL 6 07/03/2022   Lab Results  Component Value Date   LDLDIRECT 114.0 07/03/2022   LDLDIRECT 89.0 07/02/2021   LDLDIRECT 83.0 02/23/2020  Previously on Lipitor 20 every other day but changed to Crestor 20 mg daily in 05/2023.  He is also on fish oil 2000 mg twice a day. Fenofibrate 48 mg daily.  Vascepa was not covered by his insurance.  - last eye exam was: 04/2023: No DR, +  cataracts.  Switched from Dr. Nile Riggs (who retired) to Dr. [place].  - no numbness and tingling in his feet.  Foot exam 06/03/2023.  She also has HTN, GERD.  ROS: + see HPI  I reviewed pt's medications, allergies, PMH, social hx, family hx, and changes were documented in the history of present illness. Otherwise, unchanged from my initial visit note.  Past Medical History:  Diagnosis Date   ACNE ROSACEA 02/21/2009   Allergy    COUGH DUE TO ACE INHIBITORS 04/12/2009   Diabetes mellitus without complication (HCC)    type 2   DYSPHAGIA 05/15/2009   ESOPHAGITIS, REFLUX 05/13/2009   FATIGUE 03/14/2010   Fatty liver    GERD (gastroesophageal reflux disease)    HYPERGLYCEMIA 02/21/2009   HYPERLIPIDEMIA 02/21/2009   Hypertension    HYPERTENSION NEC 02/21/2009   INSOMNIA 04/02/2010   MUSCLE PAIN 05/13/2009   OBSTRUCTIVE SLEEP APNEA 04/02/2010   RENAL CALCULUS 02/21/2009   Sleep apnea    TEMPOROMANDIBULAR JOINT DISORDER 06/10/2009   Unspecified hypertrophic and atrophic condition of skin    Past Surgical History:  Procedure Laterality Date   COLONOSCOPY     ESOPHAGOGASTRODUODENOSCOPY     KIDNEY STONE SURGERY     removal   TONSILLECTOMY AND ADENOIDECTOMY     Social History   Social History   Marital status: Married    Spouse name: N/A   Social History Main Topics   Smoking status: Never Smoker   Smokeless tobacco: Never Used   Alcohol use 1.7 oz/week     1 burbon   Drug use: No   Current Outpatient Medications on File Prior to Visit  Medication Sig Dispense Refill   amLODipine (NORVASC) 5 MG tablet Take 1 tablet (5 mg total) by mouth daily. 90 tablet 3   aspirin 81 MG tablet Take 81 mg by mouth daily.     betamethasone dipropionate 0.05 % cream APPLY TO AFFECTED AREA TWICE A DAY 30 g 1   dapagliflozin propanediol (FARXIGA) 10 MG TABS tablet TAKE 1 TABLET BY MOUTH EVERY DAY BEFORE BREAKFAST 90 tablet 3   fenofibrate (TRICOR) 145 MG tablet TAKE 1 TABLET BY MOUTH EVERY DAY 90 tablet 3   glipiZIDE (GLUCOTROL) 5 MG tablet TAKE 1/2 TABLET AS NEEDED  BEFORE A LARGER MEAL 45 tablet 3   losartan (COZAAR) 100 MG tablet Take 1 tablet (100 mg total) by mouth daily. 90 tablet 3   metFORMIN (GLUCOPHAGE-XR) 750 MG 24 hr tablet TAKE 1 TABLET BY MOUTH 2 TIMES DAILY. 180 tablet 3   Multiple Vitamin (MULTIVITAMIN) tablet Take 1 tablet by mouth daily.     Omega-3 Fatty Acids (FISH OIL) 1000 MG CAPS Take 2,000 mg by mouth 2 (two) times daily.       OVER THE COUNTER MEDICATION Vitamin C 500 mg, one tablet daily.     OVER THE COUNTER MEDICATION Cinnamon 1000 mg one tablet daily.     OVER THE COUNTER MEDICATION CQ10 one tablet daily.     pantoprazole (PROTONIX) 40 MG tablet TAKE 1 TABLET BY MOUTH EVERY DAY 90 tablet 0   rosuvastatin (CRESTOR) 20 MG tablet Take 1 tablet (20 mg total) by mouth daily. 90 tablet 3   Saw Palmetto 450 MG CAPS Take by mouth daily.     Semaglutide (RYBELSUS) 14 MG TABS Take 1 tablet (14 mg total) by mouth daily before breakfast. 90 tablet 3   traZODone (DESYREL) 100 MG tablet TAKE 1 TABLET BY MOUTH EVERYDAY AT BEDTIME 90 tablet 3   triamcinolone cream (KENALOG) 0.1 % Apply topically 2 (two) times daily as needed. 30 g 0   No current facility-administered medications on file prior to visit.   Allergies  Allergen Reactions   Hctz [Hydrochlorothiazide] Hives, Itching and Rash   Family History  Problem Relation Age of Onset   Stroke  Mother 22   Rectal cancer Sister    Heart attack Father 22   Healthy Brother    Colon polyps Sister    Heart disease Other        family histrory   Diabetes Paternal Grandmother    Colon cancer Neg Hx    Esophageal cancer Neg Hx    Liver cancer Neg Hx    Pancreatic cancer Neg Hx    Stomach cancer Neg Hx    PE: There were no vitals taken for this visit. At home weight: 223-224 lbs, BP 140/79, O2 Sat 98%, P 79 Wt Readings from Last 3 Encounters:  07/05/23 229 lb 9.6 oz (104.1 kg)  06/03/23 234 lb 3.2 oz (106.2 kg)  05/21/23 232 lb (105.2 kg)   Constitutional:  in NAD  The physical exam was not performed (virtual visit).  ASSESSMENT: 1. DM2, non-insulin-dependent, uncontrolled, without long term complications, but with hyperglycemia  2. Obesity class 1  3. HL  PLAN:  1. Patient with longstanding, previously uncontrolled type 2 diabetes, with improved control after adding an SGLT2 inhibitor.  At last visit, sugars were higher in the morning with also occasionally slightly higher CBGs after dinner.  He did not feel that he could change the timing or the consistency of his dizziness.  I was reticent to increase his glipizide since he did have some spontaneous blood sugars in the 70s or 80s occasionally after dinner even without glipizide.  Therefore, we discussed about increasing Rybelsus to 14 mg in the morning.  HbA1c at that time was slightly higher, at 6.5%. -At today's visit, the majority of his blood sugars are at goal, with occasional hyperglycemic values in the morning, but these are mild and sparse.  This could be related to his upper respiratory infection.  Later in the day, he does not check blood sugars until bedtime, and these blood sugars also appear to be at goal with  an occasional mild low, lowest being 68.  We discussed about only using glipizide with larger meals or if she had dessert after the meal but otherwise I did not feel he needed a change in his regimen.  I  refilled his Marcelline Deist. - I suggested to:  Patient Instructions  Please continue: - Metformin ER 1500 mg at night - Glipizide 2.5 mg as needed before a larger meal - Farxiga 10 mg before b'fast - Rybelsus 14 mg before b'fast  Please return in 4-6 months with your sugar log.   - we will check another HbA1c at next visit - advised to check sugars at different times of the day - 1x a day, rotating check times - advised for yearly eye exams >> he is UTD - return to clinic in 4-6 months   2. Obesity class 1 -continue SGLT 2 inhibitor and GLP-1 receptor agonist which should also help with weight loss -he was previously not able to tolerate Rybelsus due to GI symptoms, but we restarted this and he now tolerates it well.  Retrospectively, previous GI symptoms could have been related to his COVID-19 infection. -Weight was approximately stable at last visit, lost few lbs since last OV  3. HL -Reviewed latest lipid panel from 08/2023: LDL much improved, triglycerides elevated, HDL low: Lab Results  Component Value Date   CHOL 106 09/14/2023   HDL 26 (L) 09/14/2023   LDLCALC 46 09/14/2023   LDLDIRECT 114.0 07/03/2022   TRIG 208 (H) 09/14/2023   CHOLHDL 4.1 09/14/2023  -He is on Crestor 20 mg daily, fish oil 2000 mg twice a day and fenofibrate 145 mg daily.  He tolerates these well.  Carlus Pavlov, MD PhD [facility]

## 2023-12-09 NOTE — Patient Instructions (Signed)
Please continue: - Metformin ER 1500 mg at night - Glipizide 2.5 mg as needed before a larger meal - Farxiga 10 mg before b'fast - Rybelsus 14 mg before b'fast  Please return in 4-6 months with your sugar log.

## 2024-04-25 ENCOUNTER — Encounter: Payer: Self-pay | Admitting: Family Medicine

## 2024-04-26 ENCOUNTER — Telehealth: Payer: Self-pay | Admitting: Cardiology

## 2024-04-26 DIAGNOSIS — E782 Mixed hyperlipidemia: Secondary | ICD-10-CM

## 2024-04-26 DIAGNOSIS — Z79899 Other long term (current) drug therapy: Secondary | ICD-10-CM

## 2024-04-26 MED ORDER — ROSUVASTATIN CALCIUM 20 MG PO TABS
20.0000 mg | ORAL_TABLET | Freq: Every day | ORAL | 0 refills | Status: DC
Start: 2024-04-26 — End: 2024-07-06

## 2024-04-26 NOTE — Telephone Encounter (Signed)
*  STAT* If patient is at the pharmacy, call can be transferred to refill team.   1. Which medications need to be refilled? (please list name of each medication and dose if known) rosuvastatin  (CRESTOR ) 20 MG tablet   2. Which pharmacy/location (including street and city if local pharmacy) is medication to be sent to? [id number] - [place], [place] - [address] [phone]   3. Do they need a 30 day or 90 day supply? 90

## 2024-04-26 NOTE — Telephone Encounter (Signed)
 Pt's medication was sent to pt's pharmacy as requested. Confirmation received.

## 2024-04-26 NOTE — Telephone Encounter (Signed)
 Look like Dr. Maude refilled it for him today, ok to close

## 2024-05-08 ENCOUNTER — Other Ambulatory Visit: Payer: Self-pay | Admitting: Internal Medicine

## 2024-05-08 ENCOUNTER — Other Ambulatory Visit: Payer: Self-pay | Admitting: Family Medicine

## 2024-05-08 DIAGNOSIS — G4700 Insomnia, unspecified: Secondary | ICD-10-CM

## 2024-05-08 NOTE — Telephone Encounter (Signed)
 Refill request complete

## 2024-05-16 ENCOUNTER — Other Ambulatory Visit: Payer: Self-pay | Admitting: Cardiology

## 2024-05-16 DIAGNOSIS — Z79899 Other long term (current) drug therapy: Secondary | ICD-10-CM

## 2024-05-16 DIAGNOSIS — E782 Mixed hyperlipidemia: Secondary | ICD-10-CM

## 2024-06-05 ENCOUNTER — Other Ambulatory Visit: Payer: Self-pay | Admitting: Family Medicine

## 2024-06-05 DIAGNOSIS — I10 Essential (primary) hypertension: Secondary | ICD-10-CM

## 2024-06-12 NOTE — Progress Notes (Unsigned)
 Cardiology Office Note:    Date:  06/15/2024   ID:  Derrick Brown, Derrick Brown 12/04/1950, MRN [id number]  PCP:  Ozell Heron HERO, MD   [facility] Providers Cardiologist:  Aleene Passe, MD (Inactive)     Referring MD: Ozell Heron HERO, MD   Chief Complaint  Patient presents with   Coronary Artery Disease    History of Present Illness:    Derrick Brown is a 73 y.o. male with a hx of CAD, mild MR, HLD, HTN, DM. Former patient of Dr Passe. History of OSA. Prior Echo in 2019 showed normal LV function with mild MR. Coronary calcium  score in August 2024 was 1665 placing him at 92nd percentile.   He is [employer] of [employer] in [place]. Planning to retire. Notes all male family members have died with heart disease. BP has been well controlled. Last A1c 6.6%. LDL 65 recently - was 49 in Nov.   He notes some DOE when walking to [employer] but is able to go quail hunting without any problems. No chest pain or palpitations. Doesn't do any regular aerobic activity.   Past Medical History:  Diagnosis Date   ACNE ROSACEA 02/21/2009   Allergy    COUGH DUE TO ACE INHIBITORS 04/12/2009   Diabetes mellitus without complication (HCC)    type 2   DYSPHAGIA 05/15/2009   ESOPHAGITIS, REFLUX 05/13/2009   FATIGUE 03/14/2010   Fatty liver    GERD (gastroesophageal reflux disease)    HYPERGLYCEMIA 02/21/2009   HYPERLIPIDEMIA 02/21/2009   Hypertension    HYPERTENSION NEC 02/21/2009   INSOMNIA 04/02/2010   MUSCLE PAIN 05/13/2009   OBSTRUCTIVE SLEEP APNEA 04/02/2010   RENAL CALCULUS 02/21/2009   Sleep apnea    TEMPOROMANDIBULAR JOINT DISORDER 06/10/2009   Unspecified hypertrophic and atrophic condition of skin     Past Surgical History:  Procedure Laterality Date   COLONOSCOPY     ESOPHAGOGASTRODUODENOSCOPY     KIDNEY STONE SURGERY     removal   TONSILLECTOMY AND ADENOIDECTOMY      Current Medications: Current Meds  Medication Sig   amLODipine  (NORVASC ) 5 MG tablet TAKE 1  TABLET (5 MG TOTAL) BY MOUTH DAILY.   aspirin 81 MG tablet Take 81 mg by mouth daily.   betamethasone  dipropionate 0.05 % cream APPLY TO AFFECTED AREA TWICE A DAY   dapagliflozin  propanediol (FARXIGA ) 10 MG TABS tablet TAKE 1 TABLET BY MOUTH EVERY DAY BEFORE BREAKFAST   fenofibrate  (TRICOR ) 145 MG tablet TAKE 1 TABLET BY MOUTH EVERY DAY   glipiZIDE  (GLUCOTROL ) 5 MG tablet TAKE 1/2 TABLET AS NEEDED BEFORE A LARGER MEAL   losartan  (COZAAR ) 100 MG tablet Take 1 tablet (100 mg total) by mouth daily.   metFORMIN  (GLUCOPHAGE -XR) 750 MG 24 hr tablet Take 1 tablet (750 mg total) by mouth 2 (two) times daily.   Multiple Vitamin (MULTIVITAMIN) tablet Take 1 tablet by mouth daily.   Omega-3 Fatty Acids (FISH OIL) 1000 MG CAPS Take 2,000 mg by mouth 2 (two) times daily.     OVER THE COUNTER MEDICATION Vitamin C 500 mg, one tablet daily.   OVER THE COUNTER MEDICATION Cinnamon 1000 mg one tablet daily.   OVER THE COUNTER MEDICATION CQ10 one tablet daily.   pantoprazole  (PROTONIX ) 40 MG tablet TAKE 1 TABLET BY MOUTH EVERY DAY   rosuvastatin  (CRESTOR ) 20 MG tablet Take 1 tablet (20 mg total) by mouth daily.   RYBELSUS  14 MG TABS TAKE 1 TABLET (14 MG TOTAL) BY MOUTH  DAILY BEFORE BREAKFAST.   Saw Palmetto 450 MG CAPS Take by mouth daily.   traZODone  (DESYREL ) 100 MG tablet TAKE 1 TABLET BY MOUTH EVERYDAY AT BEDTIME   triamcinolone  cream (KENALOG ) 0.1 % Apply topically 2 (two) times daily as needed.     Allergies:   Hctz [hydrochlorothiazide ]   Social History   Socioeconomic History   Marital status: Married    Spouse name: Not on file   Number of children: 0   Years of education: Not on file   Highest education level: Bachelor's degree (e.g., BA, AB, BS)  Occupational History   Not on file  Tobacco Use   Smoking status: Never   Smokeless tobacco: Never  Vaping Use   Vaping status: Never Used  Substance and Sexual Activity   Alcohol use: Yes    Alcohol/week: 3.0 standard drinks of alcohol     Types: 2 Glasses of wine, 1 Unspecified drink type per week   Drug use: No   Sexual activity: Not on file  Other Topics Concern   Not on file  Social History Narrative   Not on file   Social Drivers of Health   Financial Resource Strain: Low Risk  (07/05/2023)   Overall Financial Resource Strain (CARDIA)    Difficulty of Paying Living Expenses: Not hard at all  Food Insecurity: No Food Insecurity (07/05/2023)   Hunger Vital Sign    Worried About Running Out of Food in the Last Year: Never true    Ran Out of Food in the Last Year: Never true  Transportation Needs: No Transportation Needs (07/05/2023)   PRAPARE - Administrator, Civil Service (Medical): No    Lack of Transportation (Non-Medical): No  Physical Activity: Insufficiently Active (07/05/2023)   Exercise Vital Sign    Days of Exercise per Week: 2 days    Minutes of Exercise per Session: 30 min  Stress: Stress Concern Present (07/05/2023)   [employer] of Occupational Health - Occupational Stress Questionnaire    Feeling of Stress : Very much  Social Connections: Moderately Integrated (07/05/2023)   Social Connection and Isolation Panel    Frequency of Communication with Friends and Family: More than three times a week    Frequency of Social Gatherings with Friends and Family: Not on file    Attends Religious Services: Never    Database administrator or Organizations: Yes    Attends Engineer, structural: More than 4 times per year    Marital Status: Married     Family History: The patient's family history includes Colon polyps in his sister; Diabetes in his paternal grandmother; Healthy in his brother; Heart attack (age of onset: 41) in his father; Heart disease in an other family member; Rectal cancer in his sister; Stroke (age of onset: 19) in his mother. There is no history of Colon cancer, Esophageal cancer, Liver cancer, Pancreatic cancer, or Stomach cancer.  ROS:   Please see the history of  present illness.     All other systems reviewed and are negative.  EKGs/Labs/Other Studies Reviewed:    The following studies were reviewed today:  EKG Interpretation Date/Time:  Thursday June 15 2024 08:32:54 EDT Ventricular Rate:  76 PR Interval:  208 QRS Duration:  100 QT Interval:  406 QTC Calculation: 456 R Axis:   228  Text Interpretation: Normal sinus rhythm Right superior axis deviation When compared with ECG of 21-May-2023 08:55, Right bundle branch block is no longer Present Criteria for  Septal infarct are no longer Present Confirmed by [place], Shayra Anton [phone]) on 06/15/2024 8:38:01 AM   Echo 07/19/18: Study Conclusions   - Left ventricle: The cavity size was normal. There was mild    concentric hypertrophy. Systolic function was normal. The    estimated ejection fraction was in the range of 60% to 65%. Wall    motion was normal; there were no regional wall motion    abnormalities. Doppler parameters are consistent with abnormal    left ventricular relaxation (grade 1 diastolic dysfunction).  - Mitral valve: Calcified annulus. Mildly thickened leaflets .    There was mild regurgitation.   Coronary Calcium  Score   TECHNIQUE: The patient was scanned on a Siemens Somatom 64 slice scanner. Axial non-contrast 3mm slices were carried out through the heart. The data set was analyzed on a dedicated work station and scored using the Agatson method.   FINDINGS: Non-cardiac: No significant non cardiac findings on limited lung and soft tissue windows. See separate report from [facility].   Ascending Aorta: Mildly dilated at 3.8 cm   Pericardium: Normal   Coronary arteries: LM and severe 3 vessel coronary calcium    LM 37.5   LAD 498   RCA 577   LCX 553   Total:  1665   IMPRESSION: Coronary calcium  score of 1665. This was 36 nd percentile for age and sex matched control. Consider f/u   Perfusion study if clinically indicated   Maude Emmer    Electronically Signed: By: Maude Emmer M.D. On: 06/04/2023 08:51   EKG Interpretation Date/Time:  Thursday June 15 2024 08:32:54 EDT Ventricular Rate:  76 PR Interval:  208 QRS Duration:  100 QT Interval:  406 QTC Calculation: 456 R Axis:   228  Text Interpretation: Normal sinus rhythm Right superior axis deviation When compared with ECG of 21-May-2023 08:55, Right bundle branch block is no longer Present Criteria for Septal infarct are no longer Present Confirmed by [place], Rockney Grenz [phone]) on 06/15/2024 8:38:01 AM    Recent Labs: 06/13/2024: ALT 27; BUN 20; Creat 0.86; Potassium 4.1; Sodium 139  Recent Lipid Panel    Component Value Date/Time   CHOL 120 06/13/2024 0817   CHOL 106 09/14/2023 0758   TRIG 212 (H) 06/13/2024 0817   HDL 27 (L) 06/13/2024 0817   HDL 26 (L) 09/14/2023 0758   CHOLHDL 4.4 06/13/2024 0817   VLDL 70.2 (H) 07/03/2022 0836   LDLCALC 65 06/13/2024 0817   LDLDIRECT 114.0 07/03/2022 0836     Risk Assessment/Calculations:                Physical Exam:    VS:  BP 128/72   Pulse 76   Ht 6' 1 (1.854 m)   Wt 196 lb 3.2 oz (89 kg)   SpO2 96%   BMI 25.89 kg/m     Wt Readings from Last 3 Encounters:  06/15/24 196 lb 3.2 oz (89 kg)  06/13/24 228 lb 9.6 oz (103.7 kg)  12/09/23 230 lb (104.3 kg)     GEN:  Well nourished, well developed in no acute distress HEENT: Normal NECK: No JVD; No carotid bruits LYMPHATICS: No lymphadenopathy CARDIAC: RRR, no murmurs, rubs, gallops RESPIRATORY:  Clear to auscultation without rales, wheezing or rhonchi  ABDOMEN: Soft, non-tender, non-distended MUSCULOSKELETAL:  No edema; No deformity  SKIN: Warm and dry NEUROLOGIC:  Alert and oriented x 3 PSYCHIATRIC:  Normal affect   ASSESSMENT:    1. Coronary artery disease of native artery of  native heart with stable angina pectoris (HCC)   2. Mixed hyperlipidemia   3. Nonrheumatic mitral valve regurgitation   4. Essential hypertension   5. Dyspnea on  exertion   6. Diabetes mellitus without complication (HCC)    PLAN:    In order of problems listed above:  CAD with very high calcium  score. Multiple CV risk factors. Some DOE no chest pain. Fairly inactive. Recommend stress Myoview to assess ischemic risk. If not high risk will continue medical therapy and risk factor modification Mixed HLD. On Crestor  20 mg, fenofibrate , and fish oil. Stressed importance of healthy low carb diet and weight control. Needs to increase aerobic activity DM per endocrinology.  HTN well controlled.  Family history of CAD Mild MR      Informed Consent   Shared Decision Making/Informed Consent The risks [chest pain, shortness of breath, cardiac arrhythmias, dizziness, blood pressure fluctuations, myocardial infarction, stroke/transient ischemic attack, nausea, vomiting, allergic reaction, radiation exposure, metallic taste sensation and life-threatening complications (estimated to be 1 in 10,000)], benefits (risk stratification, diagnosing coronary artery disease, treatment guidance) and alternatives of a nuclear stress test were discussed in detail with Derrick Brown and he agrees to proceed.       Medication Adjustments/Labs and Tests Ordered: Current medicines are reviewed at length with the patient today.  Concerns regarding medicines are outlined above.  Orders Placed This Encounter  Procedures   Cardiac Stress Test: Informed Consent Details: Physician/Practitioner Attestation; Transcribe to consent form and obtain patient signature   EKG 12-Lead   No orders of the defined types were placed in this encounter.   There are no Patient Instructions on file for this visit.   Signed, Annica Marinello [place], MD  06/15/2024 8:52 AM    [facility]

## 2024-06-13 ENCOUNTER — Encounter: Payer: Self-pay | Admitting: Internal Medicine

## 2024-06-13 ENCOUNTER — Ambulatory Visit (INDEPENDENT_AMBULATORY_CARE_PROVIDER_SITE_OTHER): Payer: Commercial Managed Care - PPO | Admitting: Internal Medicine

## 2024-06-13 VITALS — BP 120/62 | HR 89 | Ht 73.5 in | Wt 228.6 lb

## 2024-06-13 DIAGNOSIS — E663 Overweight: Secondary | ICD-10-CM | POA: Diagnosis not present

## 2024-06-13 DIAGNOSIS — Z7984 Long term (current) use of oral hypoglycemic drugs: Secondary | ICD-10-CM

## 2024-06-13 DIAGNOSIS — E785 Hyperlipidemia, unspecified: Secondary | ICD-10-CM

## 2024-06-13 DIAGNOSIS — E1165 Type 2 diabetes mellitus with hyperglycemia: Secondary | ICD-10-CM

## 2024-06-13 LAB — POCT GLYCOSYLATED HEMOGLOBIN (HGB A1C): Hemoglobin A1C: 6.6 % — AB (ref 4.0–5.6)

## 2024-06-13 MED ORDER — GLIPIZIDE 5 MG PO TABS: ORAL_TABLET | ORAL | 3 refills | Status: AC

## 2024-06-13 MED ORDER — METFORMIN HCL ER 750 MG PO TB24: 750.0000 mg | ORAL_TABLET | Freq: Two times a day (BID) | ORAL | 3 refills | Status: AC

## 2024-06-13 NOTE — Patient Instructions (Addendum)
 Please continue: - Metformin  ER 1500 mg at night - Glipizide  2.5-5 mg as needed before a larger meal - Farxiga  10 mg before b'fast - Rybelsus  14 mg before b'fast  Please return in 4-6 months with your sugar log.

## 2024-06-13 NOTE — Progress Notes (Signed)
 Patient ID: Derrick Brown, male   DOB: 27-Jul-1951, 73 y.o.   MRN: [id number]   HPI: Derrick Brown is a 73 y.o.-year-old male, returning for follow-up for DM2, dx in ~2007, non-insulin-dependent, now controlled, without long term complications. Last visit 6 months ago (virtual).  Interim history: No increased urination, blurry vision, nausea, chest pain.  Reviewed HbA1c levels: Lab Results  Component Value Date   HGBA1C 6.5 (A) 06/03/2023   HGBA1C 6.3 (A) 11/26/2022   HGBA1C 6.1 (A) 07/15/2022   Pt is on a regimen of: - Metformin  ER 750 mg twice a day >> 1500 (750 x2) mg at dinnertime - Rybelsus  7 >> 14 mg before breakfast - Farxiga  10 mg before breakfast - Glipizide  5 mg as needed before a larger meal >> 2.5 mg QOD >> with larger meals only - 2-3x a week In 02/2019 we tried Rybelsus  but we had to stop due to GI side effects and sugars were also more fluctuating.  We restarted Januvia  afterwards. Previously on Invokana , then Jardiance. Previously on Januvia .  Pt checks his sugars twice a day per review of his log: - am:  94-148, 166 >> 91-143 >> 97-141, 149 (eating out) - 2h after b'fast: n/c >> 198, 200 >> n/c - before lunch: n/c - 2h after lunch: n/c - before dinner: n/c - 2h after dinner: n/c - bedtime: 71, 84-188, 194 >> 68, 77, 109-145 >> 71, 83-160, 174, 218 - nighttime: n/c Lowest sugar was 61 >> 88 >> 68 >> 71. Highest sugar was 228 >> .SABRA. 176 >> 145 >> 218.  Glucometer: Freestyle Lite  Pt's meals are: - Breakfast: oatmeal + blueberries + pecan; cereals; fruit juice; coffee - Lunch: chicken salad or sandwich - late - Dinner: cereal or light dinner - late >> eating out more - Snacks: takes snack boxes to work: yoghurt, veggies, grapes/prunes, chips/cheetos No formal exercise.  No CKD, last BUN/creatinine:  Lab Results  Component Value Date   BUN 12 05/21/2023   BUN 17 07/03/2022   CREATININE 0.98 05/21/2023   CREATININE 0.87 07/03/2022  On losartan .  No  MAU: Lab Results  Component Value Date   MICRALBCREAT 1.1 04/23/2014   MICRALBCREAT 0.9 03/07/2010   MICRALBCREAT 11.5 02/14/2009   + HL; last set of lipids: Lab Results  Component Value Date   CHOL 106 09/14/2023   CHOL 186 05/21/2023   CHOL 152 07/03/2022   Lab Results  Component Value Date   HDL 26 (L) 09/14/2023   HDL 30 (L) 05/21/2023   HDL 25.40 (L) 07/03/2022   Lab Results  Component Value Date   LDLCALC 46 09/14/2023   LDLCALC 121 (H) 05/21/2023   LDLCALC  07/15/2020     Comment:     . LDL cholesterol not calculated. Triglyceride levels greater than 400 mg/dL invalidate calculated LDL results. . Reference range: <100 . Desirable range <100 mg/dL for primary prevention;   <70 mg/dL for patients with CHD or diabetic patients  with > or = 2 CHD risk factors. SABRA LDL-C is now calculated using the Martin-Hopkins  calculation, which is a validated novel method providing  better accuracy than the Friedewald equation in the  estimation of LDL-C.  Gladis APPLETHWAITE et al. SANDREA. 7986;689(80): 2061-2068  (http://education.[facility].com/faq/FAQ164)    Lab Results  Component Value Date   TRIG 208 (H) 09/14/2023   TRIG 196 (H) 05/21/2023   TRIG 351.0 (H) 07/03/2022   Lab Results  Component Value Date   CHOLHDL 4.1 09/14/2023  CHOLHDL 6.2 (H) 05/21/2023   CHOLHDL 6 07/03/2022   Lab Results  Component Value Date   LDLDIRECT 114.0 07/03/2022   LDLDIRECT 89.0 07/02/2021   LDLDIRECT 83.0 02/23/2020  Previously on Lipitor 20 every other day but changed to Crestor  20 mg daily in 05/2023.  He is also on fish oil 2000 mg twice a day. Fenofibrate  48 mg daily.  Vascepa  was not covered by his insurance.  - last eye exam was: 04/2024: No DR reportedly, +  cataracts.  Switched from Dr. Roz (retired) to Dr. [place].  - no numbness and tingling in his feet.  Foot exam 06/03/2023.  She also has HTN, GERD.  ROS: + see HPI  I reviewed pt's medications, allergies, PMH,  social hx, family hx, and changes were documented in the history of present illness. Otherwise, unchanged from my initial visit note.  Past Medical History:  Diagnosis Date   ACNE ROSACEA 02/21/2009   Allergy    COUGH DUE TO ACE INHIBITORS 04/12/2009   Diabetes mellitus without complication (HCC)    type 2   DYSPHAGIA 05/15/2009   ESOPHAGITIS, REFLUX 05/13/2009   FATIGUE 03/14/2010   Fatty liver    GERD (gastroesophageal reflux disease)    HYPERGLYCEMIA 02/21/2009   HYPERLIPIDEMIA 02/21/2009   Hypertension    HYPERTENSION NEC 02/21/2009   INSOMNIA 04/02/2010   MUSCLE PAIN 05/13/2009   OBSTRUCTIVE SLEEP APNEA 04/02/2010   RENAL CALCULUS 02/21/2009   Sleep apnea    TEMPOROMANDIBULAR JOINT DISORDER 06/10/2009   Unspecified hypertrophic and atrophic condition of skin    Past Surgical History:  Procedure Laterality Date   COLONOSCOPY     ESOPHAGOGASTRODUODENOSCOPY     KIDNEY STONE SURGERY     removal   TONSILLECTOMY AND ADENOIDECTOMY     Social History   Social History   Marital status: Married    Spouse name: N/A   Social History Main Topics   Smoking status: Never Smoker   Smokeless tobacco: Never Used   Alcohol use 1.7 oz/week    1 burbon   Drug use: No   Current Outpatient Medications on File Prior to Visit  Medication Sig Dispense Refill   amLODipine  (NORVASC ) 5 MG tablet TAKE 1 TABLET (5 MG TOTAL) BY MOUTH DAILY. 90 tablet 0   aspirin 81 MG tablet Take 81 mg by mouth daily.     betamethasone  dipropionate 0.05 % cream APPLY TO AFFECTED AREA TWICE A DAY 30 g 1   dapagliflozin  propanediol (FARXIGA ) 10 MG TABS tablet TAKE 1 TABLET BY MOUTH EVERY DAY BEFORE BREAKFAST 90 tablet 3   fenofibrate  (TRICOR ) 145 MG tablet TAKE 1 TABLET BY MOUTH EVERY DAY 90 tablet 3   glipiZIDE  (GLUCOTROL ) 5 MG tablet TAKE 1/2 TABLET AS NEEDED BEFORE A LARGER MEAL 45 tablet 3   losartan  (COZAAR ) 100 MG tablet Take 1 tablet (100 mg total) by mouth daily. 90 tablet 3   metFORMIN   (GLUCOPHAGE -XR) 750 MG 24 hr tablet TAKE 1 TABLET BY MOUTH 2 TIMES DAILY. 180 tablet 3   Multiple Vitamin (MULTIVITAMIN) tablet Take 1 tablet by mouth daily.     Omega-3 Fatty Acids (FISH OIL) 1000 MG CAPS Take 2,000 mg by mouth 2 (two) times daily.       OVER THE COUNTER MEDICATION Vitamin C 500 mg, one tablet daily.     OVER THE COUNTER MEDICATION Cinnamon 1000 mg one tablet daily.     OVER THE COUNTER MEDICATION CQ10 one tablet daily.     pantoprazole  (PROTONIX )  40 MG tablet TAKE 1 TABLET BY MOUTH EVERY DAY 90 tablet 0   rosuvastatin  (CRESTOR ) 20 MG tablet Take 1 tablet (20 mg total) by mouth daily. 90 tablet 0   RYBELSUS  14 MG TABS TAKE 1 TABLET (14 MG TOTAL) BY MOUTH DAILY BEFORE BREAKFAST. 90 tablet 3   Saw Palmetto 450 MG CAPS Take by mouth daily.     traZODone  (DESYREL ) 100 MG tablet TAKE 1 TABLET BY MOUTH EVERYDAY AT BEDTIME 90 tablet 0   triamcinolone  cream (KENALOG ) 0.1 % Apply topically 2 (two) times daily as needed. 30 g 0   No current facility-administered medications on file prior to visit.   Allergies  Allergen Reactions   Hctz [Hydrochlorothiazide ] Hives, Itching and Rash   Family History  Problem Relation Age of Onset   Stroke Mother 41   Rectal cancer Sister    Heart attack Father 47   Healthy Brother    Colon polyps Sister    Heart disease Other        family histrory   Diabetes Paternal Grandmother    Colon cancer Neg Hx    Esophageal cancer Neg Hx    Liver cancer Neg Hx    Pancreatic cancer Neg Hx    Stomach cancer Neg Hx    PE: BP 120/62   Pulse 89   Ht 6' 1.5 (1.867 m)   Wt 228 lb 9.6 oz (103.7 kg)   SpO2 97%   BMI 29.75 kg/m  Wt Readings from Last 3 Encounters:  06/13/24 228 lb 9.6 oz (103.7 kg)  12/09/23 230 lb (104.3 kg)  07/05/23 229 lb 9.6 oz (104.1 kg)   Constitutional: overweight, in NAD Eyes:  EOMI, no exophthalmos ENT: no neck masses, no cervical lymphadenopathy Cardiovascular: RRR, No MRG Respiratory: CTA B Musculoskeletal: no  deformities Skin:no rashes Neurological: no tremor with outstretched hands Diabetic Foot Exam - Simple   Simple Foot Form Diabetic Foot exam was performed with the following findings: Yes 06/13/2024  8:07 AM  Visual Inspection No deformities, no ulcerations, no other skin breakdown bilaterally: Yes Sensation Testing Intact to touch and monofilament testing bilaterally: Yes Pulse Check Posterior Tibialis and Dorsalis pulse intact bilaterally: Yes Comments    ASSESSMENT: 1. DM2, non-insulin-dependent, uncontrolled, without long term complications, but with hyperglycemia  2.  Overweight  3. HL  PLAN:  1. Patient with longstanding, previously uncontrolled type 2 diabetes, with improved control after adding an SGLT2 inhibitor.  At last visit, the majority of the blood sugars were at goal, with only occasional hyperglycemic values in the morning but these were mild and sparse and possibly related to his upper respiratory infection.  Later in the day, he was not checking blood sugars until bedtime, and these were also at goal with few lower exceptions.  We discussed about only using glipizide  with larger meals or if he had dessert after a meal, but otherwise, I did not recommend a change in his regimen. - At today's visit, sugars appear to be mostly at goal in the morning but with occasional mild hyperglycemic values especially after eating out.  He does so at least twice a week.  We discussed about trying to reduce eating out, but also to use the glipizide  more frequently.  He mentions that he takes the full tablet before a larger meal but we discussed about taking half a tablet whenever he is eating out, even if he is not planning to eat a very large meal.  Otherwise, we continued the rest  of the regimen.  I refilled his metformin  and glipizide . - I suggested to:  Patient Instructions  Please continue: - Metformin  ER 1500 mg at night - Glipizide  2.5-5 mg as needed before a larger meal -  Farxiga  10 mg before b'fast - Rybelsus  14 mg before b'fast  Please return in 4-6 months with your sugar log.   - we checked his HbA1c: 6.6% (a little higher) - advised to check sugars at different times of the day - 2x a day, rotating check times - advised for yearly eye exams >> he is UTD - will check annual labs today - return to clinic in 4-6 months   2.  Overweight - will continue the SGLT2 inhibitor and GLP-1 receptor agonist, which should also help with weight loss -he was previously not able to tolerate Rybelsus  due to GI symptoms, but we restarted this and he now tolerates it well.  Retrospectively, previous GI symptoms could have been related to his COVID-19 infection. - He lost few pounds before last visit but weight is ~ stable now  3. HL - His lipid panel was reviewed from the end of last year: LDL much improved, triglycerides elevated, HDL low: Lab Results  Component Value Date   CHOL 106 09/14/2023   HDL 26 (L) 09/14/2023   LDLCALC 46 09/14/2023   LDLDIRECT 114.0 07/03/2022   TRIG 208 (H) 09/14/2023   CHOLHDL 4.1 09/14/2023  - He is on Crestor  20 mg daily, fish oil 2000 mg twice a day and fenofibrate  145 mg daily.  He tolerates them well. - will check a lipid panel today-fasting  Lela Fendt, MD PhD [facility] Endocrinology

## 2024-06-14 ENCOUNTER — Ambulatory Visit: Payer: Self-pay | Admitting: Internal Medicine

## 2024-06-14 LAB — LIPID PANEL W/REFLEX DIRECT LDL
Cholesterol: 120 mg/dL (ref <200)
HDL: 27 mg/dL — ABNORMAL LOW (ref >=40)
LDL Cholesterol (Calc): 65 mg/dL
Non-HDL Cholesterol (Calc): 93 mg/dL (ref <130)
Total CHOL/HDL Ratio: 4.4 (calc) (ref <5.0)
Triglycerides: 212 mg/dL — ABNORMAL HIGH (ref <150)

## 2024-06-14 LAB — MICROALBUMIN / CREATININE URINE RATIO
Creatinine, Urine: 61 mg/dL (ref 20–320)
Microalb Creat Ratio: 20 mg/g{creat} (ref <30)
Microalb, Ur: 1.2 mg/dL

## 2024-06-14 LAB — COMPREHENSIVE METABOLIC PANEL WITH GFR
AG Ratio: 2.3 (calc) (ref 1.0–2.5)
ALT: 27 U/L (ref 9–46)
AST: 19 U/L (ref 10–35)
Albumin: 4.8 g/dL (ref 3.6–5.1)
Alkaline phosphatase (APISO): 46 U/L (ref 35–144)
BUN: 20 mg/dL (ref 7–25)
CO2: 25 mmol/L (ref 20–32)
Calcium: 10.2 mg/dL (ref 8.6–10.3)
Chloride: 104 mmol/L (ref 98–110)
Creat: 0.86 mg/dL (ref 0.70–1.28)
Globulin: 2.1 g/dL (ref 1.9–3.7)
Glucose, Bld: 147 mg/dL — ABNORMAL HIGH (ref 65–99)
Potassium: 4.1 mmol/L (ref 3.5–5.3)
Sodium: 139 mmol/L (ref 135–146)
Total Bilirubin: 0.5 mg/dL (ref 0.2–1.2)
Total Protein: 6.9 g/dL (ref 6.1–8.1)
eGFR: 93 mL/min/1.73m2 (ref >=60)

## 2024-06-15 ENCOUNTER — Ambulatory Visit: Attending: Cardiology | Admitting: Cardiology

## 2024-06-15 ENCOUNTER — Encounter: Payer: Self-pay | Admitting: Cardiology

## 2024-06-15 VITALS — BP 128/72 | HR 76 | Ht 73.0 in | Wt 196.2 lb

## 2024-06-15 DIAGNOSIS — I10 Essential (primary) hypertension: Secondary | ICD-10-CM | POA: Diagnosis not present

## 2024-06-15 DIAGNOSIS — E119 Type 2 diabetes mellitus without complications: Secondary | ICD-10-CM

## 2024-06-15 DIAGNOSIS — R0600 Dyspnea, unspecified: Secondary | ICD-10-CM

## 2024-06-15 DIAGNOSIS — E782 Mixed hyperlipidemia: Secondary | ICD-10-CM

## 2024-06-15 DIAGNOSIS — I25118 Atherosclerotic heart disease of native coronary artery with other forms of angina pectoris: Secondary | ICD-10-CM

## 2024-06-15 DIAGNOSIS — I340 Nonrheumatic mitral (valve) insufficiency: Secondary | ICD-10-CM | POA: Diagnosis not present

## 2024-06-15 DIAGNOSIS — R0609 Other forms of dyspnea: Secondary | ICD-10-CM

## 2024-06-15 NOTE — Patient Instructions (Signed)
 Medication Instructions:  Continue all medications *If you need a refill on your cardiac medications before your next appointment, please call your pharmacy*  Lab Work: None ordered  Testing/Procedures: Stress Myoview  Hold Glipizide  and metformin  morning of  Do Not eat or drink 3 hours before except you may have water Do Not drink caffeine or decaf 12 hours before Wear comfortable clothes and walking shoes No cologne or lotion   Follow-Up: At [facility], you and your health needs are our priority.  As part of our continuing mission to provide you with exceptional heart care, our providers are all part of one team.  This team includes your primary Cardiologist (physician) and Advanced Practice Providers or APPs (Physician Assistants and Nurse Practitioners) who all work together to provide you with the care you need, when you need it.  Your next appointment:  1 year   Call in April to schedule August appointment     Provider:  Dr.Jordan    We recommend signing up for the patient portal called MyChart.  Sign up information is provided on this After Visit Summary.  MyChart is used to connect with patients for Virtual Visits (Telemedicine).  Patients are able to view lab/test results, encounter notes, upcoming appointments, etc.  Non-urgent messages can be sent to your provider as well.   To learn more about what you can do with MyChart, go to [email or web address].

## 2024-06-17 ENCOUNTER — Other Ambulatory Visit: Payer: Self-pay | Admitting: Medical Genetics

## 2024-06-26 ENCOUNTER — Telehealth (HOSPITAL_COMMUNITY): Payer: Self-pay

## 2024-06-26 NOTE — Telephone Encounter (Signed)
 Detailed instructions left on the patient's answering machine. Asked to call back with any questions.  S.Derrick Brown CCT

## 2024-06-27 ENCOUNTER — Encounter (HOSPITAL_COMMUNITY): Payer: Self-pay

## 2024-06-30 ENCOUNTER — Other Ambulatory Visit: Payer: Self-pay | Admitting: Cardiology

## 2024-06-30 ENCOUNTER — Encounter: Payer: Self-pay | Admitting: Family Medicine

## 2024-06-30 DIAGNOSIS — I340 Nonrheumatic mitral (valve) insufficiency: Secondary | ICD-10-CM

## 2024-06-30 DIAGNOSIS — E782 Mixed hyperlipidemia: Secondary | ICD-10-CM

## 2024-06-30 DIAGNOSIS — E119 Type 2 diabetes mellitus without complications: Secondary | ICD-10-CM

## 2024-06-30 DIAGNOSIS — I10 Essential (primary) hypertension: Secondary | ICD-10-CM

## 2024-06-30 DIAGNOSIS — I25118 Atherosclerotic heart disease of native coronary artery with other forms of angina pectoris: Secondary | ICD-10-CM

## 2024-06-30 DIAGNOSIS — R0609 Other forms of dyspnea: Secondary | ICD-10-CM

## 2024-07-02 ENCOUNTER — Other Ambulatory Visit: Payer: Self-pay | Admitting: Family Medicine

## 2024-07-02 DIAGNOSIS — I10 Essential (primary) hypertension: Secondary | ICD-10-CM

## 2024-07-04 ENCOUNTER — Ambulatory Visit (HOSPITAL_COMMUNITY)
Admission: RE | Admit: 2024-07-04 | Discharge: 2024-07-04 | Disposition: A | Discharge status: Home / Self Care | Source: Ambulatory Visit | Attending: Cardiovascular Disease | Admitting: Cardiovascular Disease

## 2024-07-04 ENCOUNTER — Ambulatory Visit: Payer: Self-pay | Admitting: Cardiology

## 2024-07-04 DIAGNOSIS — E782 Mixed hyperlipidemia: Secondary | ICD-10-CM | POA: Diagnosis not present

## 2024-07-04 DIAGNOSIS — E119 Type 2 diabetes mellitus without complications: Secondary | ICD-10-CM | POA: Insufficient documentation

## 2024-07-04 DIAGNOSIS — R0609 Other forms of dyspnea: Secondary | ICD-10-CM | POA: Diagnosis not present

## 2024-07-04 DIAGNOSIS — I10 Essential (primary) hypertension: Secondary | ICD-10-CM | POA: Insufficient documentation

## 2024-07-04 DIAGNOSIS — I340 Nonrheumatic mitral (valve) insufficiency: Secondary | ICD-10-CM | POA: Diagnosis not present

## 2024-07-04 DIAGNOSIS — I25118 Atherosclerotic heart disease of native coronary artery with other forms of angina pectoris: Secondary | ICD-10-CM | POA: Diagnosis present

## 2024-07-04 LAB — MYOCARDIAL PERFUSION IMAGING
Base ST Depression (mm): 0 mm
LV dias vol: 114 mL (ref 62–150)
LV sys vol: 34 mL (ref 4.2–5.8)
Nuc Stress EF: 70 %
Peak HR: 87 {beats}/min
Rest HR: 57 {beats}/min
Rest Nuclear Isotope Dose: 10.8 mCi
SDS: 0
SRS: 1
SSS: 1
ST Depression (mm): 0 mm
Stress Nuclear Isotope Dose: 32.1 mCi
TID: 1.08

## 2024-07-04 MED ORDER — REGADENOSON 0.4 MG/5ML IV SOLN
INTRAVENOUS | Status: AC
  Filled 2024-07-04: qty 5

## 2024-07-04 MED ORDER — TECHNETIUM TC 99M TETROFOSMIN IV KIT
32.1000 | PACK | Freq: Once | INTRAVENOUS | Status: AC | PRN
  Administered 2024-07-04: 32.1 via INTRAVENOUS

## 2024-07-04 MED ORDER — TECHNETIUM TC 99M TETROFOSMIN IV KIT
10.8000 | PACK | Freq: Once | INTRAVENOUS | Status: AC | PRN
  Administered 2024-07-04: 10.8 via INTRAVENOUS

## 2024-07-04 MED ORDER — REGADENOSON 0.4 MG/5ML IV SOLN
0.4000 mg | Freq: Once | INTRAVENOUS | Status: AC
  Administered 2024-07-04: 0.4 mg via INTRAVENOUS

## 2024-07-05 DIAGNOSIS — M1712 Unilateral primary osteoarthritis, left knee: Secondary | ICD-10-CM | POA: Diagnosis not present

## 2024-07-06 ENCOUNTER — Encounter: Payer: Self-pay | Admitting: Family Medicine

## 2024-07-06 ENCOUNTER — Ambulatory Visit (INDEPENDENT_AMBULATORY_CARE_PROVIDER_SITE_OTHER): Payer: Commercial Managed Care - PPO | Admitting: Family Medicine

## 2024-07-06 VITALS — BP 120/48 | HR 62 | Temp 97.6°F | Ht 73.0 in | Wt 224.6 lb

## 2024-07-06 DIAGNOSIS — Z0000 Encounter for general adult medical examination without abnormal findings: Secondary | ICD-10-CM | POA: Diagnosis not present

## 2024-07-06 DIAGNOSIS — G4700 Insomnia, unspecified: Secondary | ICD-10-CM

## 2024-07-06 DIAGNOSIS — Z125 Encounter for screening for malignant neoplasm of prostate: Secondary | ICD-10-CM

## 2024-07-06 DIAGNOSIS — E782 Mixed hyperlipidemia: Secondary | ICD-10-CM | POA: Diagnosis not present

## 2024-07-06 DIAGNOSIS — Z79899 Other long term (current) drug therapy: Secondary | ICD-10-CM

## 2024-07-06 DIAGNOSIS — Z23 Encounter for immunization: Secondary | ICD-10-CM | POA: Diagnosis not present

## 2024-07-06 LAB — HM DIABETES EYE EXAM

## 2024-07-06 LAB — PSA, MEDICARE: PSA: 1.09 ng/mL (ref 0.10–4.00)

## 2024-07-06 MED ORDER — ROSUVASTATIN CALCIUM 20 MG PO TABS
20.0000 mg | ORAL_TABLET | Freq: Every day | ORAL | 3 refills | Status: AC
Start: 2024-07-06 — End: ?

## 2024-07-06 MED ORDER — TRAZODONE HCL 100 MG PO TABS
ORAL_TABLET | ORAL | 3 refills | Status: AC
Start: 2024-07-06 — End: ?

## 2024-07-06 NOTE — Progress Notes (Signed)
 Complete physical exam  Patient: Derrick Brown   DOB: August 20, 1951   73 y.o. Male  MRN: [id number]  Subjective:    Chief Complaint  Patient presents with   Medicare Wellness    Derrick Brown is a 73 y.o. male who presents today for a complete physical exam. He reports consuming a general diet. Home exercise routine includes walking 1-2  hrs per week. He generally feels well. He reports sleeping fairly well. He does not have additional problems to discuss today.    Most recent fall risk assessment:    07/06/2024    8:05 AM  Fall Risk   Falls in the past year? 0  Number falls in past yr: 0  Injury with Fall? 0  Risk for fall due to : No Fall Risks  Follow up Falls evaluation completed     Most recent depression screenings:    07/06/2024    8:05 AM 07/05/2023    8:02 AM  PHQ 2/9 Scores  PHQ - 2 Score 0 1  PHQ- 9 Score  3    Vision:Within last year and Dental: No current dental problems and Receives regular dental care  Patient Active Problem List   Diagnosis Date Noted   Nonrheumatic mitral valve regurgitation 03/12/2023   Class 1 obesity 11/28/2019   Cardiac murmur 07/04/2018   Overweight (BMI 25.0-29.9) 05/26/2017   Nuclear age-related cataract, both eyes 02/08/2017   Dysplastic nevus of left lower extremity 08/06/2014   Tinnitus, bilateral 10/24/2013   Sensorineural hearing loss (SNHL), bilateral 10/24/2013   History of colonic polyps 11/03/2012   Diabetes mellitus without complication (HCC) 07/07/2012   Actinic keratosis 04/15/2011   Testosterone  deficiency 04/10/2011   ED (erectile dysfunction) of organic origin 03/26/2011   INSOMNIA 04/02/2010   Temporomandibular joint disorder 06/10/2009   DYSPHAGIA 05/15/2009   ESOPHAGITIS, REFLUX 05/13/2009   COUGH DUE TO ACE INHIBITORS 04/12/2009   Hyperlipidemia 02/21/2009   ACNE ROSACEA 02/21/2009   Essential hypertension 02/21/2009      Patient Care Team: Ozell Heron HERO, MD as PCP - General (Family  Medicine) Nahser, Aleene PARAS, MD (Inactive) as PCP - Cardiology (Cardiology) Dermatology, Ruthellen Roz Anes, MD as Consulting Physician (Ophthalmology) [facility]   Outpatient Medications Prior to Visit  Medication Sig   amLODipine  (NORVASC ) 5 MG tablet TAKE 1 TABLET (5 MG TOTAL) BY MOUTH DAILY.   aspirin 81 MG tablet Take 81 mg by mouth daily.   betamethasone  dipropionate 0.05 % cream APPLY TO AFFECTED AREA TWICE A DAY   dapagliflozin  propanediol (FARXIGA ) 10 MG TABS tablet TAKE 1 TABLET BY MOUTH EVERY DAY BEFORE BREAKFAST   fenofibrate  (TRICOR ) 145 MG tablet TAKE 1 TABLET BY MOUTH EVERY DAY   glipiZIDE  (GLUCOTROL ) 5 MG tablet TAKE 1/2 TABLET AS NEEDED BEFORE A LARGER MEAL   losartan  (COZAAR ) 100 MG tablet TAKE 1 TABLET BY MOUTH EVERY DAY   metFORMIN  (GLUCOPHAGE -XR) 750 MG 24 hr tablet Take 1 tablet (750 mg total) by mouth 2 (two) times daily.   Multiple Vitamin (MULTIVITAMIN) tablet Take 1 tablet by mouth daily.   Omega-3 Fatty Acids (FISH OIL) 1000 MG CAPS Take 2,000 mg by mouth 2 (two) times daily.     OVER THE COUNTER MEDICATION Vitamin C 500 mg, one tablet daily.   OVER THE COUNTER MEDICATION Cinnamon 1000 mg one tablet daily.   OVER THE COUNTER MEDICATION CQ10 one tablet daily.   pantoprazole  (PROTONIX ) 40 MG tablet TAKE 1 TABLET BY MOUTH EVERY  DAY   RYBELSUS  14 MG TABS TAKE 1 TABLET (14 MG TOTAL) BY MOUTH DAILY BEFORE BREAKFAST.   Saw Palmetto 450 MG CAPS Take by mouth daily.   triamcinolone  cream (KENALOG ) 0.1 % Apply topically 2 (two) times daily as needed.   [DISCONTINUED] rosuvastatin  (CRESTOR ) 20 MG tablet Take 1 tablet (20 mg total) by mouth daily.   [DISCONTINUED] traZODone  (DESYREL ) 100 MG tablet TAKE 1 TABLET BY MOUTH EVERYDAY AT BEDTIME   No facility-administered medications prior to visit.    Review of Systems  HENT:  Negative for hearing loss.   Eyes:  Negative for blurred vision.  Respiratory:  Negative for shortness of breath.    Cardiovascular:  Negative for chest pain.  Gastrointestinal: Negative.   Genitourinary: Negative.   Musculoskeletal:  Negative for back pain.  Neurological:  Negative for headaches.  Psychiatric/Behavioral:  Negative for depression.        Objective:     BP (!) 120/48   Pulse 62   Temp 97.6 F (36.4 C) (Oral)   Ht 6' 1 (1.854 m)   Wt 224 lb 9.6 oz (101.9 kg)   BMI 29.63 kg/m    Physical Exam Vitals reviewed.  Constitutional:      Appearance: Normal appearance. He is well-groomed and normal weight.  HENT:     Right Ear: Tympanic membrane and ear canal normal.     Left Ear: Tympanic membrane and ear canal normal.     Mouth/Throat:     Mouth: Mucous membranes are moist.     Pharynx: No posterior oropharyngeal erythema.  Eyes:     Extraocular Movements: Extraocular movements intact.     Conjunctiva/sclera: Conjunctivae normal.  Neck:     Thyroid : No thyromegaly.  Cardiovascular:     Rate and Rhythm: Normal rate and regular rhythm.     Heart sounds: S1 normal and S2 normal. No murmur heard. Pulmonary:     Effort: Pulmonary effort is normal.     Breath sounds: Normal breath sounds and air entry. No rales.  Abdominal:     General: Abdomen is flat. Bowel sounds are normal.  Musculoskeletal:     Right lower leg: No edema.     Left lower leg: No edema.  Lymphadenopathy:     Cervical: No cervical adenopathy.  Neurological:     General: No focal deficit present.     Mental Status: He is alert and oriented to person, place, and time.     Gait: Gait is intact.  Psychiatric:        Mood and Affect: Mood and affect normal.      Results for orders placed or performed in visit on 07/06/24  HM DIABETES EYE EXAM  Result Value Ref Range   HM Diabetic Eye Exam No Retinopathy No Retinopathy       Assessment & Plan:    Routine Health Maintenance and Physical Exam  Immunization History  Administered Date(s) Administered    sv, Bivalent, Protein Subunit Rsvpref,pf  (Abrysvo) 01/22/2023, 06/21/2024   Fluad Quad(high Dose 65+) 07/02/2021, 07/03/2022   Fluad Trivalent(High Dose 65+) 07/05/2023   INFLUENZA, HIGH DOSE SEASONAL PF 07/04/2018, 07/06/2024   Influenza Split 07/07/2012   Influenza,inj,Quad PF,6+ Mos 06/29/2013, 07/20/2014, 07/26/2015, 07/28/2016, 06/30/2017   Influenza-Unspecified 06/20/2019, 07/12/2020   PFIZER(Purple Top)SARS-COV-2 Vaccination 11/23/2019, 12/19/2019, 07/15/2020   Pneumococcal Conjugate-13 08/06/2014, 06/30/2017   Pneumococcal Polysaccharide-23 07/04/2018   Td 12/10/2005   Tdap 08/04/2016   Zoster Recombinant(Shingrix ) 07/12/2020, 11/08/2020   Zoster, Live 10/24/2013  Health Maintenance  Topic Date Due   Medicare Annual Wellness (AWV)  Never done   COVID-19 Vaccine (4 - 2025-26 season) 06/19/2024   HEMOGLOBIN A1C  12/14/2024   Diabetic kidney evaluation - eGFR measurement  06/13/2025   Diabetic kidney evaluation - Urine ACR  06/13/2025   FOOT EXAM  06/13/2025   OPHTHALMOLOGY EXAM  07/06/2025   DTaP/Tdap/Td (3 - Td or Tdap) 08/04/2026   Colonoscopy  04/22/2028   Pneumococcal Vaccine: 50+ Years  Completed   Influenza Vaccine  Completed   Hepatitis C Screening  Completed   Zoster Vaccines- Shingrix   Completed   HPV VACCINES  Aged Out   Meningococcal B Vaccine  Aged Out    Discussed health benefits of physical activity, and encouraged him to engage in regular exercise appropriate for his age and condition.  Routine adult health maintenance  Immunization due -     Flu vaccine HIGH DOSE PF(Fluzone Trivalent)  Prostate cancer screening -     PSA, Medicare; Future  Mixed hyperlipidemia -     Rosuvastatin  Calcium ; Take 1 tablet (20 mg total) by mouth daily.  Dispense: 90 tablet; Refill: 3  Medication management -     Rosuvastatin  Calcium ; Take 1 tablet (20 mg total) by mouth daily.  Dispense: 90 tablet; Refill: 3  Insomnia, unspecified type -     traZODone  HCl; TAKE 1 TABLET BY MOUTH EVERYDAY AT BEDTIME   Dispense: 90 tablet; Refill: 3  General physical exam findings are normal today. I reviewed the patient's preventative testing, immunizations, and lifestyle habits. I made appropriate recommendations and placed orders for the appropriate tests and/or vaccinations. I counseled the patient on the CDC's recommendations for healthy exercise and diet. I counseled the patient on healthy sleep habits and stress management. Handouts to reinforce the counseling were given at the conclusion of the visit.    Return in about 1 year (around 07/06/2025).     Heron CHRISTELLA Sharper, MD

## 2024-07-07 ENCOUNTER — Ambulatory Visit: Payer: Self-pay | Admitting: Family Medicine

## 2024-08-08 ENCOUNTER — Encounter: Payer: Self-pay | Admitting: Internal Medicine

## 2024-08-09 ENCOUNTER — Other Ambulatory Visit (HOSPITAL_COMMUNITY): Payer: Self-pay

## 2024-08-09 ENCOUNTER — Telehealth: Payer: Self-pay

## 2024-08-09 ENCOUNTER — Telehealth: Payer: Self-pay | Admitting: Pharmacy Technician

## 2024-08-09 NOTE — Telephone Encounter (Signed)
Pt needs a PA for Rybelsus

## 2024-08-09 NOTE — Telephone Encounter (Signed)
 Pharmacy Patient Advocate Encounter   Received notification from CoverMyMeds that prior authorization for Rybelsus  14MG  tablets  is required/requested.   Insurance verification completed.   The patient is insured through [facility] ADVANT[employer]/RX ADVANCE.   Per test claim: PA required and submitted KEY/EOC/Request #: [id number] from 08/09/24 to 08/09/25. Ran test claim, Copay is $0.00. This test claim was processed through [facility]- copay amounts may vary at other pharmacies due to pharmacy/plan contracts, or as the patient moves through the different stages of their insurance plan.

## 2024-08-17 ENCOUNTER — Other Ambulatory Visit: Payer: Self-pay | Admitting: Medical Genetics

## 2024-08-17 DIAGNOSIS — Z006 Encounter for examination for normal comparison and control in clinical research program: Secondary | ICD-10-CM

## 2024-09-02 ENCOUNTER — Other Ambulatory Visit: Payer: Self-pay | Admitting: Family Medicine

## 2024-09-02 DIAGNOSIS — I10 Essential (primary) hypertension: Secondary | ICD-10-CM

## 2024-09-28 ENCOUNTER — Other Ambulatory Visit: Payer: Self-pay | Admitting: Family Medicine

## 2024-09-28 DIAGNOSIS — I10 Essential (primary) hypertension: Secondary | ICD-10-CM

## 2024-10-30 ENCOUNTER — Other Ambulatory Visit: Payer: Self-pay | Admitting: Internal Medicine

## 2024-11-01 ENCOUNTER — Encounter: Payer: Self-pay | Admitting: Family Medicine

## 2024-11-01 ENCOUNTER — Encounter: Payer: Self-pay | Admitting: Internal Medicine

## 2024-11-01 DIAGNOSIS — E78019 Familial hypercholesterolemia, unspecified: Secondary | ICD-10-CM

## 2024-11-01 NOTE — Telephone Encounter (Signed)
 Last Rx given by Dr Trixie

## 2024-11-06 MED ORDER — PANTOPRAZOLE SODIUM 40 MG PO TBEC: 40.0000 mg | DELAYED_RELEASE_TABLET | ORAL | 0 refills | Status: AC

## 2024-12-14 ENCOUNTER — Ambulatory Visit: Admitting: Internal Medicine

## 2025-07-09 ENCOUNTER — Encounter: Admitting: Family Medicine
